# Patient Record
Sex: Female | Born: 1950 | Race: White | Hispanic: No | Marital: Married | Smoking: Never smoker
Health system: Southern US, Community
[De-identification: ages and names within clinical notes are randomized; demographics above are authoritative.]

## PROBLEM LIST (undated history)

## (undated) DIAGNOSIS — F419 Anxiety disorder, unspecified: Secondary | ICD-10-CM

## (undated) DIAGNOSIS — F32A Depression, unspecified: Secondary | ICD-10-CM

## (undated) DIAGNOSIS — IMO0001 Reserved for inherently not codable concepts without codable children: Secondary | ICD-10-CM

## (undated) DIAGNOSIS — M8580 Other specified disorders of bone density and structure, unspecified site: Secondary | ICD-10-CM

## (undated) DIAGNOSIS — F329 Major depressive disorder, single episode, unspecified: Secondary | ICD-10-CM

## (undated) DIAGNOSIS — E663 Overweight: Secondary | ICD-10-CM

## (undated) DIAGNOSIS — R059 Cough, unspecified: Secondary | ICD-10-CM

## (undated) DIAGNOSIS — R05 Cough: Secondary | ICD-10-CM

## (undated) DIAGNOSIS — M797 Fibromyalgia: Secondary | ICD-10-CM

## (undated) DIAGNOSIS — M722 Plantar fascial fibromatosis: Secondary | ICD-10-CM

## (undated) DIAGNOSIS — B009 Herpesviral infection, unspecified: Secondary | ICD-10-CM

## (undated) DIAGNOSIS — T4145XA Adverse effect of unspecified anesthetic, initial encounter: Secondary | ICD-10-CM

## (undated) DIAGNOSIS — M1990 Unspecified osteoarthritis, unspecified site: Secondary | ICD-10-CM

## (undated) DIAGNOSIS — E785 Hyperlipidemia, unspecified: Secondary | ICD-10-CM

## (undated) DIAGNOSIS — K219 Gastro-esophageal reflux disease without esophagitis: Secondary | ICD-10-CM

## (undated) DIAGNOSIS — R251 Tremor, unspecified: Secondary | ICD-10-CM

## (undated) DIAGNOSIS — T8859XA Other complications of anesthesia, initial encounter: Secondary | ICD-10-CM

## (undated) HISTORY — PX: LUMBAR FUSION: SHX111

## (undated) HISTORY — PX: WRIST SURGERY: SHX841

## (undated) HISTORY — DX: Overweight: E66.3

## (undated) HISTORY — PX: BACK SURGERY: SHX140

## (undated) HISTORY — PX: BUNIONECTOMY: SHX129

## (undated) HISTORY — DX: Hyperlipidemia, unspecified: E78.5

## (undated) HISTORY — DX: Anxiety disorder, unspecified: F41.9

## (undated) HISTORY — DX: Unspecified osteoarthritis, unspecified site: M19.90

## (undated) HISTORY — DX: Other specified disorders of bone density and structure, unspecified site: M85.80

## (undated) HISTORY — DX: Fibromyalgia: M79.7

## (undated) HISTORY — DX: Plantar fascial fibromatosis: M72.2

## (undated) HISTORY — PX: DILATION AND CURETTAGE OF UTERUS: SHX78

## (undated) HISTORY — PX: JOINT REPLACEMENT: SHX530

## (undated) HISTORY — DX: Gastro-esophageal reflux disease without esophagitis: K21.9

## (undated) HISTORY — DX: Depression, unspecified: F32.A

## (undated) HISTORY — DX: Major depressive disorder, single episode, unspecified: F32.9

---

## 2005-04-13 ENCOUNTER — Emergency Department: Payer: Self-pay | Admitting: Emergency Medicine

## 2005-06-12 ENCOUNTER — Ambulatory Visit: Payer: Self-pay | Admitting: General Practice

## 2005-11-20 HISTORY — PX: TOTAL KNEE ARTHROPLASTY: SHX125

## 2006-01-19 ENCOUNTER — Emergency Department: Payer: Self-pay | Admitting: Emergency Medicine

## 2006-07-25 ENCOUNTER — Inpatient Hospital Stay (HOSPITAL_COMMUNITY): Admission: RE | Admit: 2006-07-25 | Discharge: 2006-07-28 | Payer: Self-pay | Admitting: Orthopedic Surgery

## 2006-08-14 ENCOUNTER — Encounter: Payer: Self-pay | Admitting: Orthopedic Surgery

## 2006-08-20 ENCOUNTER — Encounter: Payer: Self-pay | Admitting: Orthopedic Surgery

## 2006-09-20 ENCOUNTER — Encounter: Payer: Self-pay | Admitting: Orthopedic Surgery

## 2009-03-15 ENCOUNTER — Ambulatory Visit: Payer: Self-pay

## 2011-03-09 ENCOUNTER — Ambulatory Visit: Payer: Self-pay | Admitting: Pain Medicine

## 2011-03-17 ENCOUNTER — Ambulatory Visit: Payer: Self-pay | Admitting: Pain Medicine

## 2011-03-22 ENCOUNTER — Ambulatory Visit: Payer: Self-pay | Admitting: Pain Medicine

## 2011-03-28 ENCOUNTER — Ambulatory Visit: Payer: Self-pay | Admitting: Pain Medicine

## 2011-04-10 ENCOUNTER — Ambulatory Visit: Payer: Self-pay | Admitting: Pain Medicine

## 2011-04-24 ENCOUNTER — Ambulatory Visit: Payer: Self-pay | Admitting: Nephrology

## 2011-04-28 ENCOUNTER — Ambulatory Visit: Payer: Self-pay | Admitting: Gastroenterology

## 2011-06-01 ENCOUNTER — Ambulatory Visit: Payer: Self-pay | Admitting: Pain Medicine

## 2011-06-21 ENCOUNTER — Ambulatory Visit: Payer: Self-pay | Admitting: Pain Medicine

## 2011-06-27 ENCOUNTER — Ambulatory Visit: Payer: Self-pay | Admitting: Pain Medicine

## 2011-07-17 ENCOUNTER — Ambulatory Visit: Payer: Self-pay | Admitting: Pain Medicine

## 2011-08-15 ENCOUNTER — Other Ambulatory Visit (HOSPITAL_COMMUNITY): Payer: Self-pay | Admitting: Neurosurgery

## 2011-08-15 ENCOUNTER — Ambulatory Visit (HOSPITAL_COMMUNITY)
Admission: RE | Admit: 2011-08-15 | Discharge: 2011-08-15 | Disposition: A | Discharge status: Home / Self Care | Payer: Medicare Other | Source: Ambulatory Visit | Attending: Neurosurgery | Admitting: Neurosurgery

## 2011-08-15 ENCOUNTER — Encounter (HOSPITAL_COMMUNITY)
Admission: RE | Admit: 2011-08-15 | Discharge: 2011-08-15 | Disposition: A | Discharge status: Home / Self Care | Payer: Medicare Other | Source: Ambulatory Visit | Attending: Neurosurgery | Admitting: Neurosurgery

## 2011-08-15 DIAGNOSIS — R059 Cough, unspecified: Secondary | ICD-10-CM | POA: Insufficient documentation

## 2011-08-15 DIAGNOSIS — Z01811 Encounter for preprocedural respiratory examination: Secondary | ICD-10-CM | POA: Insufficient documentation

## 2011-08-15 DIAGNOSIS — M5126 Other intervertebral disc displacement, lumbar region: Secondary | ICD-10-CM | POA: Insufficient documentation

## 2011-08-15 DIAGNOSIS — M4316 Spondylolisthesis, lumbar region: Secondary | ICD-10-CM

## 2011-08-15 DIAGNOSIS — M48061 Spinal stenosis, lumbar region without neurogenic claudication: Secondary | ICD-10-CM

## 2011-08-15 DIAGNOSIS — Z01812 Encounter for preprocedural laboratory examination: Secondary | ICD-10-CM | POA: Insufficient documentation

## 2011-08-15 DIAGNOSIS — Z01810 Encounter for preprocedural cardiovascular examination: Secondary | ICD-10-CM | POA: Insufficient documentation

## 2011-08-15 DIAGNOSIS — R05 Cough: Secondary | ICD-10-CM | POA: Insufficient documentation

## 2011-08-15 LAB — CBC
HCT: 42.3 % (ref 36.0–46.0)
MCH: 26.6 pg (ref 26.0–34.0)
MCV: 79.7 fL (ref 78.0–100.0)
RDW: 14.6 % (ref 11.5–15.5)
WBC: 7.2 10*3/uL (ref 4.0–10.5)

## 2011-08-15 LAB — BASIC METABOLIC PANEL
BUN: 13 mg/dL (ref 6–23)
CO2: 32 mEq/L (ref 19–32)
Chloride: 98 mEq/L (ref 96–112)
Creatinine, Ser: 0.84 mg/dL (ref 0.50–1.10)

## 2011-08-23 ENCOUNTER — Inpatient Hospital Stay (HOSPITAL_COMMUNITY): Payer: Medicare Other

## 2011-08-23 ENCOUNTER — Inpatient Hospital Stay (HOSPITAL_COMMUNITY)
Admission: RE | Admit: 2011-08-23 | Discharge: 2011-08-27 | DRG: 460 | Disposition: A | Discharge status: Home / Self Care | Payer: Medicare Other | Source: Ambulatory Visit | Attending: Neurosurgery | Admitting: Neurosurgery

## 2011-08-23 DIAGNOSIS — M5137 Other intervertebral disc degeneration, lumbosacral region: Secondary | ICD-10-CM | POA: Diagnosis present

## 2011-08-23 DIAGNOSIS — M4310 Spondylolisthesis, site unspecified: Principal | ICD-10-CM | POA: Diagnosis present

## 2011-08-23 DIAGNOSIS — IMO0001 Reserved for inherently not codable concepts without codable children: Secondary | ICD-10-CM | POA: Diagnosis present

## 2011-08-23 DIAGNOSIS — M51379 Other intervertebral disc degeneration, lumbosacral region without mention of lumbar back pain or lower extremity pain: Secondary | ICD-10-CM | POA: Diagnosis present

## 2011-08-23 LAB — TYPE AND SCREEN: Antibody Screen: NEGATIVE

## 2011-08-25 NOTE — Op Note (Signed)
  Angela Martinez, Angela Martinez                   ACCOUNT NO.:  [id number]  MEDICAL RECORD NO.:  [id number]  LOCATION:  3034                         FACILITY:  [facility]  PHYSICIAN:  Hilda Lias, M.D.   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  CLINICAL HISTORY:  The patient was in my office because of back pain, radiation to both legs.  X-rays showed degenerative disk disease at multiple levels, but she has unstable spondylolisthesis at the level L4- 5.  The patient was not any better.  She had failed all the conservative treatment.  Surgery was advised.  PROCEDURE IN DETAIL:  The patient was taken to the OR, after intubation, she was positioned in a prone manner.  The back was cleaned with DuraPrep.  Midline incision was made through the skin and subcutaneous tissue all the way to the muscle with retraction laterally.  X-rays showed one clip was there on L3 and the other was at the level of L4. From then on, we removed the spinous process of L4 at the lamina.  Also, the facet was removed with essentially a Gill procedure.  Once it was done, we had to drill laterally, more than normal what we do to be able to get into the disk space.  Incision was made in the lateral, on the right side and then on the left side, and total gross diskectomy was achieved.  The endplate was removed.  Then, 2 cages with autograft of Vitoss were inserted first on the right side and then on the left side. Good position of the cages were seen.  From then on, using the C-arm in AP view and then in lateral view, we probed the pedicle L4 and L5, and we were able to introduce  four screws of 5.5 x 45.  Prior to introducing the screws, we were able to fill the holes, yet to be sure that we were surrounded by bone.  The screws were connected with the rod and kept in place with caps.  The cross-link from left-to-right was done.  We went laterally, and we removed the periosteum of the lateral aspect of the facet at 4-5 as well as the transverse process of 4-5.  A mix of Vitoss and autograft was used for arthrodesis.  There was small area of arachnoid pouch and a piece of DuraGen was left on top.  Valsalva maneuver up to 50 was negative.  From then on, the area was irrigated and closed with Vicryl and Steri-Strips.          ______________________________ Hilda Lias, M.D.     EB/MEDQ  D:  08/23/2011  T:  08/24/2011  Job:  [id number]  Electronically Signed by Hilda Lias M.D. on 08/25/2011 06:26:36 PM

## 2011-09-12 NOTE — Discharge Summary (Signed)
  Angela Martinez, Angela Martinez                   ACCOUNT NO.:  [id number]  MEDICAL RECORD NO.:  [id number]  LOCATION:  3034                         FACILITY:  [facility]  PHYSICIAN:  Hilda Lias, M.D.   DATE OF BIRTH:  [birth year]  DATE OF ADMISSION:  08/23/2011 DATE OF DISCHARGE:  08/27/2011                              DISCHARGE SUMMARY   ADMISSION DIAGNOSIS:  Lumbar stenosis with L4-5 spondylolisthesis.  FINAL DIAGNOSIS:  Lumbar stenosis with L4-5 spondylolisthesis.  CLINICAL HISTORY:  [name] is a 60 year old female complaining of back pain with radiation to both legs.  Because of the finding, surgery was advised.  Laboratory normal.  COURSE IN THE HOSPITAL:  She was taken to surgery and decompressive laminectomy at L4-5 was done, followed by fusion using cages and pedicle screws.  The patient did well, was ambulating, and she was discharged by Dr. Colon Branch on October 7.  MEDICATIONS:  Percocet and diazepam.  DIET:  Regular.ACTIVITY:  Not to drive, not to do any heavy lifting.  FOLLOWUP:  She is to be seen by me in 3 weeks.          ______________________________ Hilda Lias, M.D.     EB/MEDQ  D:  09/04/2011  T:  09/05/2011  Job:  [id number]  Electronically Signed by Hilda Lias M.D. on 09/12/2011 11:30:48 AM

## 2015-05-26 ENCOUNTER — Telehealth: Payer: Self-pay | Admitting: Family Medicine

## 2015-05-26 MED ORDER — OMEPRAZOLE 20 MG PO CPDR: 20.0000 mg | DELAYED_RELEASE_CAPSULE | Freq: Every day | ORAL | Status: DC

## 2015-05-26 NOTE — Telephone Encounter (Signed)
Called to notify patient that she needed an appointment, she already has one scheduled for July 20,2016.

## 2015-05-26 NOTE — Telephone Encounter (Signed)
E-Fax came through for refill: Rx: Omeprazole 20mg  Copy of Rx in basket

## 2015-05-26 NOTE — Telephone Encounter (Signed)
Forward to Dr.Johnson. 

## 2015-06-08 DIAGNOSIS — I10 Essential (primary) hypertension: Secondary | ICD-10-CM | POA: Insufficient documentation

## 2015-06-08 DIAGNOSIS — E785 Hyperlipidemia, unspecified: Secondary | ICD-10-CM | POA: Insufficient documentation

## 2015-06-08 DIAGNOSIS — M8580 Other specified disorders of bone density and structure, unspecified site: Secondary | ICD-10-CM | POA: Insufficient documentation

## 2015-06-08 DIAGNOSIS — E669 Obesity, unspecified: Secondary | ICD-10-CM | POA: Insufficient documentation

## 2015-06-08 DIAGNOSIS — F329 Major depressive disorder, single episode, unspecified: Secondary | ICD-10-CM | POA: Insufficient documentation

## 2015-06-08 DIAGNOSIS — F419 Anxiety disorder, unspecified: Secondary | ICD-10-CM | POA: Insufficient documentation

## 2015-06-08 DIAGNOSIS — K219 Gastro-esophageal reflux disease without esophagitis: Secondary | ICD-10-CM | POA: Insufficient documentation

## 2015-06-08 DIAGNOSIS — M810 Age-related osteoporosis without current pathological fracture: Secondary | ICD-10-CM | POA: Insufficient documentation

## 2015-06-08 DIAGNOSIS — M1990 Unspecified osteoarthritis, unspecified site: Secondary | ICD-10-CM | POA: Insufficient documentation

## 2015-06-08 DIAGNOSIS — E663 Overweight: Secondary | ICD-10-CM | POA: Insufficient documentation

## 2015-06-08 DIAGNOSIS — M797 Fibromyalgia: Secondary | ICD-10-CM | POA: Insufficient documentation

## 2015-06-08 DIAGNOSIS — F320 Major depressive disorder, single episode, mild: Secondary | ICD-10-CM | POA: Insufficient documentation

## 2015-06-09 ENCOUNTER — Ambulatory Visit (INDEPENDENT_AMBULATORY_CARE_PROVIDER_SITE_OTHER): Payer: PPO | Admitting: Family Medicine

## 2015-06-09 ENCOUNTER — Encounter: Payer: Self-pay | Admitting: Family Medicine

## 2015-06-09 VITALS — BP 120/80 | HR 75 | Temp 97.9°F | Ht 63.8 in | Wt 171.0 lb

## 2015-06-09 DIAGNOSIS — F329 Major depressive disorder, single episode, unspecified: Secondary | ICD-10-CM

## 2015-06-09 DIAGNOSIS — Z0000 Encounter for general adult medical examination without abnormal findings: Secondary | ICD-10-CM

## 2015-06-09 DIAGNOSIS — I10 Essential (primary) hypertension: Secondary | ICD-10-CM | POA: Diagnosis not present

## 2015-06-09 DIAGNOSIS — K219 Gastro-esophageal reflux disease without esophagitis: Secondary | ICD-10-CM

## 2015-06-09 DIAGNOSIS — F32A Depression, unspecified: Secondary | ICD-10-CM

## 2015-06-09 LAB — MICROSCOPIC EXAMINATION

## 2015-06-09 LAB — URINALYSIS, ROUTINE W REFLEX MICROSCOPIC
Bilirubin, UA: NEGATIVE
Glucose, UA: NEGATIVE
KETONES UA: NEGATIVE
Nitrite, UA: NEGATIVE
PH UA: 6 (ref 5.0–7.5)
PROTEIN UA: NEGATIVE
RBC, UA: NEGATIVE
SPEC GRAV UA: 1.01 (ref 1.005–1.030)
UUROB: 0.2 mg/dL (ref 0.2–1.0)

## 2015-06-09 MED ORDER — GABAPENTIN 300 MG PO CAPS: 300.0000 mg | ORAL_CAPSULE | Freq: Two times a day (BID) | ORAL | Status: DC

## 2015-06-09 MED ORDER — VALACYCLOVIR HCL 500 MG PO TABS: 500.0000 mg | ORAL_TABLET | Freq: Two times a day (BID) | ORAL | Status: DC

## 2015-06-09 MED ORDER — OMEPRAZOLE 20 MG PO CPDR: 20.0000 mg | DELAYED_RELEASE_CAPSULE | Freq: Every day | ORAL | Status: DC

## 2015-06-09 MED ORDER — FLUOXETINE HCL 20 MG PO TABS: 20.0000 mg | ORAL_TABLET | Freq: Every day | ORAL | Status: DC

## 2015-06-09 NOTE — Assessment & Plan Note (Signed)
The current medical regimen is effective;  continue present plan and medications.  

## 2015-06-09 NOTE — Progress Notes (Signed)
BP 120/80 mmHg  Pulse 75  Temp(Src) 97.9 F (36.6 C)  Ht 5' 3.8" (1.621 m)  Wt 171 lb (77.565 kg)  BMI 29.52 kg/m2  SpO2 99%   Subjective:    Patient ID: Angela Martinez, female    DOB: 1951/04/06, 64 y.o.   MRN: [id number]  HPI: Angela Martinez is a 64 y.o. female  Chief Complaint  Patient presents with  . Annual Exam    concerns about lumps in her head especially behind her right ear also some of her bones hurt Like both elbows and shoulders. Hands. Seems to be worse when she gets up in the morning but often on all day. Takes arthritis strength Tylenol with some relief. Patient with some left facial tic symptoms like a lightning strike going into her left cheek area Dr. Tami Ribas at [facility]  gave her prednisone which she has not taken. Pain is about gone with just slight presence. Depression doing well with Prozac. Takes gabapentin from time to time wants another prescription Takes Prilosec for reflux and nausea which is well controlled.   Relevant past medical, surgical, family and social history reviewed and updated as indicated. Interim medical history since our last visit reviewed. Allergies and medications reviewed and updated.  Review of Systems  Constitutional: Negative.   HENT: Negative.   Eyes: Negative.   Respiratory: Negative.   Cardiovascular: Negative.   Gastrointestinal: Negative.   Endocrine: Negative.   Genitourinary: Negative.   Musculoskeletal: Negative.   Skin: Negative.   Allergic/Immunologic: Negative.   Neurological: Negative.   Hematological: Negative.   Psychiatric/Behavioral: Negative.     Per HPI unless specifically indicated above     Objective:    BP 120/80 mmHg  Pulse 75  Temp(Src) 97.9 F (36.6 C)  Ht 5' 3.8" (1.621 m)  Wt 171 lb (77.565 kg)  BMI 29.52 kg/m2  SpO2 99%  Wt Readings from Last 3 Encounters:  06/09/15 171 lb (77.565 kg)  05/29/14 171 lb (77.565 kg)    Physical Exam  Constitutional: She is oriented to person,  place, and time. She appears well-developed and well-nourished.  HENT:  Head: Normocephalic and atraumatic.  Right Ear: External ear normal.  Left Ear: External ear normal.  Nose: Nose normal.  Mouth/Throat: Oropharynx is clear and moist.  Eyes: Conjunctivae and EOM are normal. Pupils are equal, round, and reactive to light.  Neck: Normal range of motion. Neck supple. Carotid bruit is not present.  Cardiovascular: Normal rate, regular rhythm and normal heart sounds.   No murmur heard. Pulmonary/Chest: Effort normal and breath sounds normal. Right breast exhibits no mass and no tenderness. Left breast exhibits no mass and no tenderness. Breasts are symmetrical.  Abdominal: Soft. Bowel sounds are normal. There is no hepatosplenomegaly.  Musculoskeletal: Normal range of motion.  Neurological: She is alert and oriented to person, place, and time.  Skin: No rash noted.   Behind patient's ear is a small fleshy mole  Psychiatric: She has a normal mood and affect. Her behavior is normal. Judgment and thought content normal.        Assessment & Plan:   Problem List Items Addressed This Visit      Cardiovascular and Mediastinum   Hypertension - Primary    Diet controled        Digestive   GERD (gastroesophageal reflux disease)    The current medical regimen is effective;  continue present plan and medications.       Relevant Medications  docusate sodium (COLACE) 50 MG capsule   omeprazole (PRILOSEC) 20 MG capsule     Other   Depression    The current medical regimen is effective;  continue present plan and medications.       Relevant Medications   gabapentin (NEURONTIN) 300 MG capsule   FLUoxetine (PROZAC) 20 MG tablet    Other Visit Diagnoses    PE (physical exam), annual        Relevant Orders    Comprehensive metabolic panel    CBC with Differential/Platelet    Urinalysis, Routine w reflex microscopic (not at [facility])    TSH    Lipid panel        Follow up  plan: Return in about 6 months (around 12/10/2015), or if symptoms worsen or fail to improve, for med check.

## 2015-06-09 NOTE — Assessment & Plan Note (Signed)
Diet controled 

## 2015-06-10 LAB — COMPREHENSIVE METABOLIC PANEL
ALBUMIN: 4.3 g/dL (ref 3.6–4.8)
ALT: 13 IU/L (ref 0–32)
AST: 18 IU/L (ref 0–40)
Albumin/Globulin Ratio: 1.6 (ref 1.1–2.5)
Alkaline Phosphatase: 57 IU/L (ref 39–117)
BUN/Creatinine Ratio: 15 (ref 11–26)
BUN: 15 mg/dL (ref 8–27)
Bilirubin Total: 0.6 mg/dL (ref 0.0–1.2)
CALCIUM: 9.6 mg/dL (ref 8.7–10.3)
CHLORIDE: 101 mmol/L (ref 97–108)
CO2: 24 mmol/L (ref 18–29)
Creatinine, Ser: 0.97 mg/dL (ref 0.57–1.00)
GFR calc Af Amer: 70 mL/min/{1.73_m2} (ref >59)
GFR calc non Af Amer: 61 mL/min/{1.73_m2} (ref >59)
GLOBULIN, TOTAL: 2.7 g/dL (ref 1.5–4.5)
Glucose: 83 mg/dL (ref 65–99)
POTASSIUM: 4.5 mmol/L (ref 3.5–5.2)
SODIUM: 142 mmol/L (ref 134–144)
Total Protein: 7 g/dL (ref 6.0–8.5)

## 2015-06-10 LAB — CBC WITH DIFFERENTIAL/PLATELET
BASOS ABS: 0 10*3/uL (ref 0.0–0.2)
BASOS: 0 %
EOS (ABSOLUTE): 0.2 10*3/uL (ref 0.0–0.4)
Eos: 2 %
Hematocrit: 41.6 % (ref 34.0–46.6)
Hemoglobin: 13.6 g/dL (ref 11.1–15.9)
IMMATURE GRANS (ABS): 0 10*3/uL (ref 0.0–0.1)
Immature Granulocytes: 0 %
LYMPHS ABS: 2.4 10*3/uL (ref 0.7–3.1)
Lymphs: 30 %
MCH: 25.8 pg — AB (ref 26.6–33.0)
MCHC: 32.7 g/dL (ref 31.5–35.7)
MCV: 79 fL (ref 79–97)
MONOS ABS: 0.5 10*3/uL (ref 0.1–0.9)
Monocytes: 6 %
NEUTROS PCT: 62 %
Neutrophils Absolute: 4.9 10*3/uL (ref 1.4–7.0)
Platelets: 298 10*3/uL (ref 150–379)
RBC: 5.27 x10E6/uL (ref 3.77–5.28)
RDW: 16.4 % — AB (ref 12.3–15.4)
WBC: 8 10*3/uL (ref 3.4–10.8)

## 2015-06-10 LAB — LIPID PANEL
CHOLESTEROL TOTAL: 238 mg/dL — AB (ref 100–199)
Chol/HDL Ratio: 5.3 ratio units — ABNORMAL HIGH (ref 0.0–4.4)
HDL: 45 mg/dL (ref >39)
LDL Calculated: 152 mg/dL — ABNORMAL HIGH (ref 0–99)
Triglycerides: 205 mg/dL — ABNORMAL HIGH (ref 0–149)
VLDL Cholesterol Cal: 41 mg/dL — ABNORMAL HIGH (ref 5–40)

## 2015-06-10 LAB — TSH: TSH: 3.45 u[IU]/mL (ref 0.450–4.500)

## 2015-06-10 NOTE — Progress Notes (Signed)
Phone call Discussed with patient elevated lipids patient will do better with diet and exercise recheck lipid panel next office visit

## 2015-06-15 ENCOUNTER — Other Ambulatory Visit: Payer: Self-pay | Admitting: Unknown Physician Specialty

## 2015-06-15 DIAGNOSIS — M792 Neuralgia and neuritis, unspecified: Secondary | ICD-10-CM

## 2015-06-15 DIAGNOSIS — R51 Headache: Secondary | ICD-10-CM

## 2015-06-15 DIAGNOSIS — R519 Headache, unspecified: Secondary | ICD-10-CM

## 2015-06-18 ENCOUNTER — Ambulatory Visit: Payer: Self-pay

## 2015-06-18 ENCOUNTER — Ambulatory Visit
Admission: RE | Admit: 2015-06-18 | Discharge: 2015-06-18 | Disposition: A | Discharge status: Home / Self Care | Payer: PPO | Source: Ambulatory Visit | Attending: Unknown Physician Specialty | Admitting: Unknown Physician Specialty

## 2015-06-18 DIAGNOSIS — R519 Headache, unspecified: Secondary | ICD-10-CM

## 2015-06-18 DIAGNOSIS — R51 Headache: Secondary | ICD-10-CM | POA: Insufficient documentation

## 2015-06-18 DIAGNOSIS — M792 Neuralgia and neuritis, unspecified: Secondary | ICD-10-CM

## 2015-06-18 MED ORDER — GADOBENATE DIMEGLUMINE 529 MG/ML IV SOLN
20.0000 mL | Freq: Once | INTRAVENOUS | Status: AC | PRN
  Administered 2015-06-18: 16 mL via INTRAVENOUS

## 2015-12-16 DIAGNOSIS — H2513 Age-related nuclear cataract, bilateral: Secondary | ICD-10-CM | POA: Diagnosis not present

## 2015-12-27 ENCOUNTER — Ambulatory Visit (INDEPENDENT_AMBULATORY_CARE_PROVIDER_SITE_OTHER): Payer: PPO | Admitting: Family Medicine

## 2015-12-27 ENCOUNTER — Encounter: Payer: Self-pay | Admitting: Family Medicine

## 2015-12-27 VITALS — BP 139/77 | HR 77 | Temp 97.9°F | Ht 63.3 in | Wt 171.0 lb

## 2015-12-27 DIAGNOSIS — F329 Major depressive disorder, single episode, unspecified: Secondary | ICD-10-CM | POA: Diagnosis not present

## 2015-12-27 DIAGNOSIS — F419 Anxiety disorder, unspecified: Secondary | ICD-10-CM | POA: Diagnosis not present

## 2015-12-27 DIAGNOSIS — F32A Depression, unspecified: Secondary | ICD-10-CM

## 2015-12-27 DIAGNOSIS — I10 Essential (primary) hypertension: Secondary | ICD-10-CM | POA: Diagnosis not present

## 2015-12-27 DIAGNOSIS — G500 Trigeminal neuralgia: Secondary | ICD-10-CM

## 2015-12-27 NOTE — Assessment & Plan Note (Signed)
The current medical regimen is effective;  continue present plan and medications.  

## 2015-12-27 NOTE — Progress Notes (Signed)
BP 139/77 mmHg  Pulse 77  Temp(Src) 97.9 F (36.6 C)  Ht 5' 3.3" (1.608 m)  Wt 171 lb (77.565 kg)  BMI 30.00 kg/m2  SpO2 98%   Subjective:    Patient ID: Angela Martinez, female    DOB: [birth year], 65 y.o.   MRN: [id number]  HPI: Angela Martinez is a 65 y.o. female  Chief Complaint  Patient presents with  . Fibromyalgia  . Hypertension  . lesion on right upper arm   Patient all in all doing well follow-up medications taking gabapentin for what sounds like trigeminal neuralgia. Patient is well controlled on gabapentin tried stopping gabapentin and symptoms came right back. Takes gabapentin without side effects Takes fluoxetine for nerves anxiety which does good control especially according to her husband We will continue this medication\ Patient on her right arm small cyst which is been present for 15 years with no significant change slight redness over the area of cyst will observe for now Relevant past medical, surgical, family and social history reviewed and updated as indicated. Interim medical history since our last visit reviewed. Allergies and medications reviewed and updated.  Review of Systems  Constitutional: Negative.   Respiratory: Negative.   Cardiovascular: Negative.     Per HPI unless specifically indicated above     Objective:    BP 139/77 mmHg  Pulse 77  Temp(Src) 97.9 F (36.6 C)  Ht 5' 3.3" (1.608 m)  Wt 171 lb (77.565 kg)  BMI 30.00 kg/m2  SpO2 98%  Wt Readings from Last 3 Encounters:  12/27/15 171 lb (77.565 kg)  06/09/15 171 lb (77.565 kg)  05/29/14 171 lb (77.565 kg)    Physical Exam  Constitutional: She is oriented to person, place, and time. She appears well-developed and well-nourished. No distress.  HENT:  Head: Normocephalic and atraumatic.  Right Ear: Hearing normal.  Left Ear: Hearing normal.  Nose: Nose normal.  Eyes: Conjunctivae and lids are normal. Right eye exhibits no discharge. Left eye exhibits no discharge. No scleral  icterus.  Cardiovascular: Normal rate, regular rhythm and normal heart sounds.   Pulmonary/Chest: Effort normal and breath sounds normal. No respiratory distress.  Musculoskeletal: Normal range of motion.  Neurological: She is alert and oriented to person, place, and time.  Skin: Skin is intact. No rash noted.  Psychiatric: She has a normal mood and affect. Her speech is normal and behavior is normal. Judgment and thought content normal. Cognition and memory are normal.    Results for orders placed or performed in visit on 06/09/15  Microscopic Examination  Result Value Ref Range   WBC, UA 0-5 0 -  5 /hpf   RBC, UA 0-2 0 -  2 /hpf   Epithelial Cells (non renal) 0-10 0 - 10 /hpf   Bacteria, UA Few None seen/Few  Comprehensive metabolic panel  Result Value Ref Range   Glucose 83 65 - 99 mg/dL   BUN 15 8 - 27 mg/dL   Creatinine, Ser 0.97 0.57 - 1.00 mg/dL   GFR calc non Af Amer 61 >59 mL/min/1.73   GFR calc Af Amer 70 >59 mL/min/1.73   BUN/Creatinine Ratio 15 11 - 26   Sodium 142 134 - 144 mmol/L   Potassium 4.5 3.5 - 5.2 mmol/L   Chloride 101 97 - 108 mmol/L   CO2 24 18 - 29 mmol/L   Calcium 9.6 8.7 - 10.3 mg/dL   Total Protein 7.0 6.0 - 8.5 g/dL   Albumin 4.3 3.6 -  4.8 g/dL   Globulin, Total 2.7 1.5 - 4.5 g/dL   Albumin/Globulin Ratio 1.6 1.1 - 2.5   Bilirubin Total 0.6 0.0 - 1.2 mg/dL   Alkaline Phosphatase 57 39 - 117 IU/L   AST 18 0 - 40 IU/L   ALT 13 0 - 32 IU/L  CBC with Differential/Platelet  Result Value Ref Range   WBC 8.0 3.4 - 10.8 x10E3/uL   RBC 5.27 3.77 - 5.28 x10E6/uL   Hemoglobin 13.6 11.1 - 15.9 g/dL   Hematocrit 41.6 34.0 - 46.6 %   MCV 79 79 - 97 fL   MCH 25.8 (L) 26.6 - 33.0 pg   MCHC 32.7 31.5 - 35.7 g/dL   RDW 16.4 (H) 12.3 - 15.4 %   Platelets 298 150 - 379 x10E3/uL   Neutrophils 62 %   Lymphs 30 %   Monocytes 6 %   Eos 2 %   Basos 0 %   Neutrophils Absolute 4.9 1.4 - 7.0 x10E3/uL   Lymphocytes Absolute 2.4 0.7 - 3.1 x10E3/uL   Monocytes  Absolute 0.5 0.1 - 0.9 x10E3/uL   EOS (ABSOLUTE) 0.2 0.0 - 0.4 x10E3/uL   Basophils Absolute 0.0 0.0 - 0.2 x10E3/uL   Immature Granulocytes 0 %   Immature Grans (Abs) 0.0 0.0 - 0.1 x10E3/uL  Urinalysis, Routine w reflex microscopic (not at [facility])  Result Value Ref Range   Specific Gravity, UA 1.010 1.005 - 1.030   pH, UA 6.0 5.0 - 7.5   Color, UA Yellow Yellow   Appearance Ur Clear Clear   Leukocytes, UA Trace (A) Negative   Protein, UA Negative Negative/Trace   Glucose, UA Negative Negative   Ketones, UA Negative Negative   RBC, UA Negative Negative   Bilirubin, UA Negative Negative   Urobilinogen, Ur 0.2 0.2 - 1.0 mg/dL   Nitrite, UA Negative Negative   Microscopic Examination See below:   TSH  Result Value Ref Range   TSH 3.450 0.450 - 4.500 uIU/mL  Lipid panel  Result Value Ref Range   Cholesterol, Total 238 (H) 100 - 199 mg/dL   Triglycerides 205 (H) 0 - 149 mg/dL   HDL 45 >39 mg/dL   VLDL Cholesterol Cal 41 (H) 5 - 40 mg/dL   LDL Calculated 152 (H) 0 - 99 mg/dL   Chol/HDL Ratio 5.3 (H) 0.0 - 4.4 ratio units      Assessment & Plan:   Problem List Items Addressed This Visit      Cardiovascular and Mediastinum   Essential hypertension - Primary    The current medical regimen is effective;  continue present plan and medications.         Nervous and Auditory   Trigeminal neuralgia of left side of face    The current medical regimen is effective;  continue present plan and medications.         Other   Depression    The current medical regimen is effective;  continue present plan and medications.       Anxiety    The current medical regimen is effective;  continue present plan and medications.           Follow up plan: Return in about 6 months (around 06/25/2016) for Physical Exam.

## 2016-02-15 ENCOUNTER — Telehealth: Payer: Self-pay | Admitting: Family Medicine

## 2016-02-15 ENCOUNTER — Other Ambulatory Visit: Payer: Self-pay

## 2016-02-15 DIAGNOSIS — Z1239 Encounter for other screening for malignant neoplasm of breast: Secondary | ICD-10-CM

## 2016-02-15 NOTE — Telephone Encounter (Signed)
Pt would like a order for mammogram

## 2016-02-17 DIAGNOSIS — H2513 Age-related nuclear cataract, bilateral: Secondary | ICD-10-CM | POA: Diagnosis not present

## 2016-02-23 DIAGNOSIS — Z1231 Encounter for screening mammogram for malignant neoplasm of breast: Secondary | ICD-10-CM | POA: Diagnosis not present

## 2016-02-23 LAB — HM MAMMOGRAPHY

## 2016-03-09 DIAGNOSIS — H2513 Age-related nuclear cataract, bilateral: Secondary | ICD-10-CM | POA: Diagnosis not present

## 2016-03-13 ENCOUNTER — Encounter: Payer: Self-pay | Admitting: *Deleted

## 2016-03-16 ENCOUNTER — Ambulatory Visit
Admission: RE | Admit: 2016-03-16 | Discharge: 2016-03-16 | Disposition: A | Discharge status: Home / Self Care | Payer: PPO | Source: Ambulatory Visit | Attending: Ophthalmology | Admitting: Ophthalmology

## 2016-03-16 ENCOUNTER — Encounter: Payer: Self-pay | Admitting: *Deleted

## 2016-03-16 ENCOUNTER — Encounter
Admission: RE | Disposition: A | Discharge status: Home / Self Care | Payer: Self-pay | Source: Ambulatory Visit | Attending: Ophthalmology

## 2016-03-16 ENCOUNTER — Ambulatory Visit: Payer: PPO | Admitting: Anesthesiology

## 2016-03-16 DIAGNOSIS — R251 Tremor, unspecified: Secondary | ICD-10-CM | POA: Insufficient documentation

## 2016-03-16 DIAGNOSIS — G500 Trigeminal neuralgia: Secondary | ICD-10-CM | POA: Diagnosis not present

## 2016-03-16 DIAGNOSIS — R0602 Shortness of breath: Secondary | ICD-10-CM | POA: Diagnosis not present

## 2016-03-16 DIAGNOSIS — H2511 Age-related nuclear cataract, right eye: Secondary | ICD-10-CM | POA: Insufficient documentation

## 2016-03-16 DIAGNOSIS — M797 Fibromyalgia: Secondary | ICD-10-CM | POA: Diagnosis not present

## 2016-03-16 DIAGNOSIS — M810 Age-related osteoporosis without current pathological fracture: Secondary | ICD-10-CM | POA: Insufficient documentation

## 2016-03-16 DIAGNOSIS — K219 Gastro-esophageal reflux disease without esophagitis: Secondary | ICD-10-CM | POA: Insufficient documentation

## 2016-03-16 DIAGNOSIS — R05 Cough: Secondary | ICD-10-CM | POA: Diagnosis not present

## 2016-03-16 DIAGNOSIS — F329 Major depressive disorder, single episode, unspecified: Secondary | ICD-10-CM | POA: Insufficient documentation

## 2016-03-16 DIAGNOSIS — I10 Essential (primary) hypertension: Secondary | ICD-10-CM | POA: Insufficient documentation

## 2016-03-16 DIAGNOSIS — M8580 Other specified disorders of bone density and structure, unspecified site: Secondary | ICD-10-CM | POA: Insufficient documentation

## 2016-03-16 DIAGNOSIS — Z96659 Presence of unspecified artificial knee joint: Secondary | ICD-10-CM | POA: Diagnosis not present

## 2016-03-16 DIAGNOSIS — E669 Obesity, unspecified: Secondary | ICD-10-CM | POA: Insufficient documentation

## 2016-03-16 DIAGNOSIS — E7800 Pure hypercholesterolemia, unspecified: Secondary | ICD-10-CM | POA: Insufficient documentation

## 2016-03-16 DIAGNOSIS — H2513 Age-related nuclear cataract, bilateral: Secondary | ICD-10-CM | POA: Diagnosis not present

## 2016-03-16 HISTORY — PX: CATARACT EXTRACTION W/PHACO: SHX586

## 2016-03-16 HISTORY — DX: Tremor, unspecified: R25.1

## 2016-03-16 HISTORY — DX: Reserved for inherently not codable concepts without codable children: IMO0001

## 2016-03-16 HISTORY — DX: Herpesviral infection, unspecified: B00.9

## 2016-03-16 SURGERY — PHACOEMULSIFICATION, CATARACT, WITH IOL INSERTION
Anesthesia: Monitor Anesthesia Care | Site: Eye | Laterality: Right | Wound class: Clean

## 2016-03-16 MED ORDER — HYALURONIDASE HUMAN 150 UNIT/ML IJ SOLN
INTRAMUSCULAR | Status: AC
  Filled 2016-03-16: qty 1

## 2016-03-16 MED ORDER — SODIUM CHLORIDE 0.9 % IV SOLN
INTRAVENOUS | Status: DC
  Administered 2016-03-16: 09:00:00 via INTRAVENOUS

## 2016-03-16 MED ORDER — POVIDONE-IODINE 5 % OP SOLN
OPHTHALMIC | Status: AC
  Filled 2016-03-16: qty 30

## 2016-03-16 MED ORDER — CEFUROXIME OPHTHALMIC INJECTION 1 MG/0.1 ML
INJECTION | OPHTHALMIC | Status: DC | PRN
Start: 2016-03-16 — End: 2016-03-16
  Administered 2016-03-16: 0.1 mL via INTRACAMERAL

## 2016-03-16 MED ORDER — LIDOCAINE HCL (PF) 4 % IJ SOLN
INTRAMUSCULAR | Status: AC
Start: 2016-03-16 — End: 2016-03-16
  Filled 2016-03-16: qty 5

## 2016-03-16 MED ORDER — CEFUROXIME OPHTHALMIC INJECTION 1 MG/0.1 ML
INJECTION | OPHTHALMIC | Status: AC
  Filled 2016-03-16: qty 0.1

## 2016-03-16 MED ORDER — NA HYALUR & NA CHOND-NA HYALUR 0.55-0.5 ML IO KIT
PACK | INTRAOCULAR | Status: AC
  Filled 2016-03-16: qty 1.05

## 2016-03-16 MED ORDER — TETRACAINE HCL 0.5 % OP SOLN
1.0000 [drp] | Freq: Once | OPHTHALMIC | Status: AC
  Administered 2016-03-16: 1 [drp] via OPHTHALMIC

## 2016-03-16 MED ORDER — ARMC OPHTHALMIC DILATING GEL
1.0000 "application " | OPHTHALMIC | Status: DC | PRN
  Administered 2016-03-16: 1 via OPHTHALMIC

## 2016-03-16 MED ORDER — BUPIVACAINE HCL (PF) 0.75 % IJ SOLN
INTRAMUSCULAR | Status: AC
  Filled 2016-03-16: qty 10

## 2016-03-16 MED ORDER — EPINEPHRINE HCL 1 MG/ML IJ SOLN
INTRAMUSCULAR | Status: AC
  Filled 2016-03-16: qty 1

## 2016-03-16 MED ORDER — TETRACAINE HCL 0.5 % OP SOLN
OPHTHALMIC | Status: AC
  Filled 2016-03-16: qty 2

## 2016-03-16 MED ORDER — MIDAZOLAM HCL 2 MG/2ML IJ SOLN
INTRAMUSCULAR | Status: DC | PRN
  Administered 2016-03-16: 1 mg via INTRAVENOUS

## 2016-03-16 MED ORDER — MOXIFLOXACIN HCL 0.5 % OP SOLN
OPHTHALMIC | Status: AC
  Filled 2016-03-16: qty 3

## 2016-03-16 MED ORDER — CARBACHOL 0.01 % IO SOLN
INTRAOCULAR | Status: DC | PRN
  Administered 2016-03-16: 0.5 mL via INTRAOCULAR

## 2016-03-16 MED ORDER — EPINEPHRINE HCL 1 MG/ML IJ SOLN
INTRAMUSCULAR | Status: DC | PRN
  Administered 2016-03-16: 10:00:00 via OPHTHALMIC

## 2016-03-16 MED ORDER — NEOMYCIN-POLYMYXIN-DEXAMETH 0.1 % OP OINT
TOPICAL_OINTMENT | OPHTHALMIC | Status: DC | PRN
  Administered 2016-03-16: 1 via OPHTHALMIC

## 2016-03-16 MED ORDER — POVIDONE-IODINE 5 % OP SOLN
1.0000 "application " | Freq: Once | OPHTHALMIC | Status: AC
  Administered 2016-03-16: 1 via OPHTHALMIC

## 2016-03-16 MED ORDER — LIDOCAINE HCL (PF) 4 % IJ SOLN
INTRAMUSCULAR | Status: AC
  Filled 2016-03-16: qty 5

## 2016-03-16 MED ORDER — ARMC OPHTHALMIC DILATING GEL
OPHTHALMIC | Status: AC
  Filled 2016-03-16: qty 0.25

## 2016-03-16 MED ORDER — NA HYALUR & NA CHOND-NA HYALUR 0.4-0.35 ML IO KIT
PACK | INTRAOCULAR | Status: DC | PRN
  Administered 2016-03-16: .35 mL via INTRAOCULAR

## 2016-03-16 MED ORDER — MOXIFLOXACIN HCL 0.5 % OP SOLN: 1.0000 [drp] | OPHTHALMIC | Status: DC | PRN

## 2016-03-16 MED ORDER — LIDOCAINE HCL (PF) 4 % IJ SOLN
INTRAOCULAR | Status: DC | PRN
  Administered 2016-03-16: 10:00:00 via OPHTHALMIC

## 2016-03-16 SURGICAL SUPPLY — 23 items
CANNULA ANT/CHMB 27G (MISCELLANEOUS) ×1 IMPLANT
CANNULA ANT/CHMB 27GA (MISCELLANEOUS) ×2 IMPLANT
CUP MEDICINE 2OZ PLAST GRAD ST (MISCELLANEOUS) ×2 IMPLANT
GLOVE BIO SURGEON STRL SZ8 (GLOVE) ×2 IMPLANT
GLOVE BIOGEL M 6.5 STRL (GLOVE) ×2 IMPLANT
GLOVE SURG LX 7.5 STRW (GLOVE) ×1
GLOVE SURG LX STRL 7.5 STRW (GLOVE) ×1 IMPLANT
GOWN STRL REUS W/ TWL LRG LVL3 (GOWN DISPOSABLE) ×2 IMPLANT
GOWN STRL REUS W/TWL LRG LVL3 (GOWN DISPOSABLE) ×4
LENS IOL TECNIS 20.0 (Intraocular Lens) ×2 IMPLANT
LENS IOL TECNIS MONO 1P 20.0 (Intraocular Lens) IMPLANT
PACK CATARACT (MISCELLANEOUS) ×2 IMPLANT
PACK CATARACT BRASINGTON LX (MISCELLANEOUS) ×2 IMPLANT
PACK EYE AFTER SURG (MISCELLANEOUS) ×2 IMPLANT
SOL BSS BAG (MISCELLANEOUS) ×2
SOL PREP PVP 2OZ (MISCELLANEOUS) ×2
SOLUTION BSS BAG (MISCELLANEOUS) ×1 IMPLANT
SOLUTION PREP PVP 2OZ (MISCELLANEOUS) ×1 IMPLANT
SYR 3ML LL SCALE MARK (SYRINGE) ×2 IMPLANT
SYR 5ML LL (SYRINGE) ×2 IMPLANT
SYR TB 1ML 27GX1/2 LL (SYRINGE) ×2 IMPLANT
WATER STERILE IRR 1000ML POUR (IV SOLUTION) ×2 IMPLANT
WIPE NON LINTING 3.25X3.25 (MISCELLANEOUS) ×2 IMPLANT

## 2016-03-16 NOTE — Transfer of Care (Signed)
Immediate Anesthesia Transfer of Care Note  Patient: Angela Martinez  Procedure(s) Performed: Procedure(s) with comments: CATARACT EXTRACTION PHACO AND INTRAOCULAR LENS PLACEMENT (IOC) (Right) - [place] 52.4 AP% 8.8 CDE 4.63 Fluid Pack Lot # [id number]  Patient Location: Short Stay  Anesthesia Type:MAC  Level of Consciousness: awake, alert  and oriented  Airway & Oxygen Therapy: Patient Spontanous Breathing and Patient connected to nasal cannula oxygen  Post-op Assessment: Report given to RN and Post -op Vital signs reviewed and stable  Post vital signs: Reviewed and stable  Last Vitals: 1012 - 97.8 temp 66 hr 100% sat 153/51 18 resp  Filed Vitals:   03/16/16 0826 03/16/16 1011  BP: 151/82 153/51  Pulse: 72 70  Temp: 36.7 C 36.6 C  Resp: 16 16    Last Pain: There were no vitals filed for this visit.       Complications: No apparent anesthesia complications

## 2016-03-16 NOTE — Discharge Instructions (Signed)
AMBULATORY SURGERY  DISCHARGE INSTRUCTIONS   1) The drugs that you were given will stay in your system until tomorrow so for the next 24 hours you should not:  A) Drive an automobile B) Make any legal decisions C) Drink any alcoholic beverage   2) You may resume regular meals tomorrow.  Today it is better to start with liquids and gradually work up to solid foods.  You may eat anything you prefer, but it is better to start with liquids, then soup and crackers, and gradually work up to solid foods.   3) Please notify your doctor immediately if you have any unusual bleeding, trouble breathing, redness and pain at the surgery site, drainage, fever, or pain not relieved by medication.    4) Additional Instructions:        Please contact your physician with any problems or Same Day Surgery at [phone], Monday through Friday 6 am to 4 pm, or [place] [facility] number at [phone].    Eye Surgery Discharge Instructions  Expect mild scratchy sensation or mild soreness. DO NOT RUB YOUR EYE!  The day of surgery:  Minimal physical activity, but bed rest is not required  No reading, computer work, or close hand work  No bending, lifting, or straining.  May watch TV  For 24 hours:  No driving, legal decisions, or alcoholic beverages  Safety precautions  Eat anything you prefer: It is better to start with liquids, then soup then solid foods.  _____ Eye patch should be worn until postoperative exam tomorrow.  ____ Solar shield eyeglasses should be worn for comfort in the sunlight/patch while sleeping  Resume all regular medications including aspirin or Coumadin if these were discontinued prior to surgery. You may shower, bathe, shave, or wash your hair. Tylenol may be taken for mild discomfort.  Call your doctor if you experience significant pain, nausea, or vomiting, fever > 101 or other signs of infection. [phone] or [phone] Specific  instructions:  Follow-up Information    Follow up with Leandrew Koyanagi, MD In 1 day.   Specialty:  Ophthalmology   Why:  Nicholaus Corolla at 10:45am   Contact information:   [address]   [place] [address] [phone]

## 2016-03-16 NOTE — Anesthesia Postprocedure Evaluation (Signed)
Anesthesia Post Note  Patient: Eilish J Quam  Procedure(s) Performed: Procedure(s) (LRB): CATARACT EXTRACTION PHACO AND INTRAOCULAR LENS PLACEMENT (IOC) (Right)  Patient location during evaluation: Short Stay Anesthesia Type: MAC Level of consciousness: awake and alert and oriented Pain management: pain level controlled Vital Signs Assessment: post-procedure vital signs reviewed and stable Respiratory status: spontaneous breathing Cardiovascular status: stable Postop Assessment: no signs of nausea or vomiting Anesthetic complications: no    Last Vitals:  Filed Vitals:   03/16/16 0826 03/16/16 1011  BP: 151/82 153/51  Pulse: 72 70  Temp: 36.7 C 36.6 C  Resp: 16 16    Last Pain: There were no vitals filed for this visit.               Delaney Meigs

## 2016-03-16 NOTE — Anesthesia Preprocedure Evaluation (Signed)
Anesthesia Evaluation  Patient identified by MRN, date of birth, ID band Patient awake    Reviewed: Allergy & Precautions, H&P , NPO status , Patient's Chart, lab work & pertinent test results, reviewed documented beta blocker date and time   History of Anesthesia Complications Negative for: history of anesthetic complications  Airway Mallampati: III  TM Distance: >3 FB Neck ROM: full    Dental no notable dental hx. (+) Caps, Missing, Teeth Intact   Pulmonary neg pulmonary ROS,    Pulmonary exam normal breath sounds clear to auscultation       Cardiovascular Exercise Tolerance: Good hypertension, (-) angina(-) CAD, (-) Past MI, (-) Cardiac Stents and (-) CABG Normal cardiovascular exam(-) dysrhythmias (-) Valvular Problems/Murmurs Rhythm:regular Rate:Normal     Neuro/Psych PSYCHIATRIC DISORDERS (Depression)  Neuromuscular disease (fibromyalgia and trigeminal neuralgia)    GI/Hepatic Neg liver ROS, GERD  ,  Endo/Other  negative endocrine ROS  Renal/GU negative Renal ROS  negative genitourinary   Musculoskeletal   Abdominal   Peds  Hematology negative hematology ROS (+)   Anesthesia Other Findings Past Medical History:   Fibromyalgia                                                 OA (osteoarthritis)                                          Osteopenia                                                   Hyperlipidemia                                               Overweight                                                   GERD (gastroesophageal reflux disease)                       Anxiety                                                      Depression                                                   Plantar fasciitis  Shortness of breath dyspnea                                    Comment:DOE   Hypertension                                                 Tremors of nervous  system                                    Herpes simplex                                                 Comment:1 AND 2   Reproductive/Obstetrics negative OB ROS                             Anesthesia Physical Anesthesia Plan  ASA: II  Anesthesia Plan: MAC   Post-op Pain Management:    Induction:   Airway Management Planned:   Additional Equipment:   Intra-op Plan:   Post-operative Plan:   Informed Consent: I have reviewed the patients History and Physical, chart, labs and discussed the procedure including the risks, benefits and alternatives for the proposed anesthesia with the patient or authorized representative who has indicated his/her understanding and acceptance.   Dental Advisory Given  Plan Discussed with: Anesthesiologist, CRNA and Surgeon  Anesthesia Plan Comments:         Anesthesia Quick Evaluation

## 2016-03-16 NOTE — H&P (Signed)
  The History and Physical notes are on paper, have been signed, and are to be scanned. The patient remains stable and unchanged from the H&P.   Previous H&P reviewed, patient examined, and there are no changes.  Angela Martinez 03/16/2016 8:42 AM

## 2016-03-16 NOTE — Op Note (Signed)
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 Item Serial No. Manufacturer Lot No. LRB No. Used  LENS IOL TECNIS 20.0 - [id number] Intraocular Lens LENS IOL TECNIS 20.0 [id number]   Right 1       ULTRASOUND TIME: 9 %  of 0 minutes 52 seconds, CDE 4.6  SURGEON:  Wyonia Hough, MD   ANESTHESIA:  Topical with tetracaine drops and 2% Xylocaine jelly, augmented with 1% preservative-free intracameral lidocaine.    COMPLICATIONS:  None.   DESCRIPTION OF PROCEDURE:  The patient was identified in the holding room and transported to the operating room and placed in the supine position under the operating microscope. Theright eye was identified as the operative eye and it was prepped and draped in the usual sterile ophthalmic fashion.   A 1 millimeter clear-corneal paracentesis was made at the 12:00 position.  0.5 ml of preservative-free 1% lidocaine was injected into the anterior chamber. The anterior chamber was filled with Viscoat viscoelastic.  A 2.4 millimeter keratome was used to make a near-clear corneal incision at the 9:00 position. A curvilinear capsulorrhexis was made with a cystotome and capsulorrhexis forceps.  Balanced salt solution was used to hydrodissect and hydrodelineate the nucleus.   Phacoemulsification was then used in stop and chop fashion to remove the lens nucleus and epinucleus.  The remaining cortex was then removed using the irrigation and aspiration handpiece. Provisc was then placed into the capsular bag to distend it for lens placement.  A lens was then injected into the capsular bag.  The remaining viscoelastic was aspirated.  Wounds were hydrated with balanced salt  solution.  The anterior chamber was inflated to a physiologic pressure with balanced salt solution. Cefuroxime 0.1 ml of a 10mg /ml solution was injected into the anterior chamber for a dose of 1 mg of intracameral antibiotic at the completion of the case. Miostat was placed into the anterior chamber to constrict the pupil.  No wound leaks were noted.  Topical Vigamox drops and Maxitrol ointment were applied to the eye.  The patient was taken to the recovery room in stable condition without complications of anesthesia or surgery.  Kensli Bowley 03/16/2016, 10:07 AM

## 2016-04-12 ENCOUNTER — Encounter: Payer: Self-pay | Admitting: Ophthalmology

## 2016-05-10 DIAGNOSIS — H2512 Age-related nuclear cataract, left eye: Secondary | ICD-10-CM | POA: Diagnosis not present

## 2016-05-15 ENCOUNTER — Encounter: Payer: Self-pay | Admitting: *Deleted

## 2016-05-18 ENCOUNTER — Encounter: Payer: Self-pay | Admitting: *Deleted

## 2016-05-18 ENCOUNTER — Ambulatory Visit: Payer: PPO | Admitting: Anesthesiology

## 2016-05-18 ENCOUNTER — Encounter
Admission: RE | Disposition: A | Discharge status: Home / Self Care | Payer: Self-pay | Source: Ambulatory Visit | Attending: Ophthalmology

## 2016-05-18 ENCOUNTER — Ambulatory Visit
Admission: RE | Admit: 2016-05-18 | Discharge: 2016-05-18 | Disposition: A | Discharge status: Home / Self Care | Payer: PPO | Source: Ambulatory Visit | Attending: Ophthalmology | Admitting: Ophthalmology

## 2016-05-18 DIAGNOSIS — K219 Gastro-esophageal reflux disease without esophagitis: Secondary | ICD-10-CM | POA: Diagnosis not present

## 2016-05-18 DIAGNOSIS — G500 Trigeminal neuralgia: Secondary | ICD-10-CM | POA: Diagnosis not present

## 2016-05-18 DIAGNOSIS — F329 Major depressive disorder, single episode, unspecified: Secondary | ICD-10-CM | POA: Diagnosis not present

## 2016-05-18 DIAGNOSIS — E785 Hyperlipidemia, unspecified: Secondary | ICD-10-CM | POA: Diagnosis not present

## 2016-05-18 DIAGNOSIS — H2512 Age-related nuclear cataract, left eye: Secondary | ICD-10-CM | POA: Diagnosis not present

## 2016-05-18 DIAGNOSIS — G709 Myoneural disorder, unspecified: Secondary | ICD-10-CM | POA: Diagnosis not present

## 2016-05-18 DIAGNOSIS — F418 Other specified anxiety disorders: Secondary | ICD-10-CM | POA: Diagnosis not present

## 2016-05-18 DIAGNOSIS — E663 Overweight: Secondary | ICD-10-CM | POA: Insufficient documentation

## 2016-05-18 DIAGNOSIS — M797 Fibromyalgia: Secondary | ICD-10-CM | POA: Insufficient documentation

## 2016-05-18 DIAGNOSIS — I10 Essential (primary) hypertension: Secondary | ICD-10-CM | POA: Insufficient documentation

## 2016-05-18 DIAGNOSIS — M1990 Unspecified osteoarthritis, unspecified site: Secondary | ICD-10-CM | POA: Insufficient documentation

## 2016-05-18 DIAGNOSIS — F419 Anxiety disorder, unspecified: Secondary | ICD-10-CM | POA: Insufficient documentation

## 2016-05-18 HISTORY — DX: Cough, unspecified: R05.9

## 2016-05-18 HISTORY — DX: Other complications of anesthesia, initial encounter: T88.59XA

## 2016-05-18 HISTORY — PX: CATARACT EXTRACTION W/PHACO: SHX586

## 2016-05-18 HISTORY — DX: Adverse effect of unspecified anesthetic, initial encounter: T41.45XA

## 2016-05-18 HISTORY — DX: Cough: R05

## 2016-05-18 SURGERY — PHACOEMULSIFICATION, CATARACT, WITH IOL INSERTION
Anesthesia: Monitor Anesthesia Care | Site: Eye | Laterality: Left | Wound class: Clean

## 2016-05-18 MED ORDER — POVIDONE-IODINE 5 % OP SOLN
1.0000 "application " | Freq: Once | OPHTHALMIC | Status: AC
  Administered 2016-05-18: 1 via OPHTHALMIC

## 2016-05-18 MED ORDER — EPINEPHRINE HCL 1 MG/ML IJ SOLN
INTRAMUSCULAR | Status: AC
  Filled 2016-05-18: qty 1

## 2016-05-18 MED ORDER — MOXIFLOXACIN HCL 0.5 % OP SOLN: 1.0000 [drp] | OPHTHALMIC | Status: DC | PRN

## 2016-05-18 MED ORDER — CARBACHOL 0.01 % IO SOLN
INTRAOCULAR | Status: DC | PRN
  Administered 2016-05-18: 0.5 mL via INTRAOCULAR

## 2016-05-18 MED ORDER — POVIDONE-IODINE 5 % OP SOLN
OPHTHALMIC | Status: AC
  Administered 2016-05-18: 1 via OPHTHALMIC
  Filled 2016-05-18: qty 30

## 2016-05-18 MED ORDER — EPINEPHRINE HCL 1 MG/ML IJ SOLN
INTRAMUSCULAR | Status: DC | PRN
  Administered 2016-05-18: 08:00:00 via OPHTHALMIC

## 2016-05-18 MED ORDER — TETRACAINE HCL 0.5 % OP SOLN
OPHTHALMIC | Status: AC
  Administered 2016-05-18: 1 [drp] via OPHTHALMIC
  Filled 2016-05-18: qty 2

## 2016-05-18 MED ORDER — NEOMYCIN-POLYMYXIN-DEXAMETH 3.5-10000-0.1 OP OINT
TOPICAL_OINTMENT | OPHTHALMIC | Status: AC
  Filled 2016-05-18: qty 3.5

## 2016-05-18 MED ORDER — SODIUM CHLORIDE 0.9 % IV SOLN
INTRAVENOUS | Status: DC
  Administered 2016-05-18: 07:00:00 via INTRAVENOUS

## 2016-05-18 MED ORDER — CEFUROXIME OPHTHALMIC INJECTION 1 MG/0.1 ML
INJECTION | OPHTHALMIC | Status: DC | PRN
Start: 2016-05-18 — End: 2016-05-18
  Administered 2016-05-18: 0.1 mL via INTRACAMERAL

## 2016-05-18 MED ORDER — TETRACAINE HCL 0.5 % OP SOLN
1.0000 [drp] | OPHTHALMIC | Status: AC | PRN
  Administered 2016-05-18: 1 [drp] via OPHTHALMIC

## 2016-05-18 MED ORDER — CEFUROXIME OPHTHALMIC INJECTION 1 MG/0.1 ML
INJECTION | OPHTHALMIC | Status: AC
  Filled 2016-05-18: qty 0.1

## 2016-05-18 MED ORDER — MOXIFLOXACIN HCL 0.5 % OP SOLN
OPHTHALMIC | Status: AC
  Filled 2016-05-18: qty 3

## 2016-05-18 MED ORDER — MIDAZOLAM HCL 2 MG/2ML IJ SOLN
INTRAMUSCULAR | Status: DC | PRN
  Administered 2016-05-18: 1.5 mg via INTRAVENOUS

## 2016-05-18 MED ORDER — NA HYALUR & NA CHOND-NA HYALUR 0.55-0.5 ML IO KIT
PACK | INTRAOCULAR | Status: AC
  Filled 2016-05-18: qty 1.05

## 2016-05-18 MED ORDER — NA HYALUR & NA CHOND-NA HYALUR 0.4-0.35 ML IO KIT
PACK | INTRAOCULAR | Status: DC | PRN
Start: 2016-05-18 — End: 2016-05-18
  Administered 2016-05-18: .35 mL via INTRAOCULAR

## 2016-05-18 MED ORDER — BSS IO SOLN
INTRAOCULAR | Status: DC | PRN
  Administered 2016-05-18: 08:00:00 via OPHTHALMIC

## 2016-05-18 MED ORDER — LIDOCAINE HCL (PF) 4 % IJ SOLN
INTRAMUSCULAR | Status: AC
  Filled 2016-05-18: qty 5

## 2016-05-18 MED ORDER — BUPIVACAINE HCL (PF) 0.75 % IJ SOLN
INTRAMUSCULAR | Status: AC
  Filled 2016-05-18: qty 10

## 2016-05-18 MED ORDER — ARMC OPHTHALMIC DILATING GEL
OPHTHALMIC | Status: AC
  Administered 2016-05-18: 1 via OPHTHALMIC
  Filled 2016-05-18: qty 0.25

## 2016-05-18 MED ORDER — HYALURONIDASE HUMAN 150 UNIT/ML IJ SOLN
INTRAMUSCULAR | Status: AC
  Filled 2016-05-18: qty 1

## 2016-05-18 MED ORDER — ARMC OPHTHALMIC DILATING GEL
1.0000 "application " | OPHTHALMIC | Status: AC | PRN
  Administered 2016-05-18 (×2): 1 via OPHTHALMIC

## 2016-05-18 MED ORDER — NEOMYCIN-POLYMYXIN-DEXAMETH 0.1 % OP OINT
TOPICAL_OINTMENT | OPHTHALMIC | Status: DC | PRN
  Administered 2016-05-18: 1 via OPHTHALMIC

## 2016-05-18 SURGICAL SUPPLY — 22 items
CANNULA ANT/CHMB 27G (MISCELLANEOUS) ×1 IMPLANT
CANNULA ANT/CHMB 27GA (MISCELLANEOUS) ×2 IMPLANT
CUP MEDICINE 2OZ PLAST GRAD ST (MISCELLANEOUS) ×2 IMPLANT
GLOVE BIO SURGEON STRL SZ8 (GLOVE) ×2 IMPLANT
GLOVE BIOGEL M 6.5 STRL (GLOVE) ×2 IMPLANT
GLOVE SURG LX 7.5 STRW (GLOVE) ×1
GLOVE SURG LX STRL 7.5 STRW (GLOVE) ×1 IMPLANT
GOWN STRL REUS W/ TWL LRG LVL3 (GOWN DISPOSABLE) ×2 IMPLANT
GOWN STRL REUS W/TWL LRG LVL3 (GOWN DISPOSABLE) ×4
LENS IOL TECNIS ITEC 22.5 (Intraocular Lens) ×1 IMPLANT
PACK CATARACT (MISCELLANEOUS) ×2 IMPLANT
PACK CATARACT BRASINGTON LX (MISCELLANEOUS) ×2 IMPLANT
PACK EYE AFTER SURG (MISCELLANEOUS) ×2 IMPLANT
SOL BSS BAG (MISCELLANEOUS) ×2
SOL PREP PVP 2OZ (MISCELLANEOUS) ×2
SOLUTION BSS BAG (MISCELLANEOUS) ×1 IMPLANT
SOLUTION PREP PVP 2OZ (MISCELLANEOUS) ×1 IMPLANT
SYR 3ML LL SCALE MARK (SYRINGE) ×2 IMPLANT
SYR 5ML LL (SYRINGE) ×2 IMPLANT
SYR TB 1ML 27GX1/2 LL (SYRINGE) ×2 IMPLANT
WATER STERILE IRR 1000ML POUR (IV SOLUTION) ×2 IMPLANT
WIPE NON LINTING 3.25X3.25 (MISCELLANEOUS) ×2 IMPLANT

## 2016-05-18 NOTE — Discharge Instructions (Signed)
AMBULATORY SURGERY  DISCHARGE INSTRUCTIONS   1) The drugs that you were given will stay in your system until tomorrow so for the next 24 hours you should not:  A) Drive an automobile B) Make any legal decisions C) Drink any alcoholic beverage   2) You may resume regular meals tomorrow.  Today it is better to start with liquids and gradually work up to solid foods.  You may eat anything you prefer, but it is better to start with liquids, then soup and crackers, and gradually work up to solid foods.   3) Please notify your doctor immediately if you have any unusual bleeding, trouble breathing, redness and pain at the surgery site, drainage, fever, or pain not relieved by medication.    4) Additional Instructions:    Eye Surgery Discharge Instructions  Expect mild scratchy sensation or mild soreness. DO NOT RUB YOUR EYE!  The day of surgery:  Minimal physical activity, but bed rest is not required  No reading, computer work, or close hand work  No bending, lifting, or straining.  May watch TV  For 24 hours:  No driving, legal decisions, or alcoholic beverages  Safety precautions  Eat anything you prefer: It is better to start with liquids, then soup then solid foods.  _____ Eye patch should be worn until postoperative exam tomorrow.  ____ Solar shield eyeglasses should be worn for comfort in the sunlight/patch while sleeping  Resume all regular medications including aspirin or Coumadin if these were discontinued prior to surgery. You may shower, bathe, shave, or wash your hair. Tylenol may be taken for mild discomfort.  Call your doctor if you experience significant pain, nausea, or vomiting, fever > 101 or other signs of infection. [phone] or [phone] Specific instructions:  Follow-up Information    Follow up with Leandrew Koyanagi, MD.   Specialty:  Ophthalmology   Why:  June 29 at 3:40pm   Contact information:   [address]   [place] [address] [phone]         Please contact your physician with any problems or Same Day Surgery at [phone], Monday through Friday 6 am to 4 pm, or [name] [facility] number at [phone].

## 2016-05-18 NOTE — Transfer of Care (Signed)
Immediate Anesthesia Transfer of Care Note  Patient: Angela Martinez  Procedure(s) Performed: Procedure(s) with comments: CATARACT EXTRACTION PHACO AND INTRAOCULAR LENS PLACEMENT (IOC) (Left) - [place] 47.3 AP% 10.4 CDE 4.90 Fluid pack lot # [id number]  Patient Location: PACU  Anesthesia Type:MAC  Level of Consciousness: awake  Airway & Oxygen Therapy: Patient Spontanous Breathing  Post-op Assessment: Report given to RN and Post -op Vital signs reviewed and stable  Post vital signs: Reviewed and stable  Last Vitals:  Filed Vitals:   05/18/16 0702  BP: 146/85  Pulse: 70  Temp: 35.6 C  Resp: 18    Last Pain: There were no vitals filed for this visit.       Complications: No apparent anesthesia complications

## 2016-05-18 NOTE — Anesthesia Postprocedure Evaluation (Signed)
Anesthesia Post Note  Patient: Angela Martinez  Procedure(s) Performed: Procedure(s) (LRB): CATARACT EXTRACTION PHACO AND INTRAOCULAR LENS PLACEMENT (IOC) (Left)  Patient location during evaluation: PACU Anesthesia Type: MAC Level of consciousness: awake Pain management: pain level controlled Vital Signs Assessment: post-procedure vital signs reviewed and stable Respiratory status: spontaneous breathing Cardiovascular status: blood pressure returned to baseline Postop Assessment: no headache Anesthetic complications: no    Last Vitals:  Filed Vitals:   05/18/16 0702  BP: 146/85  Pulse: 70  Temp: 35.6 C  Resp: 18    Last Pain: There were no vitals filed for this visit.               Buckner Malta

## 2016-05-18 NOTE — Op Note (Signed)
OPERATIVE NOTE  Angela Martinez [id number] 05/18/2016   PREOPERATIVE DIAGNOSIS:  Nuclear sclerotic cataract left eye. H25.12   POSTOPERATIVE DIAGNOSIS:    Nuclear sclerotic cataract left eye.     PROCEDURE:  Phacoemusification with posterior chamber intraocular lens placement of the left eye   LENS:   Implant Name Type Inv. Item Serial No. Manufacturer Lot No. LRB No. Used  LENS IOL DIOP 22.5 - [id number] Intraocular Lens LENS IOL DIOP 22.5 [id number]   Left 1        ULTRASOUND TIME: 10 % of 0 minutes, 47 seconds.  CDE 4.9   SURGEON:  Wyonia Hough, MD   ANESTHESIA:  Topical with tetracaine drops and 2% Xylocaine jelly, augmented with 1% preservative-free intracameral lidocaine.    COMPLICATIONS:  None.   DESCRIPTION OF PROCEDURE:  The patient was identified in the holding room and transported to the operating room and placed in the supine position under the operating microscope.  The left eye was identified as the operative eye and it was prepped and draped in the usual sterile ophthalmic fashion.   A 1 millimeter clear-corneal paracentesis was made at the 1:30 position. 0.5 ml of preservative-free 1% lidocaine was injected into the anterior chamber.  The anterior chamber was filled with Viscoat viscoelastic.  A 2.4 millimeter keratome was used to make a near-clear corneal incision at the 10:30 position.  .  A curvilinear capsulorrhexis was made with a cystotome and capsulorrhexis forceps.  Balanced salt solution was used to hydrodissect and hydrodelineate the nucleus.   Phacoemulsification was then used in stop and chop fashion to remove the lens nucleus and epinucleus.  The remaining cortex was then removed using the irrigation and aspiration handpiece. Provisc was then placed into the capsular bag to distend it for lens placement.  A lens was then injected into the capsular bag.  The remaining viscoelastic was aspirated.   Wounds were hydrated with balanced salt  solution.  The anterior chamber was inflated to a physiologic pressure with balanced salt solution. Cefuroxime 0.1 ml of a 10mg /ml solution was injected into the anterior chamber for a dose of 1 mg of intracameral antibiotic at the completion of the case.  Miostat was placed into the anterior chamber to constrict the pupil.  No wound leaks were noted.  Topical Vigamox drops and Maxitrol ointment were applied to the eye.  The patient was taken to the recovery room in stable condition without complications of anesthesia or surgery  [name] 05/18/2016, 8:28 AM

## 2016-05-18 NOTE — Anesthesia Preprocedure Evaluation (Signed)
Anesthesia Evaluation  Patient identified by MRN, date of birth, ID band Patient awake    Reviewed: Allergy & Precautions, H&P , NPO status , Patient's Chart, lab work & pertinent test results, reviewed documented beta blocker date and time   History of Anesthesia Complications Negative for: history of anesthetic complications  Airway Mallampati: III  TM Distance: >3 FB Neck ROM: limited    Dental no notable dental hx. (+) Caps, Missing, Teeth Intact   Pulmonary neg pulmonary ROS,    Pulmonary exam normal breath sounds clear to auscultation       Cardiovascular Exercise Tolerance: Good hypertension, (-) angina(-) CAD, (-) Past MI, (-) Cardiac Stents and (-) CABG Normal cardiovascular exam(-) dysrhythmias (-) Valvular Problems/Murmurs Rhythm:regular Rate:Normal     Neuro/Psych PSYCHIATRIC DISORDERS (Depression) Anxiety Depression  Neuromuscular disease (fibromyalgia and trigeminal neuralgia)    GI/Hepatic Neg liver ROS, GERD  Controlled,  Endo/Other  negative endocrine ROS  Renal/GU negative Renal ROS  negative genitourinary   Musculoskeletal  (+) Arthritis , Fibromyalgia -  Abdominal   Peds  Hematology negative hematology ROS (+)   Anesthesia Other Findings Past Medical History:   Fibromyalgia                                                 OA (osteoarthritis)                                          Osteopenia                                                   Hyperlipidemia                                               Overweight                                                   GERD (gastroesophageal reflux disease)                       Anxiety                                                      Depression                                                   Plantar fasciitis  Shortness of breath dyspnea                                    Comment:DOE   Hypertension                                                  Tremors of nervous system                                    Herpes simplex                                                 Comment:1 AND 2   Reproductive/Obstetrics negative OB ROS                             Anesthesia Physical  Anesthesia Plan  ASA: III  Anesthesia Plan: MAC   Post-op Pain Management:    Induction:   Airway Management Planned:   Additional Equipment:   Intra-op Plan:   Post-operative Plan:   Informed Consent: I have reviewed the patients History and Physical, chart, labs and discussed the procedure including the risks, benefits and alternatives for the proposed anesthesia with the patient or authorized representative who has indicated his/her understanding and acceptance.   Dental Advisory Given  Plan Discussed with: Anesthesiologist, CRNA and Surgeon  Anesthesia Plan Comments:         Anesthesia Quick Evaluation

## 2016-05-18 NOTE — H&P (Signed)
  The History and Physical notes are on paper, have been signed, and are to be scanned. The patient remains stable and unchanged from the H&P.   Previous H&P reviewed, patient examined, and there are no changes.  Angela Martinez 05/18/2016 7:59 AM

## 2016-05-18 NOTE — Anesthesia Procedure Notes (Signed)
Procedure Name: MAC Date/Time: 05/18/2016 7:46 AM Performed by: Allean Found Pre-anesthesia Checklist: Patient identified, Emergency Drugs available, Suction available, Patient being monitored and Timeout performed Patient Re-evaluated:Patient Re-evaluated prior to inductionOxygen Delivery Method: Nasal cannula Intubation Type: IV induction

## 2016-05-24 MED FILL — Moxifloxacin HCl Ophth Soln 0.5% (Base Equiv): OPHTHALMIC | Qty: 3 | Status: AC

## 2016-05-30 IMAGING — MR MR HEAD WO/W CM
13 series · 44 of 48 positions shown · IV contrast (multihance)
Comparison: None.

CLINICAL DATA: Headache and left-sided sinus and facial pain which
began after dental cleaning 1 month ago. Pain radiates to the left
cheek, nose and thigh.

EXAM:
MRI HEAD WITHOUT AND WITH CONTRAST
TECHNIQUE: Multiplanar, multiecho pulse sequences of the brain and surrounding
structures were obtained without and with intravenous contrast.
CONTRAST:  16mL MULTIHANCE GADOBENATE DIMEGLUMINE 529 MG/ML IV SOLN

[Series 2: T1 · sagittal · 5.0mm · 0.45mm/px · 4 of 29 slices shown (1 of 2)]
[im 1/29]
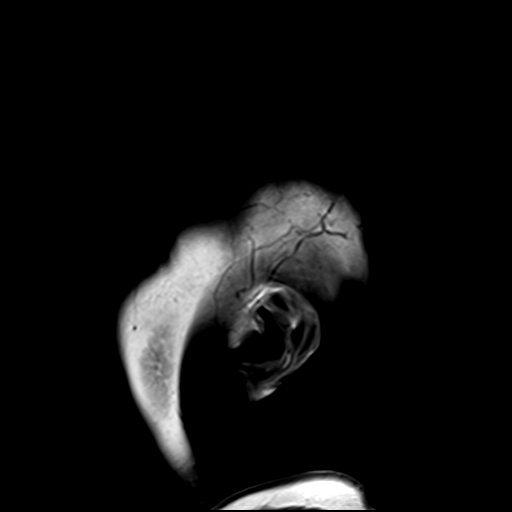
[im 10/29]
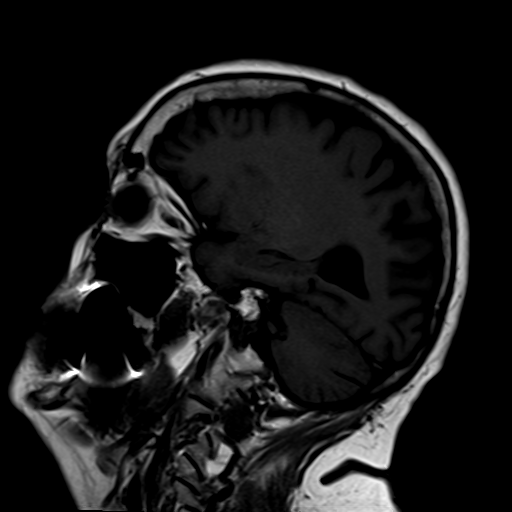
[im 19/29]
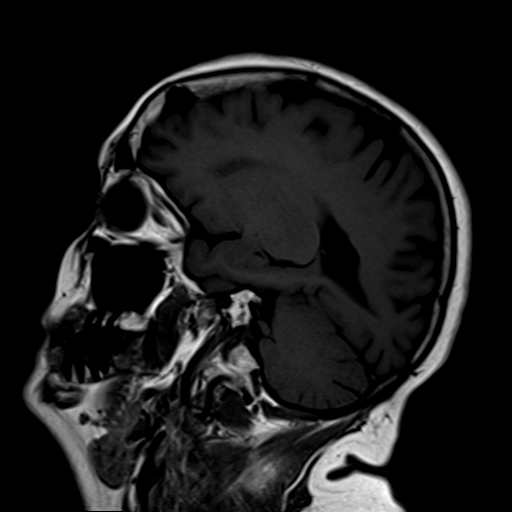
[im 29/29]
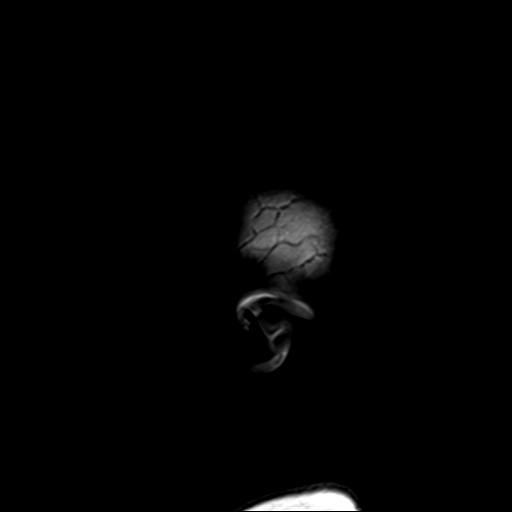

[Series 4: DWI · axial · 3.0mm · 1.80mm/px · z∈[-43,+103]mm · 6 of 56 slices shown (1 of 4)]
[im 1/56]
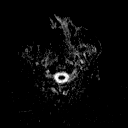
[im 12/56]
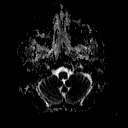
[im 23/56]
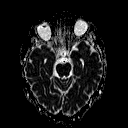
[im 34/56]
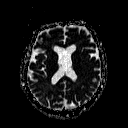
[im 45/56]
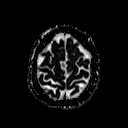
[im 56/56]
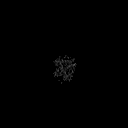

[Series 6: DWI · coronal · 3.0mm · 1.80mm/px · 4 of 47 slices shown (2 of 4)]
[im 1/47]
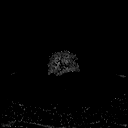
[im 16/47]
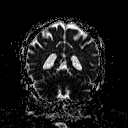
[im 31/47]
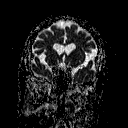
[im 47/47]
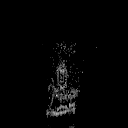

[Series 7: T2 · axial · 5.0mm · 0.60mm/px · z∈[-50,+106]mm · 3 of 28 slices shown (1 of 2)]
[im 1/28]
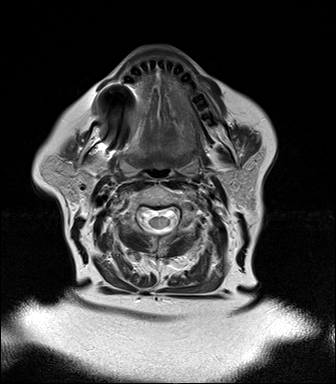
[im 14/28]
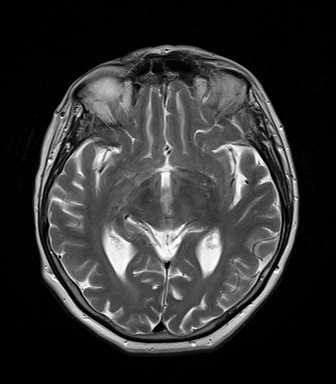
[im 28/28]
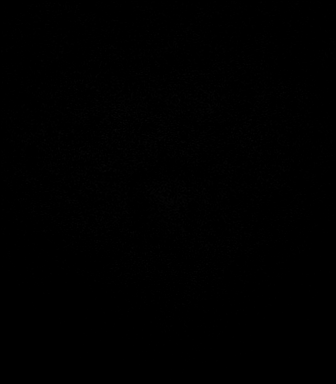

[Series 8: FLAIR · axial · 5.0mm · 0.45mm/px · z∈[-29,+107]mm · 2 of 26 slices shown (1 of 2)]
[im 1/26]
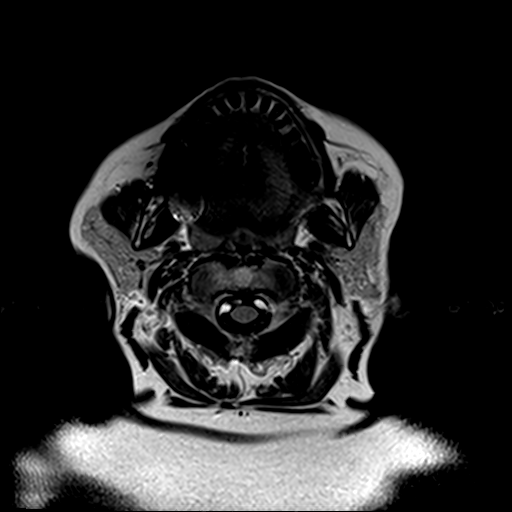
[im 26/26]
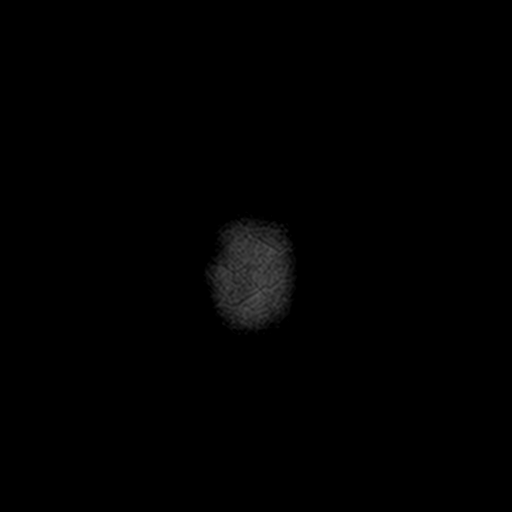

[Series 9: T2 · axial · 5.0mm · 0.45mm/px · z∈[-29,+107]mm · 2 of 26 slices shown (2 of 2)]
[im 1/26]
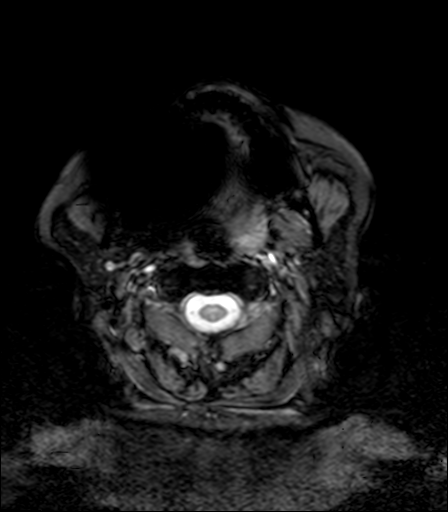
[im 26/26]
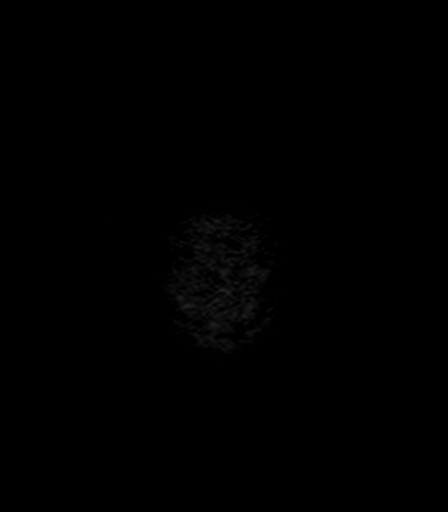

[Series 10: FLAIR · sagittal · 5.0mm · 0.43mm/px · 2 of 26 slices shown (2 of 2)]
[im 1/26]
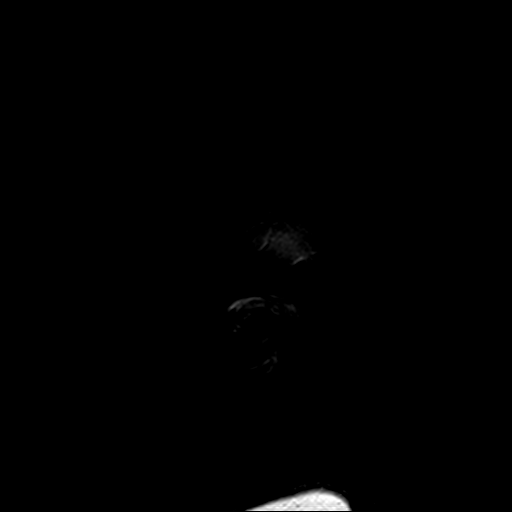
[im 26/26]
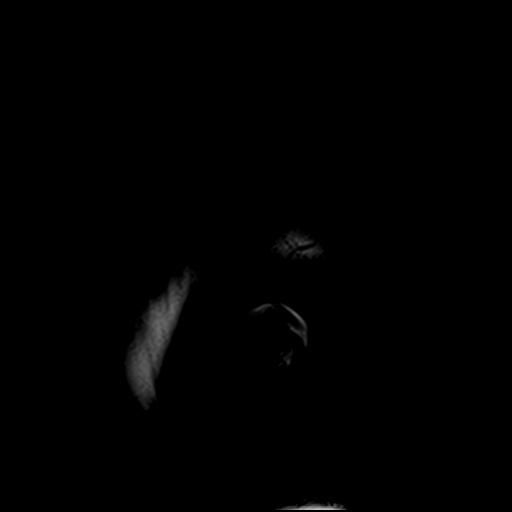

[Series 11: T1 · axial · 3.0mm · 1.00mm/px · 1 of 52 slices shown (2 of 2)]
[im 1/52]
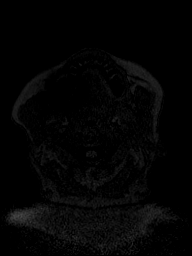

[Series 12: T2 post-contrast · coronal · 5.0mm · 0.49mm/px · 3 of 29 slices shown]
[im 1/29]
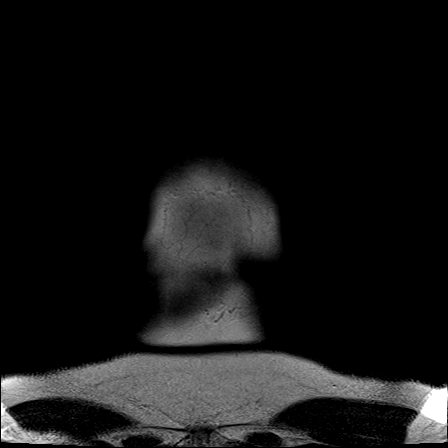
[im 15/29]
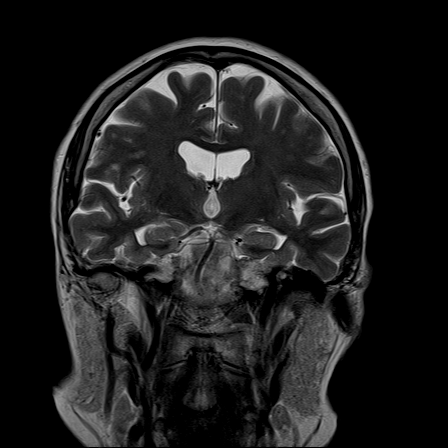
[im 29/29]
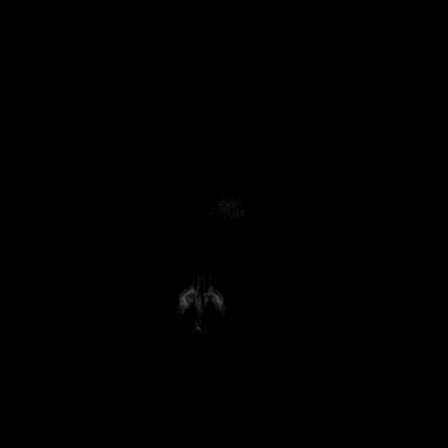

[Series 13: T1 post-contrast · axial · 3.0mm · 1.00mm/px · z∈[-16,+111]mm · 5 of 52 slices shown (1 of 2)]
[im 1/52]
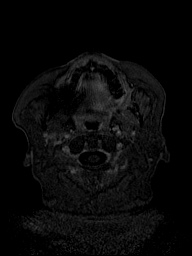
[im 13/52]
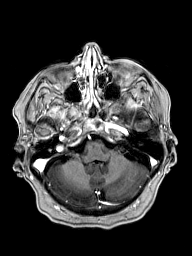
[im 26/52]
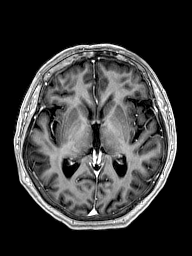
[im 39/52]
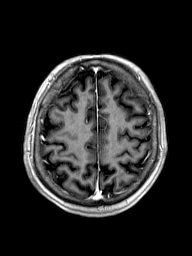
[im 52/52]
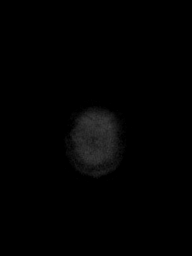

[Series 14: T1 post-contrast · coronal · 5.0mm · 0.43mm/px · 3 of 29 slices shown (2 of 2)]
[im 1/29]
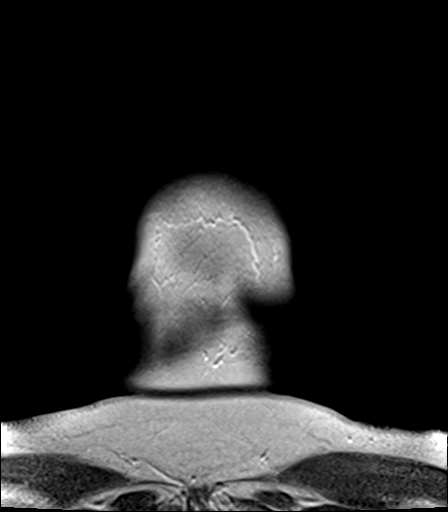
[im 15/29]
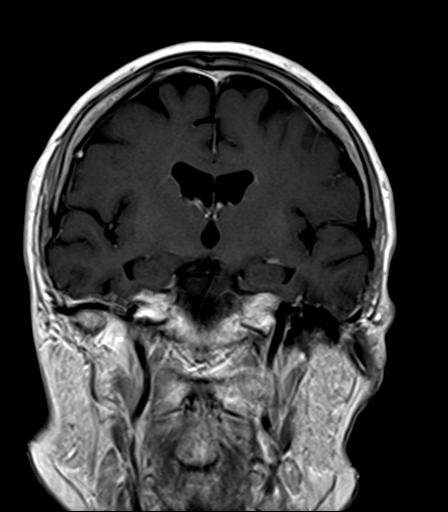
[im 29/29]
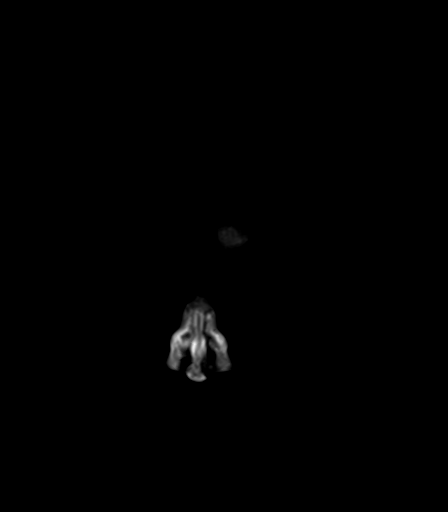

[Series 100: DWI · axial · 3.0mm · 1.80mm/px · z∈[-43,+103]mm · 5 of 56 slices shown (3 of 4)]
[im 1/56]
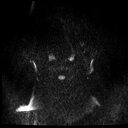
[im 14/56]
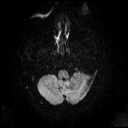
[im 28/56]
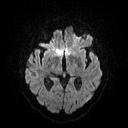
[im 42/56]
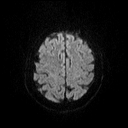
[im 56/56]
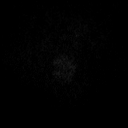

[Series 101: DWI · coronal · 3.0mm · 1.80mm/px · 4 of 46 slices shown (4 of 4)]
[im 1/46]
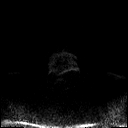
[im 16/46]
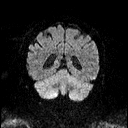
[im 31/46]
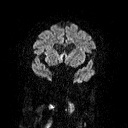
[im 46/46]
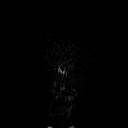

[44 of 48 positions shown; findings below may reference images not displayed]

FINDINGS: Diffusion imaging does not show any acute or subacute infarction.
There is no abnormality of the brainstem or cerebellum. Within the
cerebral hemispheres, there are a few scattered punctate foci of T2
and FLAIR signal in the white matter consistent with minimal small
vessel change, usually subclinical at this level. No cortical or
large vessel territory infarction. No mass lesion, hemorrhage,
hydrocephalus or extra-axial collection. No skull or skullbase
lesion. No fluid in the sinuses, middle ears or mastoids. Major
vessels at the base of the brain show flow. After contrast
administration, no abnormal enhancement occurs.
IMPRESSION: No acute or reversible finding. Mild scattered foci of T2 signal
within the hemispheric white matter, probably subclinical and most
likely to represent an early manifestation of small vessel change.
No evidence of sinus disease or skullbase lesion to explain the
left-sided pain.

## 2016-07-04 ENCOUNTER — Encounter: Payer: Self-pay | Admitting: Family Medicine

## 2016-07-04 ENCOUNTER — Ambulatory Visit (INDEPENDENT_AMBULATORY_CARE_PROVIDER_SITE_OTHER): Payer: PPO | Admitting: Family Medicine

## 2016-07-04 VITALS — BP 109/76 | HR 78 | Temp 97.7°F | Ht 64.0 in | Wt 172.0 lb

## 2016-07-04 DIAGNOSIS — F329 Major depressive disorder, single episode, unspecified: Secondary | ICD-10-CM | POA: Diagnosis not present

## 2016-07-04 DIAGNOSIS — M797 Fibromyalgia: Secondary | ICD-10-CM

## 2016-07-04 DIAGNOSIS — I10 Essential (primary) hypertension: Secondary | ICD-10-CM | POA: Diagnosis not present

## 2016-07-04 DIAGNOSIS — Z0000 Encounter for general adult medical examination without abnormal findings: Secondary | ICD-10-CM | POA: Diagnosis not present

## 2016-07-04 DIAGNOSIS — K219 Gastro-esophageal reflux disease without esophagitis: Secondary | ICD-10-CM

## 2016-07-04 DIAGNOSIS — Z13820 Encounter for screening for osteoporosis: Secondary | ICD-10-CM

## 2016-07-04 DIAGNOSIS — F32A Depression, unspecified: Secondary | ICD-10-CM

## 2016-07-04 LAB — URINALYSIS, ROUTINE W REFLEX MICROSCOPIC
BILIRUBIN UA: NEGATIVE
Glucose, UA: NEGATIVE
Ketones, UA: NEGATIVE
Leukocytes, UA: NEGATIVE
Nitrite, UA: NEGATIVE
PH UA: 7 (ref 5.0–7.5)
PROTEIN UA: NEGATIVE
RBC, UA: NEGATIVE
Specific Gravity, UA: 1.01 (ref 1.005–1.030)
Urobilinogen, Ur: 0.2 mg/dL (ref 0.2–1.0)

## 2016-07-04 MED ORDER — FLUOXETINE HCL 20 MG PO TABS: 20.0000 mg | ORAL_TABLET | Freq: Every day | ORAL | 12 refills | Status: DC

## 2016-07-04 MED ORDER — OMEPRAZOLE 20 MG PO CPDR: 20.0000 mg | DELAYED_RELEASE_CAPSULE | Freq: Every day | ORAL | 12 refills | Status: DC

## 2016-07-04 MED ORDER — GABAPENTIN 300 MG PO CAPS: 300.0000 mg | ORAL_CAPSULE | Freq: Two times a day (BID) | ORAL | 12 refills | Status: DC

## 2016-07-04 NOTE — Assessment & Plan Note (Signed)
The current medical regimen is effective;  continue present plan and medications.  

## 2016-07-04 NOTE — Addendum Note (Signed)
Addended by: Wynn Maudlin on: 07/04/2016 08:57 AM   Modules accepted: Orders

## 2016-07-04 NOTE — Progress Notes (Signed)
BP 109/76 (BP Location: Left Arm, Patient Position: Sitting, Cuff Size: Normal)   Pulse 78   Temp 97.7 F (36.5 C)   Ht '5\' 4"'  (1.626 m)   Wt 172 lb (78 kg)   SpO2 97%   BMI 29.52 kg/m    Subjective:    Patient ID: Angela Martinez, female    DOB: 06-Jan-1951, 65 y.o.   MRN: [id number]  HPI: Angela Martinez is a 65 y.o. female  Chief Complaint  Patient presents with  . Annual Exam  Patient follow-up for physical all in all doing well. AWV metrics met Depression anxiety nerves stable with no side effects or issues with fluoxetine Gabapentin taking 1 or 2 a day helps control some of her neuropathy symptoms and helps with pain. Prilosec helps control reflux symptoms Otherwise does well with over-the-counter medications and supplements   Relevant past medical, surgical, family and social history reviewed and updated as indicated. Interim medical history since our last visit reviewed. Allergies and medications reviewed and updated.  Review of Systems  Constitutional: Negative.   HENT: Negative.   Eyes: Negative.   Respiratory: Negative.   Cardiovascular: Negative.   Gastrointestinal: Negative.   Endocrine: Negative.   Genitourinary: Negative.   Musculoskeletal: Negative.   Skin: Negative.   Allergic/Immunologic: Negative.   Neurological: Negative.   Hematological: Negative.   Psychiatric/Behavioral: Negative.     Per HPI unless specifically indicated above     Objective:    BP 109/76 (BP Location: Left Arm, Patient Position: Sitting, Cuff Size: Normal)   Pulse 78   Temp 97.7 F (36.5 C)   Ht '5\' 4"'  (1.626 m)   Wt 172 lb (78 kg)   SpO2 97%   BMI 29.52 kg/m   Wt Readings from Last 3 Encounters:  07/04/16 172 lb (78 kg)  05/18/16 168 lb (76.2 kg)  03/16/16 171 lb (77.6 kg)    Physical Exam  Constitutional: She is oriented to person, place, and time. She appears well-developed and well-nourished.  HENT:  Head: Normocephalic and atraumatic.  Right Ear: External ear  normal.  Left Ear: External ear normal.  Nose: Nose normal.  Mouth/Throat: Oropharynx is clear and moist.  Eyes: Conjunctivae and EOM are normal. Pupils are equal, round, and reactive to light.  Neck: Normal range of motion. Neck supple. Carotid bruit is not present.  Cardiovascular: Normal rate, regular rhythm and normal heart sounds.   No murmur heard. Pulmonary/Chest: Effort normal and breath sounds normal. She exhibits no mass. Right breast exhibits no mass, no skin change and no tenderness. Left breast exhibits no mass, no skin change and no tenderness. Breasts are symmetrical.  Abdominal: Soft. Bowel sounds are normal. There is no hepatosplenomegaly.  Musculoskeletal: Normal range of motion.  Neurological: She is alert and oriented to person, place, and time.  Skin: No rash noted.  Psychiatric: She has a normal mood and affect. Her behavior is normal. Judgment and thought content normal.    Results for orders placed or performed in visit on 07/03/16  HM MAMMOGRAPHY  Result Value Ref Range   HM Mammogram 0-4 Bi-Rad 0-4 Bi-Rad, Self Reported Normal      Assessment & Plan:   Problem List Items Addressed This Visit      Cardiovascular and Mediastinum   Essential hypertension    The current medical regimen is effective;  continue present plan and medications.         Digestive   GERD (gastroesophageal reflux disease)  The current medical regimen is effective;  continue present plan and medications.       Relevant Medications   omeprazole (PRILOSEC) 20 MG capsule     Musculoskeletal and Integument   Fibromyalgia    The current medical regimen is effective;  continue present plan and medications.         Other   Depression    The current medical regimen is effective;  continue present plan and medications.       Relevant Medications   gabapentin (NEURONTIN) 300 MG capsule   FLUoxetine (PROZAC) 20 MG tablet    Other Visit Diagnoses    Annual physical exam     -  Primary   Relevant Orders   Hepatitis C Antibody   Urinalysis, Routine w reflex microscopic (not at [facility]   CBC with Differential/Platelet   Comprehensive metabolic panel   Lipid Panel w/o Chol/HDL Ratio   PSA   TSH   Encounter for screening for osteoporosis       Relevant Orders   DG Bone Density       Follow up plan: Return in about 6 months (around 01/04/2017), or if symptoms worsen or fail to improve, for med check.

## 2016-07-05 ENCOUNTER — Encounter: Payer: Self-pay | Admitting: Family Medicine

## 2016-07-05 ENCOUNTER — Telehealth: Payer: Self-pay | Admitting: Family Medicine

## 2016-07-05 DIAGNOSIS — E7800 Pure hypercholesterolemia, unspecified: Secondary | ICD-10-CM

## 2016-07-05 DIAGNOSIS — R7989 Other specified abnormal findings of blood chemistry: Secondary | ICD-10-CM

## 2016-07-05 LAB — CBC WITH DIFFERENTIAL/PLATELET
BASOS: 1 %
Basophils Absolute: 0 10*3/uL (ref 0.0–0.2)
EOS (ABSOLUTE): 0.1 10*3/uL (ref 0.0–0.4)
EOS: 3 %
HEMATOCRIT: 42.1 % (ref 34.0–46.6)
HEMOGLOBIN: 13.5 g/dL (ref 11.1–15.9)
IMMATURE GRANULOCYTES: 0 %
Immature Grans (Abs): 0 10*3/uL (ref 0.0–0.1)
Lymphocytes Absolute: 1.9 10*3/uL (ref 0.7–3.1)
Lymphs: 36 %
MCH: 26 pg — ABNORMAL LOW (ref 26.6–33.0)
MCHC: 32.1 g/dL (ref 31.5–35.7)
MCV: 81 fL (ref 79–97)
MONOCYTES: 7 %
Monocytes Absolute: 0.4 10*3/uL (ref 0.1–0.9)
NEUTROS PCT: 53 %
Neutrophils Absolute: 2.8 10*3/uL (ref 1.4–7.0)
Platelets: 278 10*3/uL (ref 150–379)
RBC: 5.19 x10E6/uL (ref 3.77–5.28)
RDW: 16.3 % — ABNORMAL HIGH (ref 12.3–15.4)
WBC: 5.2 10*3/uL (ref 3.4–10.8)

## 2016-07-05 LAB — COMPREHENSIVE METABOLIC PANEL
ALBUMIN: 4.1 g/dL (ref 3.6–4.8)
ALT: 15 IU/L (ref 0–32)
AST: 19 IU/L (ref 0–40)
Albumin/Globulin Ratio: 1.5 (ref 1.2–2.2)
Alkaline Phosphatase: 64 IU/L (ref 39–117)
BUN / CREAT RATIO: 17 (ref 12–28)
BUN: 17 mg/dL (ref 8–27)
Bilirubin Total: 0.3 mg/dL (ref 0.0–1.2)
CALCIUM: 9.6 mg/dL (ref 8.7–10.3)
CO2: 23 mmol/L (ref 18–29)
CREATININE: 0.98 mg/dL (ref 0.57–1.00)
Chloride: 100 mmol/L (ref 96–106)
GFR, EST AFRICAN AMERICAN: 69 mL/min/{1.73_m2} (ref >59)
GFR, EST NON AFRICAN AMERICAN: 60 mL/min/{1.73_m2} (ref >59)
GLOBULIN, TOTAL: 2.8 g/dL (ref 1.5–4.5)
Glucose: 92 mg/dL (ref 65–99)
Potassium: 4.5 mmol/L (ref 3.5–5.2)
SODIUM: 139 mmol/L (ref 134–144)
TOTAL PROTEIN: 6.9 g/dL (ref 6.0–8.5)

## 2016-07-05 LAB — LIPID PANEL W/O CHOL/HDL RATIO
Cholesterol, Total: 230 mg/dL — ABNORMAL HIGH (ref 100–199)
HDL: 44 mg/dL (ref >39)
LDL CALC: 140 mg/dL — AB (ref 0–99)
TRIGLYCERIDES: 231 mg/dL — AB (ref 0–149)
VLDL Cholesterol Cal: 46 mg/dL — ABNORMAL HIGH (ref 5–40)

## 2016-07-05 LAB — PSA: Prostate Specific Ag, Serum: <0.1 ng/mL

## 2016-07-05 LAB — TSH: TSH: 6.43 u[IU]/mL — AB (ref 0.450–4.500)

## 2016-07-05 LAB — HEPATITIS C ANTIBODY: Hep C Virus Ab: <0.1 s/co ratio (ref 0.0–0.9)

## 2016-07-05 NOTE — Telephone Encounter (Signed)
Phone call Discussed with patient elevated TSH and cholesterol patient will do better with diet exercise nutrition recheck TSH lipid panel 2 months or so.

## 2016-08-21 ENCOUNTER — Other Ambulatory Visit: Payer: PPO

## 2016-08-21 DIAGNOSIS — R946 Abnormal results of thyroid function studies: Secondary | ICD-10-CM | POA: Diagnosis not present

## 2016-08-21 DIAGNOSIS — E7800 Pure hypercholesterolemia, unspecified: Secondary | ICD-10-CM

## 2016-08-21 DIAGNOSIS — R7989 Other specified abnormal findings of blood chemistry: Secondary | ICD-10-CM

## 2016-08-22 LAB — LIPID PANEL
Chol/HDL Ratio: 5.6 ratio units — ABNORMAL HIGH (ref 0.0–4.4)
Cholesterol, Total: 265 mg/dL — ABNORMAL HIGH (ref 100–199)
HDL: 47 mg/dL (ref >39)
LDL CALC: 190 mg/dL — AB (ref 0–99)
Triglycerides: 140 mg/dL (ref 0–149)
VLDL Cholesterol Cal: 28 mg/dL (ref 5–40)

## 2016-08-22 LAB — TSH: TSH: 3.65 u[IU]/mL (ref 0.450–4.500)

## 2016-08-23 ENCOUNTER — Telehealth: Payer: Self-pay | Admitting: Family Medicine

## 2016-08-23 ENCOUNTER — Encounter: Payer: Self-pay | Admitting: Family Medicine

## 2016-08-23 NOTE — Telephone Encounter (Signed)
Patient states that she is calling to obtain lab results.  Patient requests that someone call her between 12-1pm today, if possible at [phone].

## 2016-08-23 NOTE — Progress Notes (Signed)
Phone call Discussed with patient thyroid back to normal but cholesterol markedly elevated patient's been eating a very poor diet discussed better diet exercise nutrition

## 2016-09-11 ENCOUNTER — Ambulatory Visit: Payer: PPO

## 2016-09-13 ENCOUNTER — Ambulatory Visit
Admission: RE | Admit: 2016-09-13 | Discharge: 2016-09-13 | Disposition: A | Discharge status: Home / Self Care | Payer: PPO | Source: Ambulatory Visit | Attending: Family Medicine | Admitting: Family Medicine

## 2016-09-13 DIAGNOSIS — Z13820 Encounter for screening for osteoporosis: Secondary | ICD-10-CM | POA: Insufficient documentation

## 2016-09-13 DIAGNOSIS — M810 Age-related osteoporosis without current pathological fracture: Secondary | ICD-10-CM | POA: Insufficient documentation

## 2016-09-25 ENCOUNTER — Telehealth: Payer: Self-pay | Admitting: Family Medicine

## 2016-09-25 NOTE — Telephone Encounter (Signed)
Phone call Discussed with patient osteoporosis patient's had treatment previously which is been completed patient now taking calcium and vitamins continue.

## 2016-12-29 DIAGNOSIS — H04123 Dry eye syndrome of bilateral lacrimal glands: Secondary | ICD-10-CM | POA: Diagnosis not present

## 2017-01-04 ENCOUNTER — Ambulatory Visit (INDEPENDENT_AMBULATORY_CARE_PROVIDER_SITE_OTHER): Payer: PPO | Admitting: Family Medicine

## 2017-01-04 ENCOUNTER — Encounter: Payer: Self-pay | Admitting: Family Medicine

## 2017-01-04 DIAGNOSIS — I10 Essential (primary) hypertension: Secondary | ICD-10-CM

## 2017-01-04 DIAGNOSIS — M797 Fibromyalgia: Secondary | ICD-10-CM

## 2017-01-04 DIAGNOSIS — E7800 Pure hypercholesterolemia, unspecified: Secondary | ICD-10-CM | POA: Diagnosis not present

## 2017-01-04 DIAGNOSIS — F3342 Major depressive disorder, recurrent, in full remission: Secondary | ICD-10-CM

## 2017-01-04 NOTE — Assessment & Plan Note (Signed)
The current medical regimen is effective;  continue present plan and medications.  

## 2017-01-04 NOTE — Progress Notes (Signed)
BP 119/80   Pulse 73   Temp 97.6 F (36.4 C) (Oral)   Ht 5\' 4"  (1.626 m)   Wt 180 lb (81.6 kg)   SpO2 98%   BMI 30.90 kg/m    Subjective:    Patient ID: Angela Martinez, female    DOB: June 01, 1951, 66 y.o.   MRN: [id number]  HPI: Angela Martinez is a 66 y.o. female  Chief Complaint  Patient presents with  . Medication check  . Obesity  Patient follow-up doing well pain is controlled with gabapentin without problems. Generally takes one a day sometimes 2 and as many as 3 and a day with no side effects and good effect for pain control. Reflux stable with Prilosec. Depression stable with fluoxetine.  Relevant past medical, surgical, family and social history reviewed and updated as indicated. Interim medical history since our last visit reviewed. Allergies and medications reviewed and updated.  Review of Systems  Constitutional: Negative.   Respiratory: Negative.   Cardiovascular: Negative.     Per HPI unless specifically indicated above     Objective:    BP 119/80   Pulse 73   Temp 97.6 F (36.4 C) (Oral)   Ht 5\' 4"  (1.626 m)   Wt 180 lb (81.6 kg)   SpO2 98%   BMI 30.90 kg/m   Wt Readings from Last 3 Encounters:  01/04/17 180 lb (81.6 kg)  07/04/16 172 lb (78 kg)  05/18/16 168 lb (76.2 kg)    Physical Exam  Constitutional: She is oriented to person, place, and time. She appears well-developed and well-nourished. No distress.  HENT:  Head: Normocephalic and atraumatic.  Right Ear: Hearing normal.  Left Ear: Hearing normal.  Nose: Nose normal.  Eyes: Conjunctivae and lids are normal. Right eye exhibits no discharge. Left eye exhibits no discharge. No scleral icterus.  Cardiovascular: Normal rate, regular rhythm and normal heart sounds.   Pulmonary/Chest: Effort normal and breath sounds normal. No respiratory distress.  Musculoskeletal: Normal range of motion.  Neurological: She is alert and oriented to person, place, and time.  Skin: Skin is intact. No rash noted.   Psychiatric: She has a normal mood and affect. Her speech is normal and behavior is normal. Judgment and thought content normal. Cognition and memory are normal.    Results for orders placed or performed in visit on 08/21/16  TSH  Result Value Ref Range   TSH 3.650 0.450 - 4.500 uIU/mL  Lipid panel  Result Value Ref Range   Cholesterol, Total 265 (H) 100 - 199 mg/dL   Triglycerides 140 0 - 149 mg/dL   HDL 47 >39 mg/dL   VLDL Cholesterol Cal 28 5 - 40 mg/dL   LDL Calculated 190 (H) 0 - 99 mg/dL   Comment: Comment    Chol/HDL Ratio 5.6 (H) 0.0 - 4.4 ratio units      Assessment & Plan:   Problem List Items Addressed This Visit      Cardiovascular and Mediastinum   Essential hypertension    The current medical regimen is effective;  continue present plan and medications.       Relevant Orders   Basic metabolic panel     Other   Fibromyalgia    The current medical regimen is effective;  continue present plan and medications.       Relevant Orders   Basic metabolic panel   Hyperlipidemia    The current medical regimen is effective;  continue present plan and medications.  Relevant Orders   ALT   AST   Lipid panel   Depression    The current medical regimen is effective;  continue present plan and medications.           Follow up plan: Return in about 6 months (around 07/04/2017) for Physical Exam.

## 2017-01-05 LAB — LIPID PANEL
CHOL/HDL RATIO: 5.1 ratio — AB (ref 0.0–4.4)
Cholesterol, Total: 238 mg/dL — ABNORMAL HIGH (ref 100–199)
HDL: 47 mg/dL (ref >39)
LDL CALC: 155 mg/dL — AB (ref 0–99)
TRIGLYCERIDES: 181 mg/dL — AB (ref 0–149)
VLDL Cholesterol Cal: 36 mg/dL (ref 5–40)

## 2017-01-05 LAB — BASIC METABOLIC PANEL
BUN/Creatinine Ratio: 16 (ref 12–28)
BUN: 17 mg/dL (ref 8–27)
CALCIUM: 9.5 mg/dL (ref 8.7–10.3)
CHLORIDE: 101 mmol/L (ref 96–106)
CO2: 23 mmol/L (ref 18–29)
Creatinine, Ser: 1.06 mg/dL — ABNORMAL HIGH (ref 0.57–1.00)
GFR calc Af Amer: 62 mL/min/{1.73_m2} (ref >59)
GFR calc non Af Amer: 54 mL/min/{1.73_m2} — ABNORMAL LOW (ref >59)
GLUCOSE: 84 mg/dL (ref 65–99)
Potassium: 4.4 mmol/L (ref 3.5–5.2)
Sodium: 141 mmol/L (ref 134–144)

## 2017-01-05 LAB — ALT: ALT: 10 IU/L (ref 0–32)

## 2017-01-05 LAB — AST: AST: 14 IU/L (ref 0–40)

## 2017-01-09 ENCOUNTER — Telehealth: Payer: Self-pay | Admitting: Family Medicine

## 2017-01-09 NOTE — Telephone Encounter (Signed)
Phone call Discussed with patient slight decline in renal function patient not taking any nephrotoxic agents will observe recheck BMP at next regular visit.

## 2017-01-12 ENCOUNTER — Ambulatory Visit (INDEPENDENT_AMBULATORY_CARE_PROVIDER_SITE_OTHER): Payer: PPO | Admitting: Family Medicine

## 2017-01-12 ENCOUNTER — Encounter: Payer: Self-pay | Admitting: Family Medicine

## 2017-01-12 VITALS — BP 130/85 | HR 63 | Temp 97.8°F | Wt 180.0 lb

## 2017-01-12 DIAGNOSIS — A09 Infectious gastroenteritis and colitis, unspecified: Secondary | ICD-10-CM

## 2017-01-12 DIAGNOSIS — R197 Diarrhea, unspecified: Secondary | ICD-10-CM

## 2017-01-12 MED ORDER — CIPROFLOXACIN HCL 500 MG PO TABS: 500.0000 mg | ORAL_TABLET | Freq: Two times a day (BID) | ORAL | 0 refills | Status: DC

## 2017-01-12 NOTE — Progress Notes (Signed)
   BP 130/85   Pulse 63   Temp 97.8 F (36.6 C)   Wt 180 lb (81.6 kg)   SpO2 96%   BMI 30.90 kg/m    Subjective:    Patient ID: Angela Martinez, female    DOB: [birth year], 66 y.o.   MRN: [id number]  HPI: Angela Martinez is a 66 y.o. female  Chief Complaint  Patient presents with  . Nausea    Having alot of nausea and gas, and diarrhea. Had it in the past and was diagnosed with E coli. Feels the same was as she was then.   Nausea, diarrhea with urgency about 3 times daily, bloating, and gas x 1 week. Similar episode about a year ago and was told it was E. Coli. Did eat a salad from [facility] recently which seems to bother her stomach some. Also notes a bitter aftertaste in her mouth. Denies fever, chills, aches, sick contacts, blood or mucus in stools, heartburn.   Relevant past medical, surgical, family and social history reviewed and updated as indicated. Interim medical history since our last visit reviewed. Allergies and medications reviewed and updated.  Review of Systems  Constitutional: Negative.   HENT: Negative.   Eyes: Negative.   Respiratory: Negative.   Gastrointestinal: Positive for abdominal pain and diarrhea.  Genitourinary: Negative.   Musculoskeletal: Negative.   Neurological: Negative.   Psychiatric/Behavioral: Negative.     Per HPI unless specifically indicated above     Objective:    BP 130/85   Pulse 63   Temp 97.8 F (36.6 C)   Wt 180 lb (81.6 kg)   SpO2 96%   BMI 30.90 kg/m   Wt Readings from Last 3 Encounters:  01/12/17 180 lb (81.6 kg)  01/04/17 180 lb (81.6 kg)  07/04/16 172 lb (78 kg)    Physical Exam  Constitutional: She is oriented to person, place, and time. She appears well-developed and well-nourished. No distress.  HENT:  Head: Atraumatic.  Eyes: Conjunctivae are normal. Pupils are equal, round, and reactive to light.  Neck: Normal range of motion. Neck supple.  Cardiovascular: Normal rate and normal heart sounds.   Pulmonary/Chest:  Effort normal and breath sounds normal. No respiratory distress.  Abdominal: There is tenderness (mild ttp diffusely).  Musculoskeletal: Normal range of motion.  Neurological: She is alert and oriented to person, place, and time.  Skin: Skin is warm and dry.  Psychiatric: She has a normal mood and affect. Her behavior is normal.  Nursing note and vitals reviewed.     Assessment & Plan:   Problem List Items Addressed This Visit    None    Visit Diagnoses    Diarrhea of presumed infectious origin    -  Primary   Will treat empirically with cipro. Push fluids, start probiotics, BRAT diet. Return precautions given       Follow up plan: Return for as scheduled.

## 2017-01-12 NOTE — Patient Instructions (Signed)
Follow up as needed

## 2017-03-26 ENCOUNTER — Ambulatory Visit (INDEPENDENT_AMBULATORY_CARE_PROVIDER_SITE_OTHER): Payer: PPO | Admitting: Family Medicine

## 2017-03-26 ENCOUNTER — Encounter: Payer: Self-pay | Admitting: Family Medicine

## 2017-03-26 VITALS — BP 115/76 | HR 84 | Temp 98.2°F | Wt 178.0 lb

## 2017-03-26 DIAGNOSIS — N390 Urinary tract infection, site not specified: Secondary | ICD-10-CM | POA: Diagnosis not present

## 2017-03-26 DIAGNOSIS — M25512 Pain in left shoulder: Secondary | ICD-10-CM

## 2017-03-26 DIAGNOSIS — R399 Unspecified symptoms and signs involving the genitourinary system: Secondary | ICD-10-CM | POA: Diagnosis not present

## 2017-03-26 MED ORDER — CYCLOBENZAPRINE HCL 10 MG PO TABS: 10.0000 mg | ORAL_TABLET | Freq: Three times a day (TID) | ORAL | 0 refills | Status: DC | PRN

## 2017-03-26 MED ORDER — CIPROFLOXACIN HCL 250 MG PO TABS: 250.0000 mg | ORAL_TABLET | Freq: Two times a day (BID) | ORAL | 0 refills | Status: DC

## 2017-03-26 NOTE — Progress Notes (Signed)
BP 115/76   Pulse 84   Temp 98.2 F (36.8 C)   Wt 178 lb (80.7 kg)   SpO2 97%   BMI 30.55 kg/m    Subjective:    Patient ID: Angela Martinez, female    DOB: May 30, 1951, 66 y.o.   MRN: [id number]  HPI: Angela Martinez is a 66 y.o. female  Chief Complaint  Patient presents with  . Urinary Tract Infection    x 4 days, burning, tingling, frequency, small amounts. been taking AZO.    Patient presents with 4 day history of dysuria, frequency, and inability to fully void. Took 2 days of AZO with mild relief. Denies fever, chills, N/V/D, hematuria. Has had infrequent UTIs in the past.   Left shoulder pain x several weeks off and on. Typically comes on when extending her arm behind her or doing a physical task such as push mowing. No known injury. Hx of fibromyalgia. Takes gabapentin daily, usually just one but now has been taking two daily. Heating pad and massage seems to help a good bit.   Past Medical History:  Diagnosis Date  . Anxiety   . Complication of anesthesia    WITH WRIST SURGERY HARD TO GET RELAXED. OK WITH KNEE / BACK  . Cough   . Depression   . Fibromyalgia   . GERD (gastroesophageal reflux disease)   . Herpes simplex    1 AND 2  . Hyperlipidemia   . OA (osteoarthritis)   . Osteopenia   . Overweight   . Plantar fasciitis   . Shortness of breath dyspnea    DOE  . Tremors of nervous system    Social History   Social History  . Marital status: Married    Spouse name: N/A  . Number of children: N/A  . Years of education: N/A   Occupational History  . Not on file.   Social History Main Topics  . Smoking status: Never Smoker  . Smokeless tobacco: Never Used  . Alcohol use No  . Drug use: No  . Sexual activity: Not on file   Other Topics Concern  . Not on file   Social History Narrative  . No narrative on file    Relevant past medical, surgical, family and social history reviewed and updated as indicated. Interim medical history since our last visit  reviewed. Allergies and medications reviewed and updated.  Review of Systems  Constitutional: Negative.   HENT: Negative.   Eyes: Negative.   Respiratory: Negative.   Cardiovascular: Negative.   Genitourinary: Positive for dysuria and frequency.  Musculoskeletal: Positive for arthralgias and myalgias.  Neurological: Negative.   Psychiatric/Behavioral: Negative.     Per HPI unless specifically indicated above     Objective:    BP 115/76   Pulse 84   Temp 98.2 F (36.8 C)   Wt 178 lb (80.7 kg)   SpO2 97%   BMI 30.55 kg/m   Wt Readings from Last 3 Encounters:  03/26/17 178 lb (80.7 kg)  01/12/17 180 lb (81.6 kg)  01/04/17 180 lb (81.6 kg)    Physical Exam  Constitutional: She is oriented to person, place, and time. She appears well-developed and well-nourished. No distress.  HENT:  Head: Atraumatic.  Eyes: Conjunctivae are normal. Pupils are equal, round, and reactive to light.  Neck: Normal range of motion. Neck supple.  Pulmonary/Chest: Effort normal and breath sounds normal. No respiratory distress.  Abdominal: Soft. Bowel sounds are normal.  Musculoskeletal:  Slightly  reduced ROM of left shoulder d/t pain. Mild crepitus with passive ROM. Cannot extend overhead. Strength equal and intact b/l UEs  TTP into left deltoid  Neurological: She is alert and oriented to person, place, and time.  Skin: Skin is warm and dry.  Psychiatric: She has a normal mood and affect. Her behavior is normal.  Nursing note and vitals reviewed.     Assessment & Plan:   Problem List Items Addressed This Visit    None    Visit Diagnoses    Acute lower UTI    -  Primary   Will treat with cipro, push fluids, recommended probiotic. Await cx. F/u if worsening or no improvement   Relevant Orders   UA/M w/rflx Culture, Routine (STAT) (Completed)   Acute pain of left shoulder       Likely arthritic. Discussed flexeril, increasing gabapentin to 3-4 tabs daily, gentle stretches, heating pad.  F/u if no improvement over the next few weeks.        Follow up plan: Return for as scheduled.

## 2017-03-26 NOTE — Patient Instructions (Signed)
Can increase gabapentin to 3 or 4 daily

## 2017-03-28 LAB — MICROSCOPIC EXAMINATION: Bacteria, UA: NONE SEEN

## 2017-03-28 LAB — URINE CULTURE, REFLEX

## 2017-03-28 LAB — UA/M W/RFLX CULTURE, ROUTINE
BILIRUBIN UA: NEGATIVE
KETONES UA: NEGATIVE
NITRITE UA: POSITIVE — AB
SPEC GRAV UA: 1.015 (ref 1.005–1.030)
UUROB: 0.2 mg/dL (ref 0.2–1.0)
pH, UA: 5 (ref 5.0–7.5)

## 2017-07-05 ENCOUNTER — Ambulatory Visit (INDEPENDENT_AMBULATORY_CARE_PROVIDER_SITE_OTHER): Payer: PPO

## 2017-07-05 VITALS — BP 116/78 | HR 75 | Temp 97.6°F | Resp 16 | Ht 63.0 in | Wt 174.8 lb

## 2017-07-05 DIAGNOSIS — Z0000 Encounter for general adult medical examination without abnormal findings: Secondary | ICD-10-CM | POA: Diagnosis not present

## 2017-07-05 NOTE — Patient Instructions (Addendum)
Angela Martinez , Thank you for taking time to come for your Medicare Wellness Visit. I appreciate your ongoing commitment to your health goals. Please review the following plan we discussed and let me know if I can assist you in the future.   Screening recommendations/referrals: Colonoscopy: Completed 05/03/2011 Mammogram: Completed 4/7/201i7 Bone Density: Completed 09/13/2016 Recommended yearly ophthalmology/optometry visit for glaucoma screening and checkup Recommended yearly dental visit for hygiene and checkup  Vaccinations: Influenza vaccine: up to date Pneumococcal vaccine: due, look for immunization records Tdap vaccine: up to date Shingles vaccine: due, check with your insurance company for coverage  Advanced directives: Advance directive discussed with you today. I have provided a copy for you to complete at home and have notarized. Once this is complete please bring a copy in to our office so we can scan it into your chart.  Conditions/risks identified: none   Next appointment: Follow up on 07/12/2017 at 8:00am with Dr.Crissman. Follow up in on year for your annual wellness exam   Preventive Care 68 Years and Older, Female Preventive care refers to lifestyle choices and visits with your health care provider that can promote health and wellness. What does preventive care include?  A yearly physical exam. This is also called an annual well check.  Dental exams once or twice a year.  Routine eye exams. Ask your health care provider how often you should have your eyes checked.  Personal lifestyle choices, including:  Daily care of your teeth and gums.  Regular physical activity.  Eating a healthy diet.  Avoiding tobacco and drug use.  Limiting alcohol use.  Practicing safe sex.  Taking low-dose aspirin every day.  Taking vitamin and mineral supplements as recommended by your health care provider. What happens during an annual well check? The services and screenings  done by your health care provider during your annual well check will depend on your age, overall health, lifestyle risk factors, and family history of disease. Counseling  Your health care provider may ask you questions about your:  Alcohol use.  Tobacco use.  Drug use.  Emotional well-being.  Home and relationship well-being.  Sexual activity.  Eating habits.  History of falls.  Memory and ability to understand (cognition).  Work and work Statistician.  Reproductive health. Screening  You may have the following tests or measurements:  Height, weight, and BMI.  Blood pressure.  Lipid and cholesterol levels. These may be checked every 5 years, or more frequently if you are over 3 years old.  Skin check.  Lung cancer screening. You may have this screening every year starting at age 27 if you have a 30-pack-year history of smoking and currently smoke or have quit within the past 15 years.  Fecal occult blood test (FOBT) of the stool. You may have this test every year starting at age 57.  Flexible sigmoidoscopy or colonoscopy. You may have a sigmoidoscopy every 5 years or a colonoscopy every 10 years starting at age 67.  Hepatitis C blood test.  Hepatitis B blood test.  Sexually transmitted disease (STD) testing.  Diabetes screening. This is done by checking your blood sugar (glucose) after you have not eaten for a while (fasting). You may have this done every 1-3 years.  Bone density scan. This is done to screen for osteoporosis. You may have this done starting at age 91.  Mammogram. This may be done every 1-2 years. Talk to your health care provider about how often you should have regular mammograms. Talk with  your health care provider about your test results, treatment options, and if necessary, the need for more tests. Vaccines  Your health care provider may recommend certain vaccines, such as:  Influenza vaccine. This is recommended every year.  Tetanus,  diphtheria, and acellular pertussis (Tdap, Td) vaccine. You may need a Td booster every 10 years.  Zoster vaccine. You may need this after age 53.  Pneumococcal 13-valent conjugate (PCV13) vaccine. One dose is recommended after age 67.  Pneumococcal polysaccharide (PPSV23) vaccine. One dose is recommended after age 32. Talk to your health care provider about which screenings and vaccines you need and how often you need them. This information is not intended to replace advice given to you by your health care provider. Make sure you discuss any questions you have with your health care provider. Document Released: 12/03/2015 Document Revised: 07/26/2016 Document Reviewed: 09/07/2015 Elsevier Interactive Patient Education  2017 [place] Prevention in the Home Falls can cause injuries. They can happen to people of all ages. There are many things you can do to make your home safe and to help prevent falls. What can I do on the outside of my home?  Regularly fix the edges of walkways and driveways and fix any cracks.  Remove anything that might make you trip as you walk through a door, such as a raised step or threshold.  Trim any bushes or trees on the path to your home.  Use bright outdoor lighting.  Clear any walking paths of anything that might make someone trip, such as rocks or tools.  Regularly check to see if handrails are loose or broken. Make sure that both sides of any steps have handrails.  Any raised decks and porches should have guardrails on the edges.  Have any leaves, snow, or ice cleared regularly.  Use sand or salt on walking paths during winter.  Clean up any spills in your garage right away. This includes oil or grease spills. What can I do in the bathroom?  Use night lights.  Install grab bars by the toilet and in the tub and shower. Do not use towel bars as grab bars.  Use non-skid mats or decals in the tub or shower.  If you need to sit down in  the shower, use a plastic, non-slip stool.  Keep the floor dry. Clean up any water that spills on the floor as soon as it happens.  Remove soap buildup in the tub or shower regularly.  Attach bath mats securely with double-sided non-slip rug tape.  Do not have throw rugs and oth due, check with your insurance company for coverage  Advanced directives: Advance directive discussed with you today. I have provided a copy for you to complete at home and have notarized. Once this is complete please bring a copy in to our office so we can scan it into your chart.  Conditions/risks identified: none   Next appointment: Follow up on 07/12/2017 at 8:00am with Dr.Crissman. Follow up in on year for your annual wellness exam   Preventive Care 68 Years and Older, Female Preventive care refers to lifestyle choices and visits with your health care provider that can promote health and wellness. What does preventive care include?  A yearly physical exam. This is also called an annual well check.  Dental exams once or twice a year.  Routine eye exams. Ask your health care provider how often you should have your eyes checked.  Personal lifestyle choices, including:  Daily care of your teeth and gums.  Regular physical activity.  Eating a healthy diet.  Avoiding tobacco and drug use.  Limiting alcohol use.  Practicing safe sex.  Taking low-dose aspirin every day.  Taking vitamin and mineral supplements as recommended by your health care provider. What happens during an annual well check? The services and screenings  done by your health care provider during your annual well check will depend on your age, overall health, lifestyle risk factors, and family history of disease. Counseling  Your health care provider may ask you questions about your:  Alcohol use.  Tobacco use.  Drug use.  Emotional well-being.  Home and relationship well-being.  Sexual activity.  Eating habits.  History of falls.  Memory and ability to understand (cognition).  Work and work Statistician.  Reproductive health. Screening  You may have the following tests or measurements:  Height, weight, and BMI.  Blood pressure.  Lipid and cholesterol levels. These may be checked every 5 years, or more frequently if you are over 3 years old.  Skin check.  Lung cancer screening. You may have this screening every year starting at age 27 if you have a 30-pack-year history of smoking and currently smoke or have quit within the past 15 years.  Fecal occult blood test (FOBT) of the stool. You may have this test every year starting at age 57.  Flexible sigmoidoscopy or colonoscopy. You may have a sigmoidoscopy every 5 years or a colonoscopy every 10 years starting at age 67.  Hepatitis C blood test.  Hepatitis B blood test.  Sexually transmitted disease (STD) testing.  Diabetes screening. This is done by checking your blood sugar (glucose) after you have not eaten for a while (fasting). You may have this done every 1-3 years.  Bone density scan. This is done to screen for osteoporosis. You may have this done starting at age 91.  Mammogram. This may be done every 1-2 years. Talk to your health care provider about how often you should have regular mammograms. Talk with  your health care provider about your test results, treatment options, and if necessary, the need for more tests. Vaccines  Your health care provider may recommend certain vaccines, such as:  Influenza vaccine. This is recommended every year.  Tetanus,  diphtheria, and acellular pertussis (Tdap, Td) vaccine. You may need a Td booster every 10 years.  Zoster vaccine. You may need this after age 53.  Pneumococcal 13-valent conjugate (PCV13) vaccine. One dose is recommended after age 67.  Pneumococcal polysaccharide (PPSV23) vaccine. One dose is recommended after age 32. Talk to your health care provider about which screenings and vaccines you need and how often you need them. This information is not intended to replace advice given to you by your health care provider. Make sure you discuss any questions you have with your health care provider. Document Released: 12/03/2015 Document Revised: 07/26/2016 Document Reviewed: 09/07/2015 Elsevier Interactive Patient Education  2017 [place] Prevention in the Home Falls can cause injuries. They can happen to people of all ages. There are many things you can do to make your home safe and to help prevent falls. What can I do on the outside of my home?  Regularly fix the edges of walkways and driveways and fix any cracks.  Remove anything that might make you trip as you walk through a door, such as a raised step or threshold.  Trim any bushes or trees on the path to your home.  Use bright outdoor lighting.  Clear any walking paths of anything that might make someone trip, such as rocks or tools.  Regularly check to see if handrails are loose or broken. Make sure that both sides of any steps have handrails.  Any raised decks and porches should have guardrails on the edges.  Have any leaves, snow, or ice cleared regularly.  Use sand or salt on walking paths during winter.  Clean up any spills in your garage right away. This includes oil or grease spills. What can I do in the bathroom?  Use night lights.  Install grab bars by the toilet and in the tub and shower. Do not use towel bars as grab bars.  Use non-skid mats or decals in the tub or shower.  If you need to sit down in  the shower, use a plastic, non-slip stool.  Keep the floor dry. Clean up any water that spills on the floor as soon as it happens.  Remove soap buildup in the tub or shower regularly.  Attach bath mats securely with double-sided non-slip rug tape.  Do not have throw rugs and other things on the floor that can make you trip. What can I do in the bedroom?  Use night lights.  Make sure that you have a light by your bed that is easy to reach.  Do not use any sheets or blankets that are too big for your bed. They should not hang down onto the floor.  Have a firm chair that has side arms. You can use this for support while you get dressed.  Do not have throw rugs and other things on the floor that can make you trip. What can I do in the kitchen?  Clean up any spills right away.  Avoid walking on wet floors.  Keep items that you use a lot in easy-to-reach places.  If you need to reach something above you, use a strong step stool that has a grab bar.  Keep electrical cords out of the way.  Do not  use floor polish or wax that makes floors slippery. If you must use wax, use non-skid floor wax.  Do not have throw rugs and other things on the floor that can make you trip. What can I do with my stairs?  Do not leave any items on the stairs.  Make sure that there are handrails on both sides of the stairs and use them. Fix handrails that are broken or loose. Make sure that handrails are as long as the stairways.  Check any carpeting to make sure that it is firmly attached to the stairs. Fix any carpet that is loose or worn.  Avoid having throw rugs at the top or bottom of the stairs. If you do have throw rugs, attach them to the floor with carpet tape.  Make sure that you have a light switch at the top of the stairs and the bottom of the stairs. If you do not have them, ask someone to add them for you. What else can I do to help prevent falls?  Wear shoes that:  Do not have high  heels.  Have rubber bottoms.  Are comfortable and fit you well.  Are closed at the toe. Do not wear sandals.  If you use a stepladder:  Make sure that it is fully opened. Do not climb a closed stepladder.  Make sure that both sides of the stepladder are locked into place.  Ask someone to hold it for you, if possible.  Clearly mark and make sure that you can see:  Any grab bars or handrails.  First and last steps.  Where the edge of each step is.  Use tools that help you move around (mobility aids) if they are needed. These include:  Canes.  Walkers.  Scooters.  Crutches.  Turn on the lights when you go into a dark area. Replace any light bulbs as soon as they burn out.  Set up your furniture so you have a clear path. Avoid moving your furniture around.  If any of your floors are uneven, fix them.  If there are any pets around you, be aware of where they are.  Review your medicines with your doctor. Some medicines can make you feel dizzy. This can increase your chance of falling. Ask your doctor what other things that you can do to help prevent falls. This information is not intended to replace advice given to you by your health care provider. Make sure you discuss any questions you have with your health care provider. Document Released: 09/02/2009 Document Revised: 04/13/2016 Document Reviewed: 12/11/2014 Elsevier Interactive Patient Education  2017 [employer].

## 2017-07-05 NOTE — Progress Notes (Signed)
Subjective:   Angela Martinez is a 66 y.o. female who presents for Medicare Annual (Subsequent) preventive examination.  Review of Systems:  Cardiac Risk Factors include: advanced age (>47men, >81 women);hypertension;dyslipidemia;obesity (BMI >30kg/m2)     Objective:     Vitals: BP 116/78 (BP Location: Left Arm, Patient Position: Sitting)   Pulse 75   Temp 97.6 F (36.4 C)   Resp 16   Ht 5\' 3"  (1.6 m)   Wt 174 lb 12.8 oz (79.3 kg)   BMI 30.96 kg/m   Body mass index is 30.96 kg/m.   Tobacco History  Smoking Status  . Never Smoker  Smokeless Tobacco  . Never Used     Counseling given: Not Answered   Past Medical History:  Diagnosis Date  . Anxiety   . Complication of anesthesia    WITH WRIST SURGERY HARD TO GET RELAXED. OK WITH KNEE / BACK  . Cough   . Depression   . Fibromyalgia   . GERD (gastroesophageal reflux disease)   . Herpes simplex    1 AND 2  . Hyperlipidemia   . OA (osteoarthritis)   . Osteopenia   . Overweight   . Plantar fasciitis   . Shortness of breath dyspnea    DOE  . Tremors of nervous system    Past Surgical History:  Procedure Laterality Date  . BACK SURGERY    . BUNIONECTOMY    . CATARACT EXTRACTION W/PHACO Right 03/16/2016   Procedure: CATARACT EXTRACTION PHACO AND INTRAOCULAR LENS PLACEMENT (I[facility]);  Surgeon: Leandrew Koyanagi, MD;  Location: [facility] ORS;  Service: Ophthalmology;  Laterality: Right;  [place] 52.4 AP% 8.8 CDE 4.63 Fluid Pack Lot # [id number]  . CATARACT EXTRACTION W/PHACO Left 05/18/2016   Procedure: CATARACT EXTRACTION PHACO AND INTRAOCULAR LENS PLACEMENT ([place]);  Surgeon: Leandrew Koyanagi, MD;  Location: [facility] ORS;  Service: Ophthalmology;  Laterality: Left;  [place] 47.3 AP% 10.4 CDE 4.90 Fluid pack lot # [id number]  . DILATION AND CURETTAGE OF UTERUS     x 3  . JOINT REPLACEMENT    . LUMBAR FUSION     L4-L5 with cage  . TOTAL KNEE ARTHROPLASTY Left 2007  . WRIST SURGERY     Family History  Problem Relation Age of Onset    . Alcohol abuse Father   . Alcohol abuse Sister   . Arthritis Sister        5 sisters, some with RA  . Seizures Sister   . Cancer Sister        breast  . Diabetes Brother    History  Sexual Activity  . Sexual activity: Not on file    Outpatient Encounter Prescriptions as of 07/05/2017  Medication Sig  . BIOTIN PO Take by mouth daily.  . cyclobenzaprine (FLEXERIL) 10 MG tablet Take 1 tablet (10 mg total) by mouth 3 (three) times daily as needed for muscle spasms.  Marland Kitchen docusate sodium (COLACE) 50 MG capsule Take 50 mg by mouth daily.   Marland Kitchen FLUoxetine (PROZAC) 20 MG tablet Take 1 tablet (20 mg total) by mouth daily.  Marland Kitchen gabapentin (NEURONTIN) 300 MG capsule Take 1 capsule (300 mg total) by mouth 2 (two) times daily.  Marland Kitchen loratadine (CLARITIN) 10 MG tablet Take 10 mg by mouth daily.  Marland Kitchen omeprazole (PRILOSEC) 20 MG capsule Take 1 capsule (20 mg total) by mouth daily. Please call and make an appointment ASAP  . Vitamin D, Cholecalciferol, 400 UNITS TABS Take 1,000 Units by mouth.   . valACYclovir (  VALTREX) 500 MG tablet Take 500 mg by mouth as needed.  . [DISCONTINUED] ciprofloxacin (CIPRO) 250 MG tablet Take 1 tablet (250 mg total) by mouth 2 (two) times daily.   No facility-administered encounter medications on file as of 07/05/2017.     Activities of Daily Living In your present state of health, do you have any difficulty performing the following activities: 07/05/2017  Hearing? N  Vision? N  Difficulty concentrating or making decisions? N  Walking or climbing stairs? N  Dressing or bathing? N  Doing errands, shopping? N  Preparing Food and eating ? N  Using the Toilet? N  In the past six months, have you accidently leaked urine? N  Do you have problems with loss of bowel control? N  Managing your Medications? N  Managing your Finances? N  Housekeeping or managing your Housekeeping? N  Some recent data might be hidden    Patient Care Team: Guadalupe Maple, MD as PCP - General  (Family Medicine) Beverly Gust, MD (Unknown Physician Specialty)    Assessment:     Exercise Activities and Dietary recommendations Current Exercise Habits: The patient does not participate in regular exercise at present, Exercise limited by: orthopedic condition(s)  Goals    None     Fall Risk Fall Risk  07/05/2017 01/04/2017 07/04/2016 06/09/2015  Falls in the past year? No No No No   Depression Screen PHQ 2/9 Scores 07/05/2017 07/04/2016 06/09/2015  PHQ - 2 Score 0 2 1     Cognitive Function     6CIT Screen 07/05/2017  What Year? 0 points  What month? 0 points  What time? 0 points  Count back from 20 0 points  Months in reverse 0 points  Repeat phrase 2 points  Total Score 2    Immunization History  Administered Date(s) Administered  . Influenza-Unspecified 08/20/2014, 09/08/2015, 09/21/2016  . Tdap 07/16/2012   Screening Tests Health Maintenance  Topic Date Due  . INFLUENZA VACCINE  06/20/2017  . PNA vac Low Risk Adult (1 of 2 - PCV13) 08/20/2017 (Originally 12/31/2013)  . MAMMOGRAM  02/22/2018  . COLONOSCOPY  04/27/2021  . TETANUS/TDAP  07/16/2022  . DEXA SCAN  Completed  . Hepatitis C Screening  Completed      Plan:     I have personally reviewed and addressed the Medicare Annual Wellness questionnaire and have noted the following in the patient's chart:  A. Medical and social history B. Use of alcohol, tobacco or illicit drugs  C. Current medications and supplements D. Functional ability and status E.  Nutritional status F.  Physical activity G. Advance directives H. List of other physicians I.  Hospitalizations, surgeries, and ER visits in previous 12 months J.  [place] such as hearing and vision if needed, cognitive and depression L. Referrals and appointments  In addition, I have reviewed and discussed with patient certain preventive protocols, quality metrics, and best practice recommendations. A written personalized care plan for  preventive services as well as general preventive health recommendations were provided to patient.   Signed,  Tyler Aas, LPN Nurse Health Advisor   MD Recommendations: Patient states she "thinks she has worms, she has had them in the past about 2 years ago."

## 2017-07-12 ENCOUNTER — Encounter: Payer: PPO | Admitting: Family Medicine

## 2017-08-02 ENCOUNTER — Ambulatory Visit (INDEPENDENT_AMBULATORY_CARE_PROVIDER_SITE_OTHER): Payer: PPO | Admitting: Family Medicine

## 2017-08-02 ENCOUNTER — Encounter: Payer: Self-pay | Admitting: Family Medicine

## 2017-08-02 VITALS — BP 139/81 | HR 67 | Ht 64.0 in | Wt 177.0 lb

## 2017-08-02 DIAGNOSIS — I10 Essential (primary) hypertension: Secondary | ICD-10-CM

## 2017-08-02 DIAGNOSIS — Z7189 Other specified counseling: Secondary | ICD-10-CM | POA: Diagnosis not present

## 2017-08-02 DIAGNOSIS — Z1231 Encounter for screening mammogram for malignant neoplasm of breast: Secondary | ICD-10-CM | POA: Diagnosis not present

## 2017-08-02 DIAGNOSIS — B351 Tinea unguium: Secondary | ICD-10-CM | POA: Insufficient documentation

## 2017-08-02 DIAGNOSIS — Z1329 Encounter for screening for other suspected endocrine disorder: Secondary | ICD-10-CM | POA: Diagnosis not present

## 2017-08-02 DIAGNOSIS — Z0000 Encounter for general adult medical examination without abnormal findings: Secondary | ICD-10-CM | POA: Diagnosis not present

## 2017-08-02 DIAGNOSIS — E7800 Pure hypercholesterolemia, unspecified: Secondary | ICD-10-CM | POA: Diagnosis not present

## 2017-08-02 DIAGNOSIS — Z1239 Encounter for other screening for malignant neoplasm of breast: Secondary | ICD-10-CM

## 2017-08-02 DIAGNOSIS — K219 Gastro-esophageal reflux disease without esophagitis: Secondary | ICD-10-CM

## 2017-08-02 DIAGNOSIS — F32A Depression, unspecified: Secondary | ICD-10-CM

## 2017-08-02 DIAGNOSIS — M797 Fibromyalgia: Secondary | ICD-10-CM | POA: Diagnosis not present

## 2017-08-02 DIAGNOSIS — F329 Major depressive disorder, single episode, unspecified: Secondary | ICD-10-CM | POA: Diagnosis not present

## 2017-08-02 DIAGNOSIS — Z131 Encounter for screening for diabetes mellitus: Secondary | ICD-10-CM

## 2017-08-02 LAB — MICROSCOPIC EXAMINATION: BACTERIA UA: NONE SEEN

## 2017-08-02 LAB — URINALYSIS, ROUTINE W REFLEX MICROSCOPIC
Bilirubin, UA: NEGATIVE
GLUCOSE, UA: NEGATIVE
Ketones, UA: NEGATIVE
Leukocytes, UA: NEGATIVE
Nitrite, UA: NEGATIVE
PROTEIN UA: NEGATIVE
RBC, UA: NEGATIVE
SPEC GRAV UA: 1.015 (ref 1.005–1.030)
UUROB: 0.2 mg/dL (ref 0.2–1.0)
pH, UA: 6 (ref 5.0–7.5)

## 2017-08-02 MED ORDER — FLUOXETINE HCL 20 MG PO TABS: 20.0000 mg | ORAL_TABLET | Freq: Every day | ORAL | 4 refills | Status: DC

## 2017-08-02 MED ORDER — TERBINAFINE HCL 250 MG PO TABS: 250.0000 mg | ORAL_TABLET | Freq: Every day | ORAL | 2 refills | Status: DC

## 2017-08-02 MED ORDER — GABAPENTIN 300 MG PO CAPS: 300.0000 mg | ORAL_CAPSULE | Freq: Two times a day (BID) | ORAL | 4 refills | Status: DC

## 2017-08-02 MED ORDER — OMEPRAZOLE 20 MG PO CPDR: 20.0000 mg | DELAYED_RELEASE_CAPSULE | Freq: Every day | ORAL | 4 refills | Status: DC

## 2017-08-02 NOTE — Assessment & Plan Note (Signed)
Discussed care and treatment of toenails patient elects to go ahead and have treatment will start Lamisil after blood work comes back and shows liver function is normal.

## 2017-08-02 NOTE — Progress Notes (Signed)
BP 139/81   Pulse 67   Ht 5\' 4"  (1.626 m)   Wt 177 lb (80.3 kg)   SpO2 97%   BMI 30.38 kg/m    Subjective:    Patient ID: Angela Martinez, female    DOB: January 01, 1951, 66 y.o.   MRN: [id number]  HPI: CHE BELOW is a 66 y.o. female  Chief Complaint  Patient presents with  . Annual Exam  . Nail Problem    Toe nail and fingernail issues  . Recurrent Skin Infections    Possible on face.    Patient concerned with ganglion cyst developed on left distal second toe. No known trauma or irritation doesn't bother her. Patient also concerned about developing fungal infections of second toenails on both feet him somewhat bothersome requesting medication because of their worrisome and starting to split. Patient also taking fluoxetine without problems and good control of nerves Husband also concurs Patient using gabapentin for fibromyalgia doing okay occasionally taking more than one a day. Reflux doing okay sometimes taking Prilosec twice a day.  Relevant past medical, surgical, family and social history reviewed and updated as indicated. Interim medical history since our last visit reviewed. Allergies and medications reviewed and updated.  Review of Systems  Constitutional: Negative.   HENT: Negative.   Eyes: Negative.   Respiratory: Negative.   Cardiovascular: Negative.   Gastrointestinal: Negative.   Endocrine: Negative.   Genitourinary: Negative.   Musculoskeletal: Negative.   Skin: Negative.   Allergic/Immunologic: Negative.   Neurological: Negative.   Hematological: Negative.   Psychiatric/Behavioral: Negative.     Per HPI unless specifically indicated above     Objective:    BP 139/81   Pulse 67   Ht 5\' 4"  (1.626 m)   Wt 177 lb (80.3 kg)   SpO2 97%   BMI 30.38 kg/m   Wt Readings from Last 3 Encounters:  08/02/17 177 lb (80.3 kg)  07/05/17 174 lb 12.8 oz (79.3 kg)  03/26/17 178 lb (80.7 kg)    Physical Exam  Constitutional: She is oriented to person, place,  and time. She appears well-developed and well-nourished.  HENT:  Head: Normocephalic and atraumatic.  Right Ear: External ear normal.  Left Ear: External ear normal.  Nose: Nose normal.  Mouth/Throat: Oropharynx is clear and moist.  Eyes: Pupils are equal, round, and reactive to light. Conjunctivae and EOM are normal.  Neck: Normal range of motion. Neck supple. Carotid bruit is not present.  Cardiovascular: Normal rate, regular rhythm and normal heart sounds.   No murmur heard. Pulmonary/Chest: Effort normal and breath sounds normal. She exhibits no mass. Right breast exhibits no mass, no skin change and no tenderness. Left breast exhibits no mass, no skin change and no tenderness. Breasts are symmetrical.  Abdominal: Soft. Bowel sounds are normal. There is no hepatosplenomegaly.  Musculoskeletal: Normal range of motion.  Neurological: She is alert and oriented to person, place, and time.  Skin: No rash noted.  Changes of onychomycosis in toenails  Psychiatric: She has a normal mood and affect. Her behavior is normal. Judgment and thought content normal.    Results for orders placed or performed in visit on 03/26/17  Microscopic Examination  Result Value Ref Range   WBC, UA >30 (A) 0 - 5 /hpf   RBC, UA 3-5 (A) 0 - 2 /hpf   Epithelial Cells (non renal) 0-10 0 - 10 /hpf   Bacteria, UA None seen None seen/Few  UA/M w/rflx Culture, Routine (STAT)  Result Value Ref Range   Specific Gravity, UA 1.015 1.005 - 1.030   pH, UA 5.0 5.0 - 7.5   Color, UA Orange Yellow   Appearance Ur Cloudy (A) Clear   Leukocytes, UA 2+ (A) Negative   Protein, UA 2+ (A) Negative/Trace   Glucose, UA Trace (A) Negative   Ketones, UA Negative Negative   RBC, UA 2+ (A) Negative   Bilirubin, UA Negative Negative   Urobilinogen, Ur 0.2 0.2 - 1.0 mg/dL   Nitrite, UA Positive (A) Negative   Microscopic Examination See below:    Urinalysis Reflex Comment   Urine Culture, Routine  Result Value Ref Range    Urine Culture, Routine Final report    Organism ID, Bacteria Comment       Assessment & Plan:   Problem List Items Addressed This Visit      Cardiovascular and Mediastinum   Essential hypertension - Primary    No medication and doing well.      Relevant Orders   CBC with Differential/Platelet     Digestive   GERD (gastroesophageal reflux disease)    The current medical regimen is effective;  continue present plan and medications.       Relevant Medications   omeprazole (PRILOSEC) 20 MG capsule     Musculoskeletal and Integument   Onychomycosis    Discussed care and treatment of toenails patient elects to go ahead and have treatment will start Lamisil after blood work comes back and shows liver function is normal.      Relevant Medications   terbinafine (LAMISIL) 250 MG tablet     Other   Fibromyalgia    The current medical regimen is effective;  continue present plan and medications.       Hyperlipidemia   Relevant Orders   Lipid panel   Depression    The current medical regimen is effective;  continue present plan and medications.       Relevant Medications   FLUoxetine (PROZAC) 20 MG tablet   gabapentin (NEURONTIN) 300 MG capsule   Advanced care planning/counseling discussion    A voluntary discussion about advance care planning including the explanation and discussion of advance directives was extensively discussed  with the patient.  Explanation about the health care proxy and Living will was reviewed and packet with forms with explanation of how to fill them out was given.          Other Visit Diagnoses    Screening for diabetes mellitus (DM)       Relevant Orders   Comprehensive metabolic panel   Urinalysis, Routine w reflex microscopic   Thyroid disorder screen       Relevant Orders   TSH   Annual physical exam       Breast cancer screening       Relevant Orders   MM DIGITAL SCREENING BILATERAL       Follow up plan: Return in about 6  months (around 01/30/2018).

## 2017-08-02 NOTE — Assessment & Plan Note (Signed)
The current medical regimen is effective;  continue present plan and medications.  

## 2017-08-02 NOTE — Assessment & Plan Note (Signed)
A voluntary discussion about advance care planning including the explanation and discussion of advance directives was extensively discussed  with the patient.  Explanation about the health care proxy and Living will was reviewed and packet with forms with explanation of how to fill them out was given.    

## 2017-08-02 NOTE — Assessment & Plan Note (Signed)
No medication and doing well.

## 2017-08-03 LAB — CBC WITH DIFFERENTIAL/PLATELET
BASOS ABS: 0 10*3/uL (ref 0.0–0.2)
Basos: 1 %
EOS (ABSOLUTE): 0.2 10*3/uL (ref 0.0–0.4)
Eos: 4 %
HEMATOCRIT: 39.1 % (ref 34.0–46.6)
HEMOGLOBIN: 12.9 g/dL (ref 11.1–15.9)
IMMATURE GRANULOCYTES: 0 %
Immature Grans (Abs): 0 10*3/uL (ref 0.0–0.1)
LYMPHS ABS: 1.9 10*3/uL (ref 0.7–3.1)
Lymphs: 35 %
MCH: 25.2 pg — ABNORMAL LOW (ref 26.6–33.0)
MCHC: 33 g/dL (ref 31.5–35.7)
MCV: 76 fL — AB (ref 79–97)
MONOCYTES: 8 %
Monocytes Absolute: 0.5 10*3/uL (ref 0.1–0.9)
NEUTROS PCT: 52 %
Neutrophils Absolute: 2.9 10*3/uL (ref 1.4–7.0)
PLATELETS: 321 10*3/uL (ref 150–379)
RBC: 5.12 x10E6/uL (ref 3.77–5.28)
RDW: 17 % — ABNORMAL HIGH (ref 12.3–15.4)
WBC: 5.5 10*3/uL (ref 3.4–10.8)

## 2017-08-03 LAB — TSH: TSH: 3.96 u[IU]/mL (ref 0.450–4.500)

## 2017-08-03 LAB — COMPREHENSIVE METABOLIC PANEL
ALBUMIN: 4.1 g/dL (ref 3.6–4.8)
ALT: 12 IU/L (ref 0–32)
AST: 18 IU/L (ref 0–40)
Albumin/Globulin Ratio: 1.6 (ref 1.2–2.2)
Alkaline Phosphatase: 70 IU/L (ref 39–117)
BUN / CREAT RATIO: 16 (ref 12–28)
BUN: 16 mg/dL (ref 8–27)
Bilirubin Total: 0.3 mg/dL (ref 0.0–1.2)
CALCIUM: 9.8 mg/dL (ref 8.7–10.3)
CO2: 23 mmol/L (ref 20–29)
Chloride: 108 mmol/L — ABNORMAL HIGH (ref 96–106)
Creatinine, Ser: 1.03 mg/dL — ABNORMAL HIGH (ref 0.57–1.00)
GFR, EST AFRICAN AMERICAN: 65 mL/min/{1.73_m2} (ref >59)
GFR, EST NON AFRICAN AMERICAN: 56 mL/min/{1.73_m2} — AB (ref >59)
GLOBULIN, TOTAL: 2.5 g/dL (ref 1.5–4.5)
Glucose: 91 mg/dL (ref 65–99)
Potassium: 5 mmol/L (ref 3.5–5.2)
Sodium: 144 mmol/L (ref 134–144)
TOTAL PROTEIN: 6.6 g/dL (ref 6.0–8.5)

## 2017-08-03 LAB — LIPID PANEL
CHOL/HDL RATIO: 4.8 ratio — AB (ref 0.0–4.4)
Cholesterol, Total: 233 mg/dL — ABNORMAL HIGH (ref 100–199)
HDL: 49 mg/dL (ref >39)
LDL Calculated: 161 mg/dL — ABNORMAL HIGH (ref 0–99)
Triglycerides: 116 mg/dL (ref 0–149)
VLDL Cholesterol Cal: 23 mg/dL (ref 5–40)

## 2017-10-15 ENCOUNTER — Ambulatory Visit
Admission: RE | Admit: 2017-10-15 | Discharge: 2017-10-15 | Disposition: A | Discharge status: Home / Self Care | Payer: PPO | Source: Ambulatory Visit | Attending: Family Medicine | Admitting: Family Medicine

## 2017-10-15 DIAGNOSIS — Z1231 Encounter for screening mammogram for malignant neoplasm of breast: Secondary | ICD-10-CM | POA: Insufficient documentation

## 2017-10-15 DIAGNOSIS — Z1239 Encounter for other screening for malignant neoplasm of breast: Secondary | ICD-10-CM

## 2017-10-16 ENCOUNTER — Other Ambulatory Visit: Payer: Self-pay | Admitting: *Deleted

## 2017-10-16 ENCOUNTER — Inpatient Hospital Stay
Admission: RE | Admit: 2017-10-16 | Discharge: 2017-10-16 | Disposition: A | Discharge status: Home / Self Care | Payer: Self-pay | Source: Ambulatory Visit | Attending: *Deleted | Admitting: *Deleted

## 2017-10-16 DIAGNOSIS — Z9289 Personal history of other medical treatment: Secondary | ICD-10-CM

## 2018-02-07 ENCOUNTER — Encounter: Payer: Self-pay | Admitting: Family Medicine

## 2018-02-07 ENCOUNTER — Ambulatory Visit (INDEPENDENT_AMBULATORY_CARE_PROVIDER_SITE_OTHER): Payer: PPO | Admitting: Family Medicine

## 2018-02-07 VITALS — BP 125/78 | HR 65 | Wt 173.8 lb

## 2018-02-07 DIAGNOSIS — K529 Noninfective gastroenteritis and colitis, unspecified: Secondary | ICD-10-CM | POA: Diagnosis not present

## 2018-02-07 MED ORDER — FLUTICASONE PROPIONATE 50 MCG/ACT NA SUSP: 2.0000 | Freq: Every day | NASAL | 6 refills | Status: DC

## 2018-02-07 NOTE — Progress Notes (Signed)
BP 125/78   Pulse 65   Wt 173 lb 12.8 oz (78.8 kg)   SpO2 99%   BMI 29.83 kg/m    Subjective:    Patient ID: Angela Martinez, female    DOB: [birth year], 67 y.o.   MRN: [id number]  HPI: Angela Martinez is a 67 y.o. female  Chief Complaint  Patient presents with  . Follow-up  . Emesis    w/ diahrrea x 1 week.   Patient accompanied by her husband who assists with history patient had 1 week of nausea vomiting diarrhea now getting better using some over-the-counter medication including Imodium.  This seems to be helping Has had no blood in her stool or urine or vomitus.  Has run some low-grade fever and systemic symptoms of generalized aching and felt feverish.  Has pretty much slowed down her nausea vomiting though still has problems with eating and has to eat small amounts.  Relevant past medical, surgical, family and social history reviewed and updated as indicated. Interim medical history since our last visit reviewed. Allergies and medications reviewed and updated.  Review of Systems  Constitutional: Negative.   Respiratory: Negative.   Cardiovascular: Negative.     Per HPI unless specifically indicated above     Objective:    BP 125/78   Pulse 65   Wt 173 lb 12.8 oz (78.8 kg)   SpO2 99%   BMI 29.83 kg/m   Wt Readings from Last 3 Encounters:  02/07/18 173 lb 12.8 oz (78.8 kg)  08/02/17 177 lb (80.3 kg)  07/05/17 174 lb 12.8 oz (79.3 kg)    Physical Exam  Constitutional: She is oriented to person, place, and time. She appears well-developed and well-nourished.  HENT:  Head: Normocephalic and atraumatic.  Eyes: Conjunctivae and EOM are normal.  Neck: Normal range of motion.  Cardiovascular: Normal rate, regular rhythm and normal heart sounds.  Pulmonary/Chest: Effort normal and breath sounds normal.  Musculoskeletal: Normal range of motion.  Neurological: She is alert and oriented to person, place, and time.  Skin: No erythema.  Psychiatric: She has a normal mood and  affect. Her behavior is normal. Judgment and thought content normal.    Results for orders placed or performed in visit on 08/02/17  Microscopic Examination  Result Value Ref Range   WBC, UA 0-5 0 - 5 /hpf   RBC, UA 0-2 0 - 2 /hpf   Epithelial Cells (non renal) 0-10 0 - 10 /hpf   Bacteria, UA None seen None seen/Few  CBC with Differential/Platelet  Result Value Ref Range   WBC 5.5 3.4 - 10.8 x10E3/uL   RBC 5.12 3.77 - 5.28 x10E6/uL   Hemoglobin 12.9 11.1 - 15.9 g/dL   Hematocrit 39.1 34.0 - 46.6 %   MCV 76 (L) 79 - 97 fL   MCH 25.2 (L) 26.6 - 33.0 pg   MCHC 33.0 31.5 - 35.7 g/dL   RDW 17.0 (H) 12.3 - 15.4 %   Platelets 321 150 - 379 x10E3/uL   Neutrophils 52 Not Estab. %   Lymphs 35 Not Estab. %   Monocytes 8 Not Estab. %   Eos 4 Not Estab. %   Basos 1 Not Estab. %   Neutrophils Absolute 2.9 1.4 - 7.0 x10E3/uL   Lymphocytes Absolute 1.9 0.7 - 3.1 x10E3/uL   Monocytes Absolute 0.5 0.1 - 0.9 x10E3/uL   EOS (ABSOLUTE) 0.2 0.0 - 0.4 x10E3/uL   Basophils Absolute 0.0 0.0 - 0.2 x10E3/uL   Immature  Granulocytes 0 Not Estab. %   Immature Grans (Abs) 0.0 0.0 - 0.1 x10E3/uL  Comprehensive metabolic panel  Result Value Ref Range   Glucose 91 65 - 99 mg/dL   BUN 16 8 - 27 mg/dL   Creatinine, Ser 1.03 (H) 0.57 - 1.00 mg/dL   GFR calc non Af Amer 56 (L) >59 mL/min/1.73   GFR calc Af Amer 65 >59 mL/min/1.73   BUN/Creatinine Ratio 16 12 - 28   Sodium 144 134 - 144 mmol/L   Potassium 5.0 3.5 - 5.2 mmol/L   Chloride 108 (H) 96 - 106 mmol/L   CO2 23 20 - 29 mmol/L   Calcium 9.8 8.7 - 10.3 mg/dL   Total Protein 6.6 6.0 - 8.5 g/dL   Albumin 4.1 3.6 - 4.8 g/dL   Globulin, Total 2.5 1.5 - 4.5 g/dL   Albumin/Globulin Ratio 1.6 1.2 - 2.2   Bilirubin Total 0.3 0.0 - 1.2 mg/dL   Alkaline Phosphatase 70 39 - 117 IU/L   AST 18 0 - 40 IU/L   ALT 12 0 - 32 IU/L  Lipid panel  Result Value Ref Range   Cholesterol, Total 233 (H) 100 - 199 mg/dL   Triglycerides 116 0 - 149 mg/dL   HDL 49 >39  mg/dL   VLDL Cholesterol Cal 23 5 - 40 mg/dL   LDL Calculated 161 (H) 0 - 99 mg/dL   Chol/HDL Ratio 4.8 (H) 0.0 - 4.4 ratio  TSH  Result Value Ref Range   TSH 3.960 0.450 - 4.500 uIU/mL  Urinalysis, Routine w reflex microscopic  Result Value Ref Range   Specific Gravity, UA 1.015 1.005 - 1.030   pH, UA 6.0 5.0 - 7.5   Color, UA Yellow Yellow   Appearance Ur Clear Clear   Leukocytes, UA Negative Negative   Protein, UA Negative Negative/Trace   Glucose, UA Negative Negative   Ketones, UA Negative Negative   RBC, UA Negative Negative   Bilirubin, UA Negative Negative   Urobilinogen, Ur 0.2 0.2 - 1.0 mg/dL   Nitrite, UA Negative Negative   Microscopic Examination See below:       Assessment & Plan:   Problem List Items Addressed This Visit    None    Visit Diagnoses    Gastroenteritis    -  Primary    Discussed gastroenteritis n.p.o. slow refeeding.  Discussed avoidance milk milk products.  Discussed use of over-the-counter medications including Imodium.  Discussed signs and symptoms of worrisome including blood in stool or vomitus.  Follow up plan: Return for As scheduled.

## 2018-07-10 ENCOUNTER — Ambulatory Visit: Payer: PPO

## 2018-07-26 ENCOUNTER — Other Ambulatory Visit: Payer: Self-pay | Admitting: Family Medicine

## 2018-07-26 ENCOUNTER — Ambulatory Visit (INDEPENDENT_AMBULATORY_CARE_PROVIDER_SITE_OTHER): Payer: PPO

## 2018-07-26 VITALS — BP 116/66 | HR 70 | Temp 97.7°F | Resp 16 | Ht 63.0 in | Wt 168.4 lb

## 2018-07-26 DIAGNOSIS — Z0000 Encounter for general adult medical examination without abnormal findings: Secondary | ICD-10-CM

## 2018-07-26 DIAGNOSIS — Z1231 Encounter for screening mammogram for malignant neoplasm of breast: Secondary | ICD-10-CM | POA: Diagnosis not present

## 2018-07-26 DIAGNOSIS — B351 Tinea unguium: Secondary | ICD-10-CM

## 2018-07-26 NOTE — Patient Instructions (Addendum)
Angela Martinez , Thank you for taking time to come for your Medicare Wellness Visit. I appreciate your ongoing commitment to your health goals. Please review the following plan we discussed and let me know if I can assist you in the future.   Screening recommendations/referrals: Colonoscopy: completed 04/28/2011 Mammogram: completed 10/15/2017 Bone Density: completed 09/13/2016 Recommended yearly ophthalmology/optometry visit for glaucoma screening and checkup Recommended yearly dental visit for hygiene and checkup  Vaccinations: Influenza vaccine: due now - declined today Pneumococcal vaccine: due now - declined today Tdap vaccine: up to date Shingles vaccine: not eligible   Advanced directives: Advance directive discussed with you today. I have provided a copy for you to complete at home and have notarized. Once this is complete please bring a copy in to our office so we can scan it into your chart.  Conditions/risks identified: Recommend continue drinking at least 6-8 glasses of water a day  Next appointment: Follow up in one year for your annual wellness exam.    Preventive Care 65 Years and Older, Female Preventive care refers to lifestyle choices and visits with your health care provider that can promote health and wellness. What does preventive care include?  A yearly physical exam. This is also called an annual well check.  Dental exams once or twice a year.  Routine eye exams. Ask your health care provider how often you should have your eyes checked.  Personal lifestyle choices, including:  Daily care of your teeth and gums.  Regular physical activity.  Eating a healthy diet.  Avoiding tobacco and drug use.  Limiting alcohol use.  Practicing safe sex.  Taking low-dose aspirin every day.  Taking vitamin and mineral supplements as recommended by your health care provider. What happens during an annual well check? The services and screenings done by your health care  provider during your annual well check will depend on your age, overall health, lifestyle risk factors, and family history of disease. Counseling  Your health care provider may ask you questions about your:  Alcohol use.  Tobacco use.  Drug use.  Emotional well-being.  Home and relationship well-being.  Sexual activity.  Eating habits.  History of falls.  Memory and ability to understand (cognition).  Work and work Statistician.  Reproductive health. Screening  You may have the following tests or measurements:  Height, weight, and BMI.  Blood pressure.  Lipid and cholesterol levels. These may be checked every 5 years, or more frequently if you are over 31 years old.  Skin check.  Lung cancer screening. You may have this screening every year starting at age 62 if you have a 30-pack-year history of smoking and currently smoke or have quit within the past 15 years.  Fecal occult blood test (FOBT) of the stool. You may have this test every year starting at age 28.  Flexible sigmoidoscopy or colonoscopy. You may have a sigmoidoscopy every 5 years or a colonoscopy every 10 years starting at age 6.  Hepatitis C blood test.  Hepatitis B blood test.  Sexually transmitted disease (STD) testing.  Diabetes screening. This is done by checking your blood sugar (glucose) after you have not eaten for a while (fasting). You may have this done every 1-3 years.  Bone density scan. This is done to screen for osteoporosis. You may have this done starting at age 61.  Mammogram. This may be done every 1-2 years. Talk to your health care provider about how often you should have regular mammograms. Talk with your  health care provider about your test results, treatment options, and if necessary, the need for more tests. Vaccines  Your health care provider may recommend certain vaccines, such as:  Influenza vaccine. This is recommended every year.  Tetanus, diphtheria, and acellular  pertussis (Tdap, Td) vaccine. You may need a Td booster every 10 years.  Zoster vaccine. You may need this after age 103.  Pneumococcal 13-valent conjugate (PCV13) vaccine. One dose is recommended after age 51.  Pneumococcal polysaccharide (PPSV23) vaccine. One dose is recommended after age 34. Talk to your health care provider about which screenings and vaccines you need and how often you need them. This information is not intended to replace advice given to you by your health care provider. Make sure you discuss any questions you have with your health care provider. Document Released: 12/03/2015 Document Revised: 07/26/2016 Document Reviewed: 09/07/2015 Elsevier Interactive Patient Education  2017 [place] Prevention in the Home Falls can cause injuries. They can happen to people of all ages. There are many things you can do to make your home safe and to help prevent falls. What can I do on the outside of my home?  Regularly fix the edges of walkways and driveways and fix any cracks.  Remove anything that might make you trip as you walk through a door, such as a raised step or threshold.  Trim any bushes or trees on the path to your home.  Use bright outdoor lighting.  Clear any walking paths of anything that might make someone trip, such as rocks or tools.  Regularly check to see if handrails are loose or broken. Make sure that both sides of any steps have handrails.  Any raised decks and porches should have guardrails on the edges.  Have any leaves, snow, or ice cleared regularly.  Use sand or salt on walking paths during winter.  Clean up any spills in your garage right away. This includes oil or grease spills. What can I do in the bathroom?  Use night lights.  Install grab bars by the toilet and in the tub and shower. Do not use towel bars as grab bars.  Use non-skid mats or decals in the tub or shower.  If you need to sit down in the shower, use a plastic,  non-slip stool.  Keep the floor dry. Clean up any water that spills on the floor as soon as it happens.  Remove soap buildup in the tub or shower regularly.  Attach bath mats securely with double-sided non-slip rug tape.  Do not have throw rugs and other things on the floor that can make you trip. What can I do in the bedroom?  Use night lights.  Make sure that you have a light by your bed that is easy to reach.  Do not use any sheets or blankets that are too big for your bed. They should not hang down onto the floor.  Have a firm chair that has side arms. You can use this for support while you get dressed.  Do not have throw rugs and other things on the floor that can make you trip. What can I do in the kitchen?  Clean up any spills right away.  Avoid walking on wet floors.  Keep items that you use a lot in easy-to-reach places.  If you need to reach something above you, use a strong step stool that has a grab bar.  Keep electrical cords out of the way.  Do not use  floor polish or wax that makes floors slippery. If you must use wax, use non-skid floor wax.  Do not have throw rugs and other things on the floor that can make you trip. What can I do with my stairs?  Do not leave any items on the stairs.  Make sure that there are handrails on both sides of the stairs and use them. Fix handrails that are broken or loose. Make sure that handrails are as long as the stairways.  Check any carpeting to make sure that it is firmly attached to the stairs. Fix any carpet that is loose or worn.  Avoid having throw rugs at the top or bottom of the stairs. If you do have throw rugs, attach them to the floor with carpet tape.  Make sure that you have a light switch at the top of the stairs and the bottom of the stairs. If you do not have them, ask someone to add them for you. What else can I do to help prevent falls?  Wear shoes that:  Do not have high heels.  Have rubber  bottoms.  Are comfortable and fit you well.  Are closed at the toe. Do not wear sandals.  If you use a stepladder:  Make sure that it is fully opened. Do not climb a closed stepladder.  Make sure that both sides of the stepladder are locked into place.  Ask someone to hold it for you, if possible.  Clearly mark and make sure that you can see:  Any grab bars or handrails.  First and last steps.  Where the edge of each step is.  Use tools that help you move around (mobility aids) if they are needed. These include:  Canes.  Walkers.  Scooters.  Crutches.  Turn on the lights when you go into a dark area. Replace any light bulbs as soon as they burn out.  Set up your furniture so you have a clear path. Avoid moving your furniture around.  If any of your floors are uneven, fix them.  If there are any pets around you, be aware of where they are.  Review your medicines with your doctor. Some medicines can make you feel dizzy. This can increase your chance of falling. Ask your doctor what other things that you can do to help prevent falls. This information is not intended to replace advice given to you by your health care provider. Make sure you discuss any questions you have with your health care provider. Document Released: 09/02/2009 Document Revised: 04/13/2016 Document Reviewed: 12/11/2014 Elsevier Interactive Patient Education  2017 [employer].

## 2018-07-26 NOTE — Progress Notes (Signed)
Subjective:   Angela Martinez is a 67 y.o. female who presents for Medicare Annual (Subsequent) preventive examination.  Review of Systems:   Cardiac Risk Factors include: hypertension;advanced age (>10men, >67 women);dyslipidemia     Objective:     Vitals: BP 116/66 (BP Location: Left Arm, Patient Position: Sitting)   Pulse 70   Temp 97.7 F (36.5 C) (Temporal)   Resp 16   Ht 5\' 3"  (1.6 m)   Wt 168 lb 6.4 oz (76.4 kg)   BMI 29.83 kg/m   Body mass index is 29.83 kg/m.  Advanced Directives 07/26/2018 07/05/2017 05/18/2016 03/16/2016  Does Patient Have a Medical Advance Directive? No No No No  Would patient like information on creating a medical advance directive? Yes (MAU/Ambulatory/Procedural Areas - Information given) Yes (MAU/Ambulatory/Procedural Areas - Information given) No - patient declined information Yes - Educational materials given    Tobacco Social History   Tobacco Use  Smoking Status Never Smoker  Smokeless Tobacco Never Used     Counseling given: Not Answered   Clinical Intake:  Pre-visit preparation completed: Yes  Pain : No/denies pain     Nutritional Status: BMI 25 -29 Overweight Nutritional Risks: None Diabetes: No  How often do you need to have someone help you when you read instructions, pamphlets, or other written materials from your doctor or pharmacy?: 1 - Never What is the last grade level you completed in school?: 12th grade  Interpreter Needed?: No  Information entered by :: Angela Dols,LPN   Past Medical History:  Diagnosis Date  . Anxiety   . Complication of anesthesia    WITH WRIST SURGERY HARD TO GET RELAXED. OK WITH KNEE / BACK  . Cough   . Depression   . Fibromyalgia   . GERD (gastroesophageal reflux disease)   . Herpes simplex    1 AND 2  . Hyperlipidemia   . OA (osteoarthritis)   . Osteopenia   . Overweight   . Plantar fasciitis   . Shortness of breath dyspnea    DOE  . Tremors of nervous system    Past  Surgical History:  Procedure Laterality Date  . BACK SURGERY    . BUNIONECTOMY    . CATARACT EXTRACTION W/PHACO Right 03/16/2016   Procedure: CATARACT EXTRACTION PHACO AND INTRAOCULAR LENS PLACEMENT (IOC);  Surgeon: Leandrew Koyanagi, MD;  Location: [facility] ORS;  Service: Ophthalmology;  Laterality: Right;  [place] 52.4 AP% 8.8 CDE 4.63 Fluid Pack Lot # [id number]  . CATARACT EXTRACTION W/PHACO Left 05/18/2016   Procedure: CATARACT EXTRACTION PHACO AND INTRAOCULAR LENS PLACEMENT ([place]);  Surgeon: Leandrew Koyanagi, MD;  Location: [facility] ORS;  Service: Ophthalmology;  Laterality: Left;  [place] 47.3 AP% 10.4 CDE 4.90 Fluid pack lot # [id number]  . DILATION AND CURETTAGE OF UTERUS     x 3  . JOINT REPLACEMENT    . LUMBAR FUSION     L4-L5 with cage  . TOTAL KNEE ARTHROPLASTY Left 2007  . WRIST SURGERY     Family History  Problem Relation Age of Onset  . Alcohol abuse Father   . Alcohol abuse Sister   . Arthritis Sister        5 sisters, some with RA  . Seizures Sister   . Cancer Sister        breast  . Diabetes Brother   . Breast cancer Neg Hx    Social History   Socioeconomic History  . Marital status: Married    Spouse name: Not on  file  . Number of children: Not on file  . Years of education: Not on file  . Highest education level: High school graduate  Occupational History  . Not on file  Social Needs  . Financial resource strain: Not hard at all  . Food insecurity:    Worry: Never true    Inability: Never true  . Transportation needs:    Medical: No    Non-medical: No  Tobacco Use  . Smoking status: Never Smoker  . Smokeless tobacco: Never Used  Substance and Sexual Activity  . Alcohol use: No  . Drug use: No  . Sexual activity: Not on file  Lifestyle  . Physical activity:    Days per week: 0 days    Minutes per session: 0 min  . Stress: Not at all  Relationships  . Social connections:    Talks on phone: Not on file    Gets together: Not on file    Attends  religious service: More than 4 times per year    Active member of club or organization: Not on file    Attends meetings of clubs or organizations: Not on file    Relationship status: Married  Other Topics Concern  . Not on file  Social History Narrative  . Not on file    Outpatient Encounter Medications as of 07/26/2018  Medication Sig  . BIOTIN PO Take by mouth daily.  Marland Kitchen docusate sodium (COLACE) 50 MG capsule Take 50 mg by mouth daily.   Marland Kitchen FLUoxetine (PROZAC) 20 MG tablet Take 1 tablet (20 mg total) by mouth daily.  . fluticasone (FLONASE) 50 MCG/ACT nasal spray Place 2 sprays into both nostrils daily.  Marland Kitchen gabapentin (NEURONTIN) 300 MG capsule Take 1 capsule (300 mg total) by mouth 2 (two) times daily.  Marland Kitchen omeprazole (PRILOSEC) 20 MG capsule Take 1 capsule (20 mg total) by mouth daily. Please call and make an appointment ASAP  . Vitamin D, Cholecalciferol, 400 UNITS TABS Take 1,000 Units by mouth.   . loratadine (CLARITIN) 10 MG tablet Take 10 mg by mouth daily.  Marland Kitchen terbinafine (LAMISIL) 250 MG tablet Take 1 tablet (250 mg total) by mouth daily. (Patient not taking: Reported on 07/26/2018)  . valACYclovir (VALTREX) 500 MG tablet Take 500 mg by mouth as needed.   No facility-administered encounter medications on file as of 07/26/2018.     Activities of Daily Living In your present state of health, do you have any difficulty performing the following activities: 07/26/2018  Hearing? N  Vision? N  Difficulty concentrating or making decisions? Y  Walking or climbing stairs? N  Dressing or bathing? N  Doing errands, shopping? N  Preparing Food and eating ? N  Using the Toilet? N  In the past six months, have you accidently leaked urine? Y  Comment wears protection as needed  Do you have problems with loss of bowel control? N  Managing your Medications? N  Managing your Finances? N  Housekeeping or managing your Housekeeping? N  Some recent data might be hidden    Patient Care  Team: Guadalupe Maple, MD as PCP - General (Family Medicine) Beverly Gust, MD (Unknown Physician Specialty)    Assessment:   This is a routine wellness examination for [place].  Exercise Activities and Dietary recommendations Current Exercise Habits: The patient does not participate in regular exercise at present, Exercise limited by: None identified  Goals    . DIET - INCREASE WATER INTAKE  Recommend continue drinking at least 6-8 glasses of water a day       Fall Risk Fall Risk  07/26/2018 02/07/2018 07/05/2017 01/04/2017 07/04/2016  Falls in the past year? No No No No No   Is the patient's home free of loose throw rugs in walkways, pet beds, electrical cords, etc?   yes      Grab bars in the bathroom? yes      Handrails on the stairs?   no stairs       Adequate lighting?   yes  Timed Get Up and Go performed: Completed in 8 seconds with no use of assistive devices, steady gait. No intervention needed at this time.   Depression Screen PHQ 2/9 Scores 07/26/2018 07/05/2017 07/04/2016 06/09/2015  PHQ - 2 Score 0 0 2 1     Cognitive Function     6CIT Screen 07/26/2018 07/05/2017  What Year? 0 points 0 points  What month? 0 points 0 points  What time? 0 points 0 points  Count back from 20 0 points 0 points  Months in reverse 0 points 0 points  Repeat phrase 2 points 2 points  Total Score 2 2    Immunization History  Administered Date(s) Administered  . Influenza,inj,Quad PF,6+ Mos 08/27/2017  . Influenza-Unspecified 08/20/2014, 09/08/2015, 09/21/2016  . Tdap 07/16/2012    Qualifies for Shingles Vaccine? No   Screening Tests Health Maintenance  Topic Date Due  . PNA vac Low Risk Adult (1 of 2 - PCV13) 12/31/2013  . INFLUENZA VACCINE  06/20/2018  . MAMMOGRAM  10/16/2019  . COLONOSCOPY  04/27/2021  . TETANUS/TDAP  07/16/2022  . DEXA SCAN  Completed  . Hepatitis C Screening  Completed    Declined pneumo vaccines today Declined flu vaccine today   Cancer  Screenings: Lung: Low Dose CT Chest recommended if Age 76-80 years, 30 pack-year currently smoking OR have quit w/in 15years. Patient does not qualify. Breast:  Up to date on Mammogram? Yes  10/15/2017 Up to date of Bone Density/Dexa? Yes 09/13/2016 Colorectal: completed 04/28/2011  Additional Screenings:  Hepatitis C Screening:  Completed 07/04/2016     Plan:    I have personally reviewed and addressed the Medicare Annual Wellness questionnaire and have noted the following in the patient's chart:  A. Medical and social history B. Use of alcohol, tobacco or illicit drugs  C. Current medications and supplements D. Functional ability and status E.  Nutritional status F.  Physical activity G. Advance directives H. List of other physicians I.  Hospitalizations, surgeries, and ER visits in previous 12 months J.  [place] such as hearing and vision if needed, cognitive and depression L. Referrals and appointments   In addition, I have reviewed and discussed with patient certain preventive protocols, quality metrics, and best practice recommendations. A written personalized care plan for preventive services as well as general preventive health recommendations were provided to patient.   Signed,  Tyler Aas, LPN Nurse Health Advisor   Nurse Notes: Patient states she is having some vaginal pressure, she is concerned with ovarian cancer as she has had a lot of friends that have passed recently with ovarian cancer. She denies having any right or left sided pelvic discomfort and no other symptoms to report at this time.

## 2018-08-02 ENCOUNTER — Ambulatory Visit (INDEPENDENT_AMBULATORY_CARE_PROVIDER_SITE_OTHER): Payer: PPO | Admitting: Unknown Physician Specialty

## 2018-08-02 ENCOUNTER — Encounter: Payer: Self-pay | Admitting: Unknown Physician Specialty

## 2018-08-02 DIAGNOSIS — F32A Depression, unspecified: Secondary | ICD-10-CM

## 2018-08-02 DIAGNOSIS — M797 Fibromyalgia: Secondary | ICD-10-CM | POA: Diagnosis not present

## 2018-08-02 DIAGNOSIS — B351 Tinea unguium: Secondary | ICD-10-CM | POA: Diagnosis not present

## 2018-08-02 DIAGNOSIS — F329 Major depressive disorder, single episode, unspecified: Secondary | ICD-10-CM | POA: Diagnosis not present

## 2018-08-02 DIAGNOSIS — K219 Gastro-esophageal reflux disease without esophagitis: Secondary | ICD-10-CM

## 2018-08-02 DIAGNOSIS — L309 Dermatitis, unspecified: Secondary | ICD-10-CM

## 2018-08-02 MED ORDER — CLOTRIMAZOLE-BETAMETHASONE 1-0.05 % EX CREA: 1.0000 "application " | TOPICAL_CREAM | Freq: Two times a day (BID) | CUTANEOUS | 0 refills | Status: DC

## 2018-08-02 MED ORDER — FLUOXETINE HCL 20 MG PO TABS: 20.0000 mg | ORAL_TABLET | Freq: Every day | ORAL | 4 refills | Status: DC

## 2018-08-02 MED ORDER — ITRACONAZOLE 100 MG PO CAPS: ORAL_CAPSULE | ORAL | 0 refills | Status: DC

## 2018-08-02 MED ORDER — OMEPRAZOLE 20 MG PO CPDR: 20.0000 mg | DELAYED_RELEASE_CAPSULE | Freq: Every day | ORAL | 4 refills | Status: DC

## 2018-08-02 NOTE — Assessment & Plan Note (Addendum)
Not help with Lamisil for 3 months.  Discussed with patient that repeating the same medication would not be expected to help.   Will try Sporanox weekly for 3 months.  Will stop taking Omeprazole while taking the medication

## 2018-08-02 NOTE — Assessment & Plan Note (Signed)
Of ear.  Small amt of Lotrisone for only 2 weeks.  If no improvement, will refer to dermatology

## 2018-08-02 NOTE — Assessment & Plan Note (Signed)
Stable, continue present medications.   

## 2018-08-02 NOTE — Progress Notes (Signed)
BP 133/80   Pulse 77   Ht 5\' 3"  (1.6 m)   Wt 168 lb (76.2 kg)   SpO2 97%   BMI 29.76 kg/m    Subjective:    Patient ID: Angela Martinez, female    DOB: 08-20-1951, 67 y.o.   MRN: [id number]  HPI: Angela Martinez is a 67 y.o. female  Chief Complaint  Patient presents with  . Follow-up   Patient is here for 2 issues.  Number one is having trouble with Onychomycosis of toes and would like some medication.  Review of chart shows got Lamisil last year, took it for 3 months which didn't seem to help  She is also having flaking of her right ear which is persistent and hasn't been looked at.    Fibromyalgia Taking Gabapentin and would like a refill. Takes 1-2/day which helps quite a bit  GERD Would like a refill of Omeprazole which controls her GERD.    Depression Taking Fluoxetine for depression and doing well.   Depression screen [facility] 2/9 08/02/2018 07/26/2018 07/05/2017 07/04/2016 06/09/2015  Decreased Interest 0 0 0 1 0  Down, Depressed, Hopeless 0 0 0 1 1  PHQ - 2 Score 0 0 0 2 1  Altered sleeping 0 - - - -  Tired, decreased energy 0 - - - -  Change in appetite 0 - - - -  Feeling bad or failure about yourself  0 - - - -  Trouble concentrating 0 - - - -  Moving slowly or fidgety/restless 0 - - - -  Suicidal thoughts 0 - - - -  PHQ-9 Score 0 - - - -  Difficult doing work/chores Not difficult at all - - - -    No labs done for 1 year.  She is on a list for a physical with Dr. Jeananne Martinez.    Relevant past medical, surgical, family and social history reviewed and updated as indicated. Interim medical history since our last visit reviewed. Allergies and medications reviewed and updated.  Review of Systems  Per HPI unless specifically indicated above     Objective:    BP 133/80   Pulse 77   Ht 5\' 3"  (1.6 m)   Wt 168 lb (76.2 kg)   SpO2 97%   BMI 29.76 kg/m   Wt Readings from Last 3 Encounters:  08/02/18 168 lb (76.2 kg)  07/26/18 168 lb 6.4 oz (76.4 kg)  02/07/18 173 lb  12.8 oz (78.8 kg)    Physical Exam  Constitutional: She is oriented to person, place, and time. She appears well-developed and well-nourished. No distress.  HENT:  Head: Normocephalic and atraumatic.  Eyes: Conjunctivae and lids are normal. Right eye exhibits no discharge. Left eye exhibits no discharge. No scleral icterus.  Neck: Normal range of motion. Neck supple. No JVD present. Carotid bruit is not present.  Cardiovascular: Normal rate, regular rhythm and normal heart sounds.  Pulmonary/Chest: Effort normal and breath sounds normal.  Abdominal: Normal appearance. There is no splenomegaly or hepatomegaly.  Musculoskeletal: Normal range of motion.  Neurological: She is alert and oriented to person, place, and time.  Skin: Skin is warm, dry and intact. No rash noted. No pallor.  Second 2 toes with discoloration.  Right ear without lesion.  Mostly dry and rough outer auricle  Psychiatric: She has a normal mood and affect. Her behavior is normal. Judgment and thought content normal.    Results for orders placed or performed in visit on  08/02/17  Microscopic Examination  Result Value Ref Range   WBC, UA 0-5 0 - 5 /hpf   RBC, UA 0-2 0 - 2 /hpf   Epithelial Cells (non renal) 0-10 0 - 10 /hpf   Bacteria, UA None seen None seen/Few  CBC with Differential/Platelet  Result Value Ref Range   WBC 5.5 3.4 - 10.8 x10E3/uL   RBC 5.12 3.77 - 5.28 x10E6/uL   Hemoglobin 12.9 11.1 - 15.9 g/dL   Hematocrit 39.1 34.0 - 46.6 %   MCV 76 (L) 79 - 97 fL   MCH 25.2 (L) 26.6 - 33.0 pg   MCHC 33.0 31.5 - 35.7 g/dL   RDW 17.0 (H) 12.3 - 15.4 %   Platelets 321 150 - 379 x10E3/uL   Neutrophils 52 Not Estab. %   Lymphs 35 Not Estab. %   Monocytes 8 Not Estab. %   Eos 4 Not Estab. %   Basos 1 Not Estab. %   Neutrophils Absolute 2.9 1.4 - 7.0 x10E3/uL   Lymphocytes Absolute 1.9 0.7 - 3.1 x10E3/uL   Monocytes Absolute 0.5 0.1 - 0.9 x10E3/uL   EOS (ABSOLUTE) 0.2 0.0 - 0.4 x10E3/uL   Basophils Absolute  0.0 0.0 - 0.2 x10E3/uL   Immature Granulocytes 0 Not Estab. %   Immature Grans (Abs) 0.0 0.0 - 0.1 x10E3/uL  Comprehensive metabolic panel  Result Value Ref Range   Glucose 91 65 - 99 mg/dL   BUN 16 8 - 27 mg/dL   Creatinine, Ser 1.03 (H) 0.57 - 1.00 mg/dL   GFR calc non Af Amer 56 (L) >59 mL/min/1.73   GFR calc Af Amer 65 >59 mL/min/1.73   BUN/Creatinine Ratio 16 12 - 28   Sodium 144 134 - 144 mmol/L   Potassium 5.0 3.5 - 5.2 mmol/L   Chloride 108 (H) 96 - 106 mmol/L   CO2 23 20 - 29 mmol/L   Calcium 9.8 8.7 - 10.3 mg/dL   Total Protein 6.6 6.0 - 8.5 g/dL   Albumin 4.1 3.6 - 4.8 g/dL   Globulin, Total 2.5 1.5 - 4.5 g/dL   Albumin/Globulin Ratio 1.6 1.2 - 2.2   Bilirubin Total 0.3 0.0 - 1.2 mg/dL   Alkaline Phosphatase 70 39 - 117 IU/L   AST 18 0 - 40 IU/L   ALT 12 0 - 32 IU/L  Lipid panel  Result Value Ref Range   Cholesterol, Total 233 (H) 100 - 199 mg/dL   Triglycerides 116 0 - 149 mg/dL   HDL 49 >39 mg/dL   VLDL Cholesterol Cal 23 5 - 40 mg/dL   LDL Calculated 161 (H) 0 - 99 mg/dL   Chol/HDL Ratio 4.8 (H) 0.0 - 4.4 ratio  TSH  Result Value Ref Range   TSH 3.960 0.450 - 4.500 uIU/mL  Urinalysis, Routine w reflex microscopic  Result Value Ref Range   Specific Gravity, UA 1.015 1.005 - 1.030   pH, UA 6.0 5.0 - 7.5   Color, UA Yellow Yellow   Appearance Ur Clear Clear   Leukocytes, UA Negative Negative   Protein, UA Negative Negative/Trace   Glucose, UA Negative Negative   Ketones, UA Negative Negative   RBC, UA Negative Negative   Bilirubin, UA Negative Negative   Urobilinogen, Ur 0.2 0.2 - 1.0 mg/dL   Nitrite, UA Negative Negative   Microscopic Examination See below:       Assessment & Plan:   Problem List Items Addressed This Visit      Unprioritized  Depression    Stable, continue present medications.        Relevant Medications   FLUoxetine (PROZAC) 20 MG tablet   Eczema    Of ear.  Small amt of Lotrisone for only 2 weeks.  If no improvement,  will refer to dermatology      Fibromyalgia    Stable, continue present medications.        GERD (gastroesophageal reflux disease)    Stable, continue present medications.        Relevant Medications   omeprazole (PRILOSEC) 20 MG capsule   Onychomycosis    Not help with Lamisil for 3 months.  Discussed with patient that repeating the same medication would not be expected to help.   Will try Sporanox weekly for 3 months      Relevant Medications   clotrimazole-betamethasone (LOTRISONE) cream   itraconazole (SPORANOX) 100 MG capsule         Follow up plan: Return for F/u with Jolene for a physical.

## 2018-08-08 ENCOUNTER — Telehealth: Payer: Self-pay | Admitting: Family Medicine

## 2018-08-08 DIAGNOSIS — B351 Tinea unguium: Secondary | ICD-10-CM

## 2018-08-08 NOTE — Telephone Encounter (Signed)
Copied from [place] [id number]. Topic: Quick Communication - See Telephone Encounter >> Aug 08, 2018  9:33 AM Bea Graff, NT wrote: CRM for notification. See Telephone encounter for: 08/08/18. Pt states that the medication itraconazole (SPORANOX) 100 MG capsule needs prior authorization. May leave message.

## 2018-08-08 NOTE — Telephone Encounter (Signed)
Called and spoke to Baker at [facility] [phone]). Must have a confirmed diagnosis with lab testing before medication will be approved. He suggested a nail biopsy or other physical test. Please advise.

## 2018-08-08 NOTE — Telephone Encounter (Signed)
We will not do nail biopsies- we can refer her to podiatry if she'd like.

## 2018-08-08 NOTE — Telephone Encounter (Signed)
Attempted to reach patient. Line continuously rang.  

## 2018-08-09 NOTE — Addendum Note (Signed)
Addended by: Valerie Roys on: 08/09/2018 10:54 AM   Modules accepted: Orders

## 2018-08-09 NOTE — Telephone Encounter (Signed)
Patient agreeable to podiatry.

## 2018-08-23 ENCOUNTER — Encounter: Payer: Self-pay | Admitting: Podiatry

## 2018-08-23 ENCOUNTER — Ambulatory Visit: Payer: PPO | Admitting: Podiatry

## 2018-08-23 DIAGNOSIS — H5789 Other specified disorders of eye and adnexa: Secondary | ICD-10-CM | POA: Diagnosis not present

## 2018-08-23 DIAGNOSIS — Z79899 Other long term (current) drug therapy: Secondary | ICD-10-CM | POA: Diagnosis not present

## 2018-08-23 DIAGNOSIS — L608 Other nail disorders: Secondary | ICD-10-CM | POA: Diagnosis not present

## 2018-08-23 DIAGNOSIS — L603 Nail dystrophy: Secondary | ICD-10-CM | POA: Diagnosis not present

## 2018-08-24 LAB — HEPATIC FUNCTION PANEL
ALBUMIN: 4.4 g/dL (ref 3.6–4.8)
ALT: 8 IU/L (ref 0–32)
AST: 16 IU/L (ref 0–40)
Alkaline Phosphatase: 64 IU/L (ref 39–117)
BILIRUBIN TOTAL: 0.3 mg/dL (ref 0.0–1.2)
BILIRUBIN, DIRECT: 0.12 mg/dL (ref 0.00–0.40)
TOTAL PROTEIN: 6.7 g/dL (ref 6.0–8.5)

## 2018-08-25 NOTE — Progress Notes (Signed)
   Subjective: 67 year old female presenting today as a new patient with a chief complaint of tenderness to the bilateral 2nd toenails that began 3-4 years. She reports associated thickening and discoloration. She has been treated for fungus in the past by her PCP with no significant relief. She states she has been keeping them trimmed for treatment since then. Patient is here for further evaluation and treatment.   Past Medical History:  Diagnosis Date  . Anxiety   . Complication of anesthesia    WITH WRIST SURGERY HARD TO GET RELAXED. OK WITH KNEE / BACK  . Cough   . Depression   . Fibromyalgia   . GERD (gastroesophageal reflux disease)   . Herpes simplex    1 AND 2  . Hyperlipidemia   . OA (osteoarthritis)   . Osteopenia   . Overweight   . Plantar fasciitis   . Shortness of breath dyspnea    DOE  . Tremors of nervous system     Objective: Physical Exam General: The patient is alert and oriented x3 in no acute distress.  Dermatology: Hyperkeratotic, discolored, thickened, onychodystrophy of 2nd toenails noted bilaterally. Skin is warm, dry and supple bilateral lower extremities. Negative for open lesions or macerations.  Vascular: Palpable pedal pulses bilaterally. No edema or erythema noted. Capillary refill within normal limits.  Neurological: Epicritic and protective threshold grossly intact bilaterally.   Musculoskeletal Exam: Range of motion within normal limits to all pedal and ankle joints bilateral. Muscle strength 5/5 in all groups bilateral.   Assessment: #1 Onychomycosis bilateral 2nd toes #2 hyperkeratotic nails bilateral 2nd toes   Plan of Care:  #1 Patient was evaluated. #2 Orders for liver function tests were placed today.  #3 Today nail biopsy was taken and sent to pathology for fungal culture. #4 If biopsy is positive for fungus, I will call in prescription for Lamisil 250 mg #90 for patient.  #5 Return to clinic as needed.    Edrick Kins,  DPM [facility]  Dr. Edrick Kins, [address]                                        [place], [place] [address]                Office [phone]  Fax [phone]

## 2018-09-02 ENCOUNTER — Other Ambulatory Visit: Payer: Self-pay

## 2018-09-02 ENCOUNTER — Ambulatory Visit (INDEPENDENT_AMBULATORY_CARE_PROVIDER_SITE_OTHER): Payer: PPO | Admitting: Nurse Practitioner

## 2018-09-02 ENCOUNTER — Encounter: Payer: Self-pay | Admitting: Nurse Practitioner

## 2018-09-02 VITALS — BP 125/84 | HR 71 | Temp 97.8°F | Ht 63.0 in | Wt 167.3 lb

## 2018-09-02 DIAGNOSIS — E7800 Pure hypercholesterolemia, unspecified: Secondary | ICD-10-CM | POA: Diagnosis not present

## 2018-09-02 DIAGNOSIS — F3342 Major depressive disorder, recurrent, in full remission: Secondary | ICD-10-CM | POA: Diagnosis not present

## 2018-09-02 DIAGNOSIS — B351 Tinea unguium: Secondary | ICD-10-CM | POA: Diagnosis not present

## 2018-09-02 DIAGNOSIS — Z0000 Encounter for general adult medical examination without abnormal findings: Secondary | ICD-10-CM

## 2018-09-02 DIAGNOSIS — M150 Primary generalized (osteo)arthritis: Secondary | ICD-10-CM | POA: Diagnosis not present

## 2018-09-02 DIAGNOSIS — M159 Polyosteoarthritis, unspecified: Secondary | ICD-10-CM

## 2018-09-02 DIAGNOSIS — I10 Essential (primary) hypertension: Secondary | ICD-10-CM

## 2018-09-02 DIAGNOSIS — M8949 Other hypertrophic osteoarthropathy, multiple sites: Secondary | ICD-10-CM

## 2018-09-02 NOTE — Assessment & Plan Note (Signed)
Chronic, ongoing.  Well-managed with Gabapentin.  Continue current medication regimen and monitor.

## 2018-09-02 NOTE — Progress Notes (Signed)
BP 125/84   Pulse 71   Temp 97.8 F (36.6 C) (Oral)   Ht 5\' 3"  (1.6 m)   Wt 167 lb 4.8 oz (75.9 kg)   SpO2 97%   BMI 29.64 kg/m    Subjective:    Patient ID: Angela Martinez, female    DOB: 05-Jun-1951, 67 y.o.   MRN: [id number]  HPI: Angela Martinez is a 67 y.o. female presents for annual physical.  Chief Complaint  Patient presents with  . Annual Exam    pt would like to discuss about her toes   HYPERTENSION / HYPERLIPIDEMIA Satisfied with current treatment? yes, currently does not take any medications Duration of hypertension: chronic BP monitoring frequency: rarely BP range:  BP medication side effects: no Past BP meds: none Duration of hyperlipidemia: chronic Cholesterol medication side effects: no Cholesterol supplements: none Past cholesterol medications: none, prefers not to take medication Medication compliance: Currently no medications, exercises and diet control Aspirin: no Recent stressors: no Recurrent headaches: no Visual changes: no Palpitations: no Dyspnea: no Chest pain: no Lower extremity edema: no Dizzy/lightheaded: no Reports she does exercise and eat a heart healthy diet at home.  Osteoarthritis: Chronic, ongoing.  Takes Gabapentin at home, which she reports controls her pain.  Also reports eating black cherries and blueberries at home for anti-inflammatory effect.  She prefers not taking medication unless needed.  When pain present notices it in feet and neck/shoulders + hands.  Reports minimal pain on a daily basis.  Encouraged her to perform chair yoga at home for stretching.  Does a lot of gardening and mowing grass at home.  Denies pain today.  DEPRESSION Mood status: controlled Satisfied with current treatment?: yes Symptom severity: mild  Duration of current treatment : chronic Side effects: no Medication compliance: excellent compliance Psychotherapy/counseling: no none Previous psychiatric medications: none Depressed mood: no Anxious  mood: no Anhedonia: no Significant weight loss or gain: no Insomnia: no no issues with sleeping Fatigue: no Feelings of worthlessness or guilt: no Impaired concentration/indecisiveness: no Suicidal ideations: no Hopelessness: no Crying spells: no Depression screen [facility] 2/9 09/02/2018 08/02/2018 07/26/2018 07/05/2017 07/04/2016  Decreased Interest 0 0 0 0 1  Down, Depressed, Hopeless 1 0 0 0 1  PHQ - 2 Score 1 0 0 0 2  Altered sleeping 0 0 - - -  Tired, decreased energy 1 0 - - -  Change in appetite 0 0 - - -  Feeling bad or failure about yourself  0 0 - - -  Trouble concentrating 0 0 - - -  Moving slowly or fidgety/restless 0 0 - - -  Suicidal thoughts 0 0 - - -  PHQ-9 Score 2 0 - - -  Difficult doing work/chores Not difficult at all Not difficult at all - - -   Onychomycosis: Wanted to discuss recent podiatry recommendation to take medication for toes.  Patient reports she does not like taking medication unless she needs to and would prefer not to take medication for her toes, as she feels they are "okay and not bothering me right now".  She denies any itching, burning, drainage, or irritation to toes at this time.  Reports that often it is her second toe on both feet that cause issues when present.    Relevant past medical, surgical, family and social history reviewed and updated as indicated. Interim medical history since our last visit reviewed. Allergies and medications reviewed and updated.  Review of Systems  Constitutional: Negative for  activity change, appetite change and fatigue.  HENT: Negative for congestion, ear pain, mouth sores, nosebleeds, postnasal drip, rhinorrhea, sinus pressure and sinus pain.   Eyes: Negative for pain, itching and visual disturbance.  Respiratory: Negative for cough, chest tightness and shortness of breath.   Cardiovascular: Negative for chest pain, palpitations and leg swelling.  Gastrointestinal: Negative for abdominal distention, abdominal pain,  constipation and diarrhea.  Endocrine: Negative for cold intolerance, polydipsia, polyphagia and polyuria.  Genitourinary: Negative.   Musculoskeletal: Negative.   Skin: Negative.   Allergic/Immunologic: Negative.   Neurological: Negative for dizziness, numbness and headaches.  Hematological: Negative.   Psychiatric/Behavioral: Negative.     Per HPI unless specifically indicated above     Objective:    BP 125/84   Pulse 71   Temp 97.8 F (36.6 C) (Oral)   Ht 5\' 3"  (1.6 m)   Wt 167 lb 4.8 oz (75.9 kg)   SpO2 97%   BMI 29.64 kg/m   Wt Readings from Last 3 Encounters:  09/02/18 167 lb 4.8 oz (75.9 kg)  08/02/18 168 lb (76.2 kg)  07/26/18 168 lb 6.4 oz (76.4 kg)    Physical Exam  Constitutional: She is oriented to person, place, and time. She appears well-developed and well-nourished.  HENT:  Head: Normocephalic and atraumatic.  Right Ear: External ear normal.  Left Ear: External ear normal.  Nose: Nose normal.  Mouth/Throat: Oropharynx is clear and moist.  Eyes: Pupils are equal, round, and reactive to light. Conjunctivae and EOM are normal.  Neck: Normal range of motion. Neck supple.  Cardiovascular: Normal rate, regular rhythm, normal heart sounds and intact distal pulses.  Pulses:      Dorsalis pedis pulses are 2+ on the right side, and 2+ on the left side.       Posterior tibial pulses are 2+ on the right side, and 2+ on the left side.  Pulmonary/Chest: Effort normal and breath sounds normal. Right breast exhibits no inverted nipple, no mass and no tenderness. Left breast exhibits no inverted nipple, no mass and no tenderness. No breast discharge.  Abdominal: Soft. Bowel sounds are normal.  Musculoskeletal: Normal range of motion.       Right foot: There is no deformity.       Left foot: There is no deformity.  Feet:  Right Foot:  Skin Integrity: Negative for blister, skin breakdown or erythema.  Left Foot:  Skin Integrity: Negative for blister, skin breakdown or  erythema.  Neurological: She is alert and oriented to person, place, and time.  Skin: Skin is warm and dry.  Psychiatric: She has a normal mood and affect. Her behavior is normal.  Nursing note and vitals reviewed.   Results for orders placed or performed in visit on 08/23/18  Hepatic Function Panel  Result Value Ref Range   Total Protein 6.7 6.0 - 8.5 g/dL   Albumin 4.4 3.6 - 4.8 g/dL   Bilirubin Total 0.3 0.0 - 1.2 mg/dL   Bilirubin, Direct 0.12 0.00 - 0.40 mg/dL   Alkaline Phosphatase 64 39 - 117 IU/L   AST 16 0 - 40 IU/L   ALT 8 0 - 32 IU/L      Assessment & Plan:   Problem List Items Addressed This Visit      Cardiovascular and Mediastinum   Essential hypertension    Chronic, stable without medication.  Continue to monitor.  BMP today.      Relevant Orders   CBC   TSH  Vitamin D (25 hydroxy)   Magnesium     Musculoskeletal and Integument   OA (osteoarthritis)    Chronic, ongoing.  Well-managed with Gabapentin.  Continue current medication regimen and monitor.      Onychomycosis    Chronic, stable.  No erythema or irritation noted to bilateral feet at this time.  Continue to monitor. Follow- up with podiatry as needed.        Other   Hyperlipidemia    Chronic, stable without medication.  Continue to monitor.  Continue diet control.      Relevant Orders   Basic Metabolic Panel (BMET)   Lipid Profile   Depression    Stable on current medication regimen, continue.       Other Visit Diagnoses    Annual physical exam    -  Primary   Relevant Orders   MM Digital Screening       Follow up plan: Return in about 6 months (around 03/04/2019), or if symptoms worsen or fail to improve, for HTN and HLD.

## 2018-09-02 NOTE — Assessment & Plan Note (Signed)
Chronic, stable without medication.  Continue to monitor.  BMP today.

## 2018-09-02 NOTE — Assessment & Plan Note (Signed)
Chronic, stable.  No erythema or irritation noted to bilateral feet at this time.  Continue to monitor. Follow- up with podiatry as needed.

## 2018-09-02 NOTE — Assessment & Plan Note (Signed)
Stable on current medication regimen, continue.

## 2018-09-02 NOTE — Patient Instructions (Signed)

## 2018-09-02 NOTE — Assessment & Plan Note (Signed)
Chronic, stable without medication.  Continue to monitor.  Continue diet control.

## 2018-09-03 LAB — VITAMIN D 25 HYDROXY (VIT D DEFICIENCY, FRACTURES): Vit D, 25-Hydroxy: 49.4 ng/mL (ref 30.0–100.0)

## 2018-09-03 LAB — BASIC METABOLIC PANEL
BUN/Creatinine Ratio: 14 (ref 12–28)
BUN: 13 mg/dL (ref 8–27)
CHLORIDE: 103 mmol/L (ref 96–106)
CO2: 24 mmol/L (ref 20–29)
Calcium: 9.5 mg/dL (ref 8.7–10.3)
Creatinine, Ser: 0.94 mg/dL (ref 0.57–1.00)
GFR calc Af Amer: 72 mL/min/{1.73_m2} (ref >59)
GFR calc non Af Amer: 62 mL/min/{1.73_m2} (ref >59)
GLUCOSE: 86 mg/dL (ref 65–99)
POTASSIUM: 4.4 mmol/L (ref 3.5–5.2)
SODIUM: 143 mmol/L (ref 134–144)

## 2018-09-03 LAB — CBC
HEMATOCRIT: 42.9 % (ref 34.0–46.6)
HEMOGLOBIN: 13.6 g/dL (ref 11.1–15.9)
MCH: 24.5 pg — ABNORMAL LOW (ref 26.6–33.0)
MCHC: 31.7 g/dL (ref 31.5–35.7)
MCV: 77 fL — ABNORMAL LOW (ref 79–97)
Platelets: 323 10*3/uL (ref 150–450)
RBC: 5.56 x10E6/uL — ABNORMAL HIGH (ref 3.77–5.28)
RDW: 16.1 % — ABNORMAL HIGH (ref 12.3–15.4)
WBC: 5.8 10*3/uL (ref 3.4–10.8)

## 2018-09-03 LAB — LIPID PANEL
CHOLESTEROL TOTAL: 250 mg/dL — AB (ref 100–199)
Chol/HDL Ratio: 5 ratio — ABNORMAL HIGH (ref 0.0–4.4)
HDL: 50 mg/dL (ref >39)
LDL Calculated: 175 mg/dL — ABNORMAL HIGH (ref 0–99)
Triglycerides: 126 mg/dL (ref 0–149)
VLDL Cholesterol Cal: 25 mg/dL (ref 5–40)

## 2018-09-03 LAB — MAGNESIUM: MAGNESIUM: 2.2 mg/dL (ref 1.6–2.3)

## 2018-09-03 LAB — TSH: TSH: 3.98 u[IU]/mL (ref 0.450–4.500)

## 2018-09-17 DIAGNOSIS — H5789 Other specified disorders of eye and adnexa: Secondary | ICD-10-CM | POA: Diagnosis not present

## 2018-10-10 ENCOUNTER — Encounter

## 2018-10-10 ENCOUNTER — Telehealth: Payer: Self-pay

## 2018-10-10 DIAGNOSIS — H26492 Other secondary cataract, left eye: Secondary | ICD-10-CM | POA: Diagnosis not present

## 2018-10-10 NOTE — Telephone Encounter (Signed)
Dr. Amalia Hailey, patient called requesting bako results.  Please advise if any other treatment options for patient.  Results looked negative for fungus.  Please advise

## 2018-10-15 NOTE — Telephone Encounter (Signed)
Patient notified of results and Dr. Amalia Hailey recommendations

## 2018-11-22 ENCOUNTER — Ambulatory Visit (INDEPENDENT_AMBULATORY_CARE_PROVIDER_SITE_OTHER): Payer: PPO | Admitting: Family Medicine

## 2018-11-22 ENCOUNTER — Encounter: Payer: Self-pay | Admitting: Family Medicine

## 2018-11-22 VITALS — BP 143/81 | HR 82 | Temp 98.1°F | Wt 171.0 lb

## 2018-11-22 DIAGNOSIS — J069 Acute upper respiratory infection, unspecified: Secondary | ICD-10-CM | POA: Diagnosis not present

## 2018-11-22 MED ORDER — HYDROCOD POLST-CPM POLST ER 10-8 MG/5ML PO SUER: 5.0000 mL | Freq: Every evening | ORAL | 0 refills | Status: DC | PRN

## 2018-11-22 MED ORDER — DOXYCYCLINE HYCLATE 100 MG PO TABS: 100.0000 mg | ORAL_TABLET | Freq: Two times a day (BID) | ORAL | 0 refills | Status: DC

## 2018-11-22 NOTE — Progress Notes (Signed)
BP (!) 143/81   Pulse 82   Temp 98.1 F (36.7 C) (Oral)   Wt 171 lb (77.6 kg)   SpO2 98%   BMI 30.29 kg/m    Subjective:    Patient ID: Angela Martinez, female    DOB: 1951-03-03, 68 y.o.   MRN: [id number]  HPI: Angela Martinez is a 68 y.o. female  Chief Complaint  Patient presents with  . URI    pt states she has had a cough, congestion (nasal ans chest) , sinus pressure, headache and yellow phlegm for about a week    5 days of productive cough, chest congestion, sinus pain and pressure, chills, fatigue, SOB with exertion. Taking coricidin, alka seltzer plus, mucinex DM, cough drops, cough syrups with no relief. Husband sick with similar sxs. Denies known fevers, CP, ear pain, N/V/D.   Relevant past medical, surgical, family and social history reviewed and updated as indicated. Interim medical history since our last visit reviewed. Allergies and medications reviewed and updated.  Review of Systems  Per HPI unless specifically indicated above     Objective:    BP (!) 143/81   Pulse 82   Temp 98.1 F (36.7 C) (Oral)   Wt 171 lb (77.6 kg)   SpO2 98%   BMI 30.29 kg/m   Wt Readings from Last 3 Encounters:  11/22/18 171 lb (77.6 kg)  09/02/18 167 lb 4.8 oz (75.9 kg)  08/02/18 168 lb (76.2 kg)    Physical Exam Vitals signs and nursing note reviewed.  Constitutional:      Appearance: Normal appearance.  HENT:     Head: Atraumatic.     Right Ear: Tympanic membrane and external ear normal.     Left Ear: Tympanic membrane and external ear normal.     Nose: Congestion present.     Mouth/Throat:     Mouth: Mucous membranes are moist.     Pharynx: Posterior oropharyngeal erythema present.  Eyes:     Extraocular Movements: Extraocular movements intact.     Conjunctiva/sclera: Conjunctivae normal.  Neck:     Musculoskeletal: Normal range of motion and neck supple.  Cardiovascular:     Rate and Rhythm: Normal rate and regular rhythm.     Heart sounds: Normal heart sounds.    Pulmonary:     Effort: Pulmonary effort is normal.     Breath sounds: Normal breath sounds. No wheezing.  Musculoskeletal: Normal range of motion.  Skin:    General: Skin is warm and dry.  Neurological:     Mental Status: She is alert and oriented to person, place, and time.  Psychiatric:        Mood and Affect: Mood normal.        Thought Content: Thought content normal.     Results for orders placed or performed in visit on 09/47/09  Basic Metabolic Panel (BMET)  Result Value Ref Range   Glucose 86 65 - 99 mg/dL   BUN 13 8 - 27 mg/dL   Creatinine, Ser 0.94 0.57 - 1.00 mg/dL   GFR calc non Af Amer 62 >59 mL/min/1.73   GFR calc Af Amer 72 >59 mL/min/1.73   BUN/Creatinine Ratio 14 12 - 28   Sodium 143 134 - 144 mmol/L   Potassium 4.4 3.5 - 5.2 mmol/L   Chloride 103 96 - 106 mmol/L   CO2 24 20 - 29 mmol/L   Calcium 9.5 8.7 - 10.3 mg/dL  Lipid Profile  Result Value Ref Range  Cholesterol, Total 250 (H) 100 - 199 mg/dL   Triglycerides 126 0 - 149 mg/dL   HDL 50 >39 mg/dL   VLDL Cholesterol Cal 25 5 - 40 mg/dL   LDL Calculated 175 (H) 0 - 99 mg/dL   Chol/HDL Ratio 5.0 (H) 0.0 - 4.4 ratio  TSH  Result Value Ref Range   TSH 3.980 0.450 - 4.500 uIU/mL  Vitamin D (25 hydroxy)  Result Value Ref Range   Vit D, 25-Hydroxy 49.4 30.0 - 100.0 ng/mL  Magnesium  Result Value Ref Range   Magnesium 2.2 1.6 - 2.3 mg/dL  CBC  Result Value Ref Range   WBC 5.8 3.4 - 10.8 x10E3/uL   RBC 5.56 (H) 3.77 - 5.28 x10E6/uL   Hemoglobin 13.6 11.1 - 15.9 g/dL   Hematocrit 42.9 34.0 - 46.6 %   MCV 77 (L) 79 - 97 fL   MCH 24.5 (L) 26.6 - 33.0 pg   MCHC 31.7 31.5 - 35.7 g/dL   RDW 16.1 (H) 12.3 - 15.4 %   Platelets 323 150 - 450 x10E3/uL      Assessment & Plan:   Problem List Items Addressed This Visit    None    Visit Diagnoses    Upper respiratory tract infection, unspecified type    -  Primary   Has issues with zpak, tx with doxy, mucinex, tussionex for bedtime. Continue supportive  care and OTC remedies. F/u if not improving       Follow up plan: Return for as scheduled.

## 2018-12-03 ENCOUNTER — Ambulatory Visit (INDEPENDENT_AMBULATORY_CARE_PROVIDER_SITE_OTHER): Payer: PPO | Admitting: Nurse Practitioner

## 2018-12-03 ENCOUNTER — Ambulatory Visit
Admission: RE | Admit: 2018-12-03 | Discharge: 2018-12-03 | Disposition: A | Discharge status: Home / Self Care | Payer: PPO | Source: Ambulatory Visit | Attending: Nurse Practitioner | Admitting: Nurse Practitioner

## 2018-12-03 ENCOUNTER — Encounter: Payer: Self-pay | Admitting: Nurse Practitioner

## 2018-12-03 VITALS — BP 136/73 | HR 71 | Temp 97.5°F | Ht 63.0 in | Wt 170.0 lb

## 2018-12-03 DIAGNOSIS — R05 Cough: Secondary | ICD-10-CM | POA: Diagnosis not present

## 2018-12-03 DIAGNOSIS — E7800 Pure hypercholesterolemia, unspecified: Secondary | ICD-10-CM

## 2018-12-03 DIAGNOSIS — J069 Acute upper respiratory infection, unspecified: Secondary | ICD-10-CM | POA: Insufficient documentation

## 2018-12-03 LAB — LIPID PANEL PICCOLO, WAIVED
Chol/HDL Ratio Piccolo,Waive: 4.9 mg/dL
Cholesterol Piccolo, Waived: 215 mg/dL — ABNORMAL HIGH (ref <200)
HDL CHOL PICCOLO, WAIVED: 44 mg/dL — AB (ref >59)
LDL Chol Calc Piccolo Waived: 120 mg/dL — ABNORMAL HIGH (ref <100)
Triglycerides Piccolo,Waived: 253 mg/dL — ABNORMAL HIGH (ref <150)
VLDL Chol Calc Piccolo,Waive: 51 mg/dL — ABNORMAL HIGH (ref <30)

## 2018-12-03 MED ORDER — PREDNISONE 10 MG PO TABS: 30.0000 mg | ORAL_TABLET | Freq: Every day | ORAL | 0 refills | Status: AC

## 2018-12-03 MED ORDER — ATORVASTATIN CALCIUM 20 MG PO TABS: 20.0000 mg | ORAL_TABLET | Freq: Every day | ORAL | 3 refills | Status: DC

## 2018-12-03 NOTE — Patient Instructions (Addendum)
Over time and in combination with inflammation and other factors, this contributes to plaque which in turn may lead to stroke and/or heart attack down the road.  Sometimes high LDL is primarily genetic, and people might be eating all the right foods but still have high numbers.  Other times, there is room for improvement in one's diet and eating healthier can bring this number down and potentially reduce one's risk of heart attack and/or stroke.    To reduce your LDL, Remember - more fruits and vegetables, more fish, and limitnred meat and dairy products.  More soy, nuts, beans, barley, lentils, oats and plant sterol ester enriched margarine instead of butter.  I also encourage eliminating sugar and processed food.  Remember, shop on the outside of the grocery store and visit your [employer].   If you would like to talk with me about dietary changes plus or minus medications for your cholesterol, please let me know. We should recheck your cholesterol in 3-6 months.      Cholesterol  Cholesterol is a fat. Your body needs a small amount of cholesterol. Cholesterol (plaque) may build up in your blood vessels (arteries). That makes you more likely to have a heart attack or stroke. You cannot feel your cholesterol level. Having a blood test is the only way to find out if your level is high. Keep your test results. Work with your doctor to keep your cholesterol at a good level. What do the results mean?  Total cholesterol is how much cholesterol is in your blood.  LDL is bad cholesterol. This is the type that can build up. Try to have low LDL.  HDL is good cholesterol. It cleans your blood vessels and carries LDL away. Try to have high HDL.  Triglycerides are fat that the body can store or burn for energy. What are good levels of cholesterol?  Total cholesterol below 200.  LDL below 100 is good for people who have health risks. LDL below 70 is good for people who have very high risks.  HDL  above 40 is good. It is best to have HDL of 60 or higher.  Triglycerides below 150. How can I lower my cholesterol? Diet Follow your diet program as told by your doctor.  Choose fish, white meat chicken, or [place] that is roasted or baked. Try not to eat red meat, fried foods, sausage, or lunch meats.  Eat lots of fresh fruits and vegetables.  Choose whole grains, beans, pasta, potatoes, and cereals.  Choose olive oil, corn oil, or canola oil. Only use small amounts.  Try not to eat butter, mayonnaise, shortening, or palm kernel oils.  Try not to eat foods with trans fats.  Choose low-fat or nonfat dairy foods. ? Drink skim or nonfat milk. ? Eat low-fat or nonfat yogurt and cheeses. ? Try not to drink whole milk or cream. ? Try not to eat ice cream, egg yolks, or full-fat cheeses.  Healthy desserts include angel food cake, ginger snaps, animal crackers, hard candy, popsicles, and low-fat or nonfat frozen yogurt. Try not to eat pastries, cakes, pies, and cookies.  Exercise Follow your exercise program as told by your doctor.  Be more active. Try gardening, walking, and taking the stairs.  Ask your doctor about ways that you can be more active. Medicine  Take over-the-counter and prescription medicines only as told by your doctor. This information is not intended to replace advice given to you by your health care provider. Make sure  you discuss any questions you have with your health care provider. Document Released: 02/02/2009 Document Revised: 06/07/2016 Document Reviewed: 05/18/2016 Elsevier Interactive Patient Education  2019 [employer].

## 2018-12-03 NOTE — Progress Notes (Signed)
BP 136/73 (BP Location: Left Arm, Patient Position: Sitting, Cuff Size: Normal)   Pulse 71   Temp (!) 97.5 F (36.4 C)   Ht 5\' 3"  (1.6 m)   Wt 170 lb (77.1 kg)   SpO2 96%   BMI 30.11 kg/m    Subjective:    Patient ID: Angela Martinez, female    DOB: [birth year], 68 y.o.   MRN: [id number]  HPI: LITZY Martinez is a 68 y.o. female  Chief Complaint  Patient presents with  . URI    Patient states that she finished the antibiotic and the cough syrup, but is still congested in her nose, throat head and chest   HYPERLIPIDEMIA No current medications, but based on recent LDL elevation noted. Hyperlipidemia status: no current medications, but is open to starting Satisfied with current treatment?  no current treatment Supplements: none Aspirin:  no The 10-year ASCVD risk score Angela Martinez DC Angela Martinez., et al., 2013) is: 10.5%   Values used to calculate the score:     Age: 45 years     Sex: Female     Is Non-Hispanic African American: No     Diabetic: No     Tobacco smoker: No     Systolic Blood Pressure: 409 mmHg     Is BP treated: No     HDL Cholesterol: 50 mg/dL     Total Cholesterol: 250 mg/dL Chest pain:  no Coronary artery disease:  no Family history CAD:  yes Family history early CAD:  no   UPPER RESPIRATORY TRACT INFECTION Treated on 11/22/2018 for URI with Doxy and Tussionex + supportive care at home.  Reports continued congestion nose, throat, chest, and head.  She has a h/o pneumonia several years ago and reports concern for pneumonia, although states "I don't think it is now because it is clear coming up, but I think it was pneumonia". Worst symptom: Congestion Fever: no Cough: yes Shortness of breath: no Wheezing: yes Chest pain: no Chest tightness: yes Chest congestion: yes Nasal congestion: yes Runny nose: no Post nasal drip: no Sneezing: no Sore throat: no Swollen glands: no Sinus pressure: yes Headache: no Face pain: no Toothache: no Ear pain: none Ear pressure: yes  bilateral with tinnitus Eyes red/itching:no Eye drainage/crusting: no  Vomiting: no Rash: no Fatigue: yes Sick contacts: yes Strep contacts: no  Context: fluctuating Recurrent sinusitis: no Relief with OTC cold/cough medications: sometimes  Treatments attempted: cold/sinus, mucinex, cough syrup and antibiotics   Relevant past medical, surgical, family and social history reviewed and updated as indicated. Interim medical history since our last visit reviewed. Allergies and medications reviewed and updated.  Review of Systems  Constitutional: Positive for fatigue. Negative for activity change, appetite change, diaphoresis and fever.  HENT: Positive for congestion and sinus pressure. Negative for ear discharge, ear pain, facial swelling, postnasal drip, rhinorrhea, sinus pain, sneezing, sore throat and voice change.   Eyes: Negative for pain and visual disturbance.  Respiratory: Positive for cough and chest tightness. Negative for shortness of breath and wheezing.   Cardiovascular: Negative for chest pain, palpitations and leg swelling.  Gastrointestinal: Negative for abdominal distention, abdominal pain, constipation, diarrhea, nausea and vomiting.  Endocrine: Negative.   Musculoskeletal: Negative for myalgias.  Neurological: Negative for dizziness, numbness and headaches.  Psychiatric/Behavioral: Negative.     Per HPI unless specifically indicated above     Objective:    BP 136/73 (BP Location: Left Arm, Patient Position: Sitting, Cuff Size: Normal)   Pulse 71  Temp (!) 97.5 F (36.4 C)   Ht 5\' 3"  (1.6 m)   Wt 170 lb (77.1 kg)   SpO2 96%   BMI 30.11 kg/m   Wt Readings from Last 3 Encounters:  12/03/18 170 lb (77.1 kg)  11/22/18 171 lb (77.6 kg)  09/02/18 167 lb 4.8 oz (75.9 kg)    Physical Exam Vitals signs and nursing note reviewed.  Constitutional:      General: She is awake.     Appearance: She is well-developed.  HENT:     Head: Normocephalic. No raccoon  eyes.     Right Ear: Hearing, ear canal and external ear normal. A middle ear effusion is present.     Left Ear: Hearing, ear canal and external ear normal. A middle ear effusion is present.     Nose: Mucosal edema and rhinorrhea present. Rhinorrhea is clear.     Right Sinus: No maxillary sinus tenderness or frontal sinus tenderness.     Left Sinus: No maxillary sinus tenderness or frontal sinus tenderness.     Mouth/Throat:     Mouth: Mucous membranes are moist.     Pharynx: Oropharynx is clear. No pharyngeal swelling, oropharyngeal exudate or posterior oropharyngeal erythema.     Tonsils: Swelling: 0 on the right. 0 on the left.  Eyes:     General: Lids are normal.        Right eye: No discharge.        Left eye: No discharge.     Conjunctiva/sclera: Conjunctivae normal.     Pupils: Pupils are equal, round, and reactive to light.  Neck:     Musculoskeletal: Normal range of motion and neck supple.     Thyroid: No thyromegaly.     Vascular: No carotid bruit or JVD.  Cardiovascular:     Rate and Rhythm: Normal rate and regular rhythm.     Heart sounds: Normal heart sounds. No murmur. No gallop.   Pulmonary:     Effort: Pulmonary effort is normal.     Breath sounds: Examination of the right-upper field reveals wheezing. Examination of the left-upper field reveals wheezing. Wheezing present.     Comments: Intermittent expiratory wheezes upper lobes with clear lower lobes and RML.  No rhonchi.   Abdominal:     General: Bowel sounds are normal.     Palpations: Abdomen is soft. There is no hepatomegaly or splenomegaly.  Musculoskeletal:     Right lower leg: No edema.     Left lower leg: No edema.  Lymphadenopathy:     Head:     Right side of head: No submental, submandibular or tonsillar adenopathy.     Left side of head: No submental, submandibular or tonsillar adenopathy.     Cervical: No cervical adenopathy.  Skin:    General: Skin is warm and dry.  Neurological:     Mental  Status: She is alert and oriented to person, place, and time.  Psychiatric:        Attention and Perception: Attention normal.        Mood and Affect: Mood normal.        Behavior: Behavior normal. Behavior is cooperative.        Thought Content: Thought content normal.        Judgment: Judgment normal.     Results for orders placed or performed in visit on 93/57/01  Basic Metabolic Panel (BMET)  Result Value Ref Range   Glucose 86 65 - 99 mg/dL  BUN 13 8 - 27 mg/dL   Creatinine, Ser 0.94 0.57 - 1.00 mg/dL   GFR calc non Af Amer 62 >59 mL/min/1.73   GFR calc Af Amer 72 >59 mL/min/1.73   BUN/Creatinine Ratio 14 12 - 28   Sodium 143 134 - 144 mmol/L   Potassium 4.4 3.5 - 5.2 mmol/L   Chloride 103 96 - 106 mmol/L   CO2 24 20 - 29 mmol/L   Calcium 9.5 8.7 - 10.3 mg/dL  Lipid Profile  Result Value Ref Range   Cholesterol, Total 250 (H) 100 - 199 mg/dL   Triglycerides 126 0 - 149 mg/dL   HDL 50 >39 mg/dL   VLDL Cholesterol Cal 25 5 - 40 mg/dL   LDL Calculated 175 (H) 0 - 99 mg/dL   Chol/HDL Ratio 5.0 (H) 0.0 - 4.4 ratio  TSH  Result Value Ref Range   TSH 3.980 0.450 - 4.500 uIU/mL  Vitamin D (25 hydroxy)  Result Value Ref Range   Vit D, 25-Hydroxy 49.4 30.0 - 100.0 ng/mL  Magnesium  Result Value Ref Range   Magnesium 2.2 1.6 - 2.3 mg/dL  CBC  Result Value Ref Range   WBC 5.8 3.4 - 10.8 x10E3/uL   RBC 5.56 (H) 3.77 - 5.28 x10E6/uL   Hemoglobin 13.6 11.1 - 15.9 g/dL   Hematocrit 42.9 34.0 - 46.6 %   MCV 77 (L) 79 - 97 fL   MCH 24.5 (L) 26.6 - 33.0 pg   MCHC 31.7 31.5 - 35.7 g/dL   RDW 16.1 (H) 12.3 - 15.4 %   Platelets 323 150 - 450 x10E3/uL      Assessment & Plan:   Problem List Items Addressed This Visit      Other   Hyperlipidemia - Primary    Chronic, ongoing.  LDL 120 and TCHOL 215 (slightly improved today, although risk score continues to be elevated and has family history).  Start Atorvastatin 20 MG QHS.  Discussed at length risks/benefits and side effects.   Provided information on diet changes.  Recheck in 3 months.      Relevant Medications   atorvastatin (LIPITOR) 20 MG tablet   Other Relevant Orders   Lipid Panel Piccolo, Waived    Other Visit Diagnoses    Viral upper respiratory tract infection       Recent treatment with abx and Tussionex.  Will obtain CXR and script sent for Prednisone.  Return for worsening symptoms.   Relevant Orders   DG Chest 2 View       Follow up plan: Return in about 3 months (around 03/04/2019) for depression, HTN/ HLD.

## 2018-12-03 NOTE — Assessment & Plan Note (Signed)
Chronic, ongoing.  LDL 120 and TCHOL 215 (slightly improved today, although risk score continues to be elevated and has family history).  Start Atorvastatin 20 MG QHS.  Discussed at length risks/benefits and side effects.  Provided information on diet changes.  Recheck in 3 months.

## 2018-12-13 DIAGNOSIS — H26492 Other secondary cataract, left eye: Secondary | ICD-10-CM | POA: Diagnosis not present

## 2018-12-19 ENCOUNTER — Telehealth: Payer: Self-pay | Admitting: Nurse Practitioner

## 2018-12-19 NOTE — Telephone Encounter (Signed)
I did attempt to call her back.  I looked at the films last time, I always look at the report and films.  I did not see anything acute and nothing acute was reported on final report.  No pneumonia.  If ongoing symptoms she can return.  I would keep taking Mucinex at home and I can send in Albuterol inhaler if that may help.  Thanks.

## 2018-12-19 NOTE — Telephone Encounter (Signed)
Pt presented in office stating that she is not better after being prescribed medication. She states that it feels like something is still in her left lung. Wants to know if you can look at the X-Boerema and see what is in there. Please advise.

## 2018-12-19 NOTE — Telephone Encounter (Signed)
Called pt and advised of information from provider. States that she will continue on mucinex and if she's not feeling better in a couple of days she will see about scheduling another appointment.

## 2019-01-30 ENCOUNTER — Other Ambulatory Visit: Payer: Self-pay | Admitting: Family Medicine

## 2019-01-30 DIAGNOSIS — K219 Gastro-esophageal reflux disease without esophagitis: Secondary | ICD-10-CM

## 2019-01-30 NOTE — Telephone Encounter (Signed)
[facility] called and spoke to [place], Education administrator about the refill request. I advised it was sent on 08/02/18 #90/4 refills and to verify it was received. She says it's on file that the patient called in a refill on an old prescription, so she will get it ready for the patient to pick up.

## 2019-02-25 ENCOUNTER — Ambulatory Visit: Payer: PPO | Admitting: Nurse Practitioner

## 2019-03-04 ENCOUNTER — Ambulatory Visit: Payer: Self-pay | Admitting: *Deleted

## 2019-03-04 NOTE — Telephone Encounter (Signed)
Could we move her to in person next Wednesday afternoon so we can do in person visit.  That way we can do pulmonary testing.  She may have some ongoing reactive airway.  We can not do COVID testing, at this point I suspect more ongoing reactive airway.  We can assess this in office and prescribe inhaler if needed.

## 2019-03-04 NOTE — Telephone Encounter (Signed)
Pt called wanting to have the test for the coronavirus done at her appointment this week. She has an office visit scheduled. I advised the patient that the visits are now virtual and that she would need to speak with a scheduler regarding this.  She stated her husband was sick in December and then 3 or 4 days after that she became sick with the same thing.  She had a fever then but now now.  She has a cough that has been going on since then.  She feels like there is something in her lungs that she can not get up. She will beat on the side of her chest, hoping to break up so she can cough it up. She has been taking mucinex, drinking fluids and hot drinks to see if that helps.   Saw another provider in the office, was treated for pneumonia Had cxr done. Feels like machine was not working per the person that did the xray.  feels lungs are scarred from having pneumonia twice as a child. Now she wants to know if the Covid-19 test would be available to take. Advised her that these tests are done at the hospital and not at the office. Pt voiced understanding. Notified flow at [facility] for review and recommendation of a  Virtual appointment. Routing to [facility]. Advised patient that she will be getting a call back regarding her appointment.   Reason for Disposition . Cough has been present for > 3 weeks  Answer Assessment - Initial Assessment Questions 1. ONSET: "When did the cough begin?"      December 2. SEVERITY: "How bad is the cough today?"      Non productive 3. RESPIRATORY DISTRESS: "Describe your breathing."      normal 4. FEVER: "Do you have a fever?" If so, ask: "What is your temperature, how was it measured, and when did it start?"     No fever now but did in Dec 5. HEMOPTYSIS: "Are you coughing up any blood?" If so ask: "How much?" (flecks, streaks, tablespoons, etc.)     no 6. TREATMENT: "What have you done so far to treat the cough?" (e.g., meds, fluids, humidifier)     Meds, mucinex 7.  CARDIAC HISTORY: "Do you have any history of heart disease?" (e.g., heart attack, congestive heart failure)      no 8. LUNG HISTORY: "Do you have any history of lung disease?"  (e.g., pulmonary embolus, asthma, emphysema)     no 9. PE RISK FACTORS: "Do you have a history of blood clots?" (or: recent major surgery, recent prolonged travel, bedridden)     Yes in her leg, years ago 10. OTHER SYMPTOMS: "Do you have any other symptoms? (e.g., runny nose, wheezing, chest pain)       no 11. PREGNANCY: "Is there any chance you are pregnant?" "When was your last menstrual period?"       n/a 12. TRAVEL: "Have you traveled out of the country in the last month?" (e.g., travel history, exposures)       no  Protocols used: COUGH - ACUTE NON-PRODUCTIVE-A-AH

## 2019-03-04 NOTE — Telephone Encounter (Signed)
Appt scheduled for tomorrow morning 

## 2019-03-05 ENCOUNTER — Other Ambulatory Visit: Payer: Self-pay

## 2019-03-05 ENCOUNTER — Ambulatory Visit (INDEPENDENT_AMBULATORY_CARE_PROVIDER_SITE_OTHER): Payer: PPO | Admitting: Nurse Practitioner

## 2019-03-05 ENCOUNTER — Encounter: Payer: Self-pay | Admitting: Nurse Practitioner

## 2019-03-05 VITALS — BP 133/83 | HR 70 | Temp 97.6°F

## 2019-03-05 DIAGNOSIS — R05 Cough: Secondary | ICD-10-CM | POA: Diagnosis not present

## 2019-03-05 DIAGNOSIS — R059 Cough, unspecified: Secondary | ICD-10-CM

## 2019-03-05 MED ORDER — DOXYCYCLINE HYCLATE 100 MG PO TABS: 100.0000 mg | ORAL_TABLET | Freq: Two times a day (BID) | ORAL | 0 refills | Status: DC

## 2019-03-05 MED ORDER — PREDNISONE 20 MG PO TABS: 40.0000 mg | ORAL_TABLET | Freq: Every day | ORAL | 0 refills | Status: AC

## 2019-03-05 NOTE — Assessment & Plan Note (Addendum)
Ongoing with some improvement reported.  Will repeat CXR.  Script for Doxycycline and Prednisone sent.  Consider addition of Augmentin (which pt reports less issues with) if PNA noted on CXR.  Spirometry within normal limits.  Recommend continued use of Mucinex at home and daily allergy medications.  Return in 2 weeks for lung check.

## 2019-03-05 NOTE — Progress Notes (Signed)
BP 133/83   Pulse 70   Temp 97.6 F (36.4 C) (Oral)   SpO2 98%    Subjective:    Patient ID: Angela Martinez, female    DOB: 1951-04-05, 68 y.o.   MRN: [id number]  HPI: Angela Martinez is a 68 y.o. female  Chief Complaint  Patient presents with  . Lung Screening    pt states she feels like there is something in her left lower lobe   COUGH Was treated 11/22/2018 with Doxycycline for URI.  CXR 12/03/2018 was negative for PNA or acute changes.  Reports ongoing cough since URI.  Wishes to have COVID testing.  She denies fever, SOB, diaphoresis, GI issues, or loss of taste.  No recent exposure to Covid + person or recent travel overseas.  She thinks she "had COVID" in January. Discussed that there is no COVID testing in office at this time, that testing is performed for inpatient patients or those that are high risk.  Discussed that antibody testing is not available as of yet.  Reports the cough is "way way better" than it has been, but continues to bother her.  Reports it feels better when she does deep breathing exercises and pats on left lower back.  States she feels the cough more down in her left lower lung.  Has Flonase, but does not consistently take. Takes Claritin every day.  Reports she has a "very sensitive stomach" and the Doxycycline did not bother her.  Has taken Azithromycin and Augmentin in past, that bothered her. Duration: months Circumstances of initial development of cough: URI Cough severity: mild Cough description: productive, clear Aggravating factors:  worse at night and in morning Alleviating factors: mucinex Status:  better Treatments attempted: mucinex Wheezing: no Shortness of breath: no Chest pain: no Chest tightness:no Nasal congestion: no Runny nose: yes Postnasal drip: yes Frequent throat clearing or swallowing: yes Hemoptysis: no Fevers: no Night sweats: no Weight loss: no Heartburn: no change from her normal, takes Omeprazole Recent foreign travel: no  Tuberculosis contacts: no  Relevant past medical, surgical, family and social history reviewed and updated as indicated. Interim medical history since our last visit reviewed. Allergies and medications reviewed and updated.  Review of Systems  Constitutional: Negative for activity change, appetite change, fatigue and fever.  HENT: Positive for postnasal drip and rhinorrhea. Negative for congestion, ear discharge, ear pain, facial swelling, sinus pressure, sneezing, sore throat and voice change.   Eyes: Negative for pain and visual disturbance.  Respiratory: Positive for cough. Negative for chest tightness, shortness of breath and wheezing.   Cardiovascular: Negative for chest pain, palpitations and leg swelling.  Gastrointestinal: Negative for abdominal distention, abdominal pain, constipation, diarrhea, nausea and vomiting.  Endocrine: Negative.   Musculoskeletal: Negative for myalgias.  Neurological: Negative for dizziness, numbness and headaches.  Psychiatric/Behavioral: Negative.     Per HPI unless specifically indicated above     Objective:    BP 133/83   Pulse 70   Temp 97.6 F (36.4 C) (Oral)   SpO2 98%   Wt Readings from Last 3 Encounters:  12/03/18 170 lb (77.1 kg)  11/22/18 171 lb (77.6 kg)  09/02/18 167 lb 4.8 oz (75.9 kg)    Physical Exam Vitals signs and nursing note reviewed.  Constitutional:      General: She is awake. She is not in acute distress.    Appearance: She is well-developed. She is obese. She is not ill-appearing.  HENT:     Head: Normocephalic.  Right Ear: Hearing, tympanic membrane, ear canal and external ear normal.     Left Ear: Hearing, tympanic membrane, ear canal and external ear normal.     Nose: Nose normal.     Mouth/Throat:     Mouth: Mucous membranes are moist.     Pharynx: Oropharynx is clear. Uvula midline. No posterior oropharyngeal erythema.  Eyes:     General:        Right eye: No discharge.        Left eye: No discharge.      Conjunctiva/sclera: Conjunctivae normal.     Pupils: Pupils are equal, round, and reactive to light.  Neck:     Musculoskeletal: Normal range of motion and neck supple.     Thyroid: No thyromegaly.     Vascular: No carotid bruit or JVD.  Cardiovascular:     Rate and Rhythm: Normal rate and regular rhythm.     Heart sounds: Normal heart sounds. No murmur. No gallop.   Pulmonary:     Effort: Pulmonary effort is normal. No accessory muscle usage or respiratory distress.     Breath sounds: Examination of the right-upper field reveals wheezing. Examination of the left-upper field reveals wheezing. Examination of the left-lower field reveals rales. Wheezing and rales present.     Comments: Fine rales LLL with intermittent expiratory wheezes bilateral upper lobes.   Abdominal:     General: Bowel sounds are normal.     Palpations: Abdomen is soft.  Musculoskeletal:     Right lower leg: No edema.     Left lower leg: No edema.  Lymphadenopathy:     Cervical: No cervical adenopathy.  Skin:    General: Skin is warm and dry.  Neurological:     Mental Status: She is alert and oriented to person, place, and time.  Psychiatric:        Mood and Affect: Mood normal.        Behavior: Behavior normal. Behavior is cooperative.        Thought Content: Thought content normal.        Judgment: Judgment normal.     Results for orders placed or performed in visit on 12/03/18  Lipid Panel Piccolo, [employer]  Result Value Ref Range   Cholesterol Piccolo, Waived 215 (H) <200 mg/dL   HDL Chol Piccolo, Waived 44 (L) >59 mg/dL   Triglycerides Piccolo,Waived 253 (H) <150 mg/dL   Chol/HDL Ratio Piccolo,Waive 4.9 mg/dL   LDL Chol Calc Piccolo Waived 120 (H) <100 mg/dL   VLDL Chol Calc Piccolo,Waive 51 (H) <30 mg/dL      Assessment & Plan:   Problem List Items Addressed This Visit      Other   Cough - Primary    Ongoing with some improvement reported.  Will repeat CXR.  Script for Doxycycline and  Prednisone sent.  Consider addition of Augmentin (which pt reports less issues with) if PNA noted on CXR.  Spirometry within normal limits.  Recommend continued use of Mucinex at home and daily allergy medications.  Return in 2 weeks for lung check.        Relevant Orders   Spirometry with Graph (Completed)   DG Chest 2 View       Follow up plan: Return in about 2 weeks (around 03/19/2019) for cough follow-up.

## 2019-03-05 NOTE — Patient Instructions (Signed)

## 2019-03-06 ENCOUNTER — Other Ambulatory Visit: Payer: Self-pay

## 2019-03-06 ENCOUNTER — Ambulatory Visit
Admission: RE | Admit: 2019-03-06 | Discharge: 2019-03-06 | Disposition: A | Discharge status: Home / Self Care | Payer: PPO | Attending: Nurse Practitioner | Admitting: Nurse Practitioner

## 2019-03-06 ENCOUNTER — Ambulatory Visit: Payer: PPO | Admitting: Nurse Practitioner

## 2019-03-06 ENCOUNTER — Ambulatory Visit
Admission: RE | Admit: 2019-03-06 | Discharge: 2019-03-06 | Disposition: A | Discharge status: Home / Self Care | Payer: PPO | Source: Ambulatory Visit | Attending: Nurse Practitioner | Admitting: Nurse Practitioner

## 2019-03-06 DIAGNOSIS — R0602 Shortness of breath: Secondary | ICD-10-CM | POA: Diagnosis not present

## 2019-03-06 DIAGNOSIS — R059 Cough, unspecified: Secondary | ICD-10-CM

## 2019-03-06 DIAGNOSIS — R05 Cough: Secondary | ICD-10-CM

## 2019-03-19 ENCOUNTER — Ambulatory Visit: Payer: PPO | Admitting: Nurse Practitioner

## 2019-03-27 ENCOUNTER — Encounter: Payer: Self-pay | Admitting: Nurse Practitioner

## 2019-03-27 ENCOUNTER — Other Ambulatory Visit: Payer: Self-pay

## 2019-03-27 ENCOUNTER — Ambulatory Visit (INDEPENDENT_AMBULATORY_CARE_PROVIDER_SITE_OTHER): Payer: PPO | Admitting: Nurse Practitioner

## 2019-03-27 DIAGNOSIS — R05 Cough: Secondary | ICD-10-CM

## 2019-03-27 DIAGNOSIS — R059 Cough, unspecified: Secondary | ICD-10-CM

## 2019-03-27 NOTE — Progress Notes (Signed)
BP 125/81   Pulse 84    Subjective:    Patient ID: Angela Martinez, female    DOB: Mar 15, 1951, 69 y.o.   MRN: [id number]  HPI: Angela Martinez is a 68 y.o. female  Chief Complaint  Patient presents with  . Cough    2 week f/up    . This visit was completed via telephone due to the restrictions of the COVID-19 pandemic. All issues as above were discussed and addressed but no physical exam was performed. If it was felt that the patient should be evaluated in the office, they were directed there. The patient verbally consented to this visit. Patient was unable to complete an audio/visual visit due to Lack of equipment. Due to the catastrophic nature of the COVID-19 pandemic, this visit was done through audio contact only. . Location of the patient: home . Location of the provider: home . Those involved with this call:  . Provider: Marnee Guarneri, DNP . CMA: Yvonna Alanis, CMA . Front Desk/Registration: Jill Side  . Time spent on call: 15 minutes on the phone discussing health concerns. 10 minutes total spent in review of patient's record and preparation of their chart. I verified patient identity using two factors (patient name and date of birth). Patient consents verbally to being seen via telemedicine visit today.   COUGH Seen on 03/05/2019 for ongoing issues with cough.  Spirometry WNL and CXR with no PNA.  She was started on Doxycycline and Prednisone for bronchitis.  She reports it is improving, only notices it when she lies on her left side on occasion.  States when present it is a dry cough.   Duration: weeks Circumstances of initial development of cough: URI Cough severity: mild Cough description: non-productive Wheezing: no Shortness of breath: no Chest pain: no Chest tightness:no Nasal congestion: no Runny nose: no Postnasal drip: no Frequent throat clearing or swallowing: no Hemoptysis: no Fevers: no Night sweats: no Weight loss: no Heartburn: no Recent foreign  travel: no Tuberculosis contacts: no  Relevant past medical, surgical, family and social history reviewed and updated as indicated. Interim medical history since our last visit reviewed. Allergies and medications reviewed and updated.  Review of Systems  Constitutional: Negative for activity change, appetite change, diaphoresis, fatigue and fever.  Respiratory: Positive for cough (is improving, only occasional). Negative for chest tightness and shortness of breath.   Cardiovascular: Negative for chest pain, palpitations and leg swelling.  Gastrointestinal: Negative for abdominal distention, abdominal pain, constipation, diarrhea, nausea and vomiting.  Endocrine: Negative for cold intolerance, heat intolerance, polydipsia, polyphagia and polyuria.  Neurological: Negative for dizziness, syncope, weakness, light-headedness, numbness and headaches.  Psychiatric/Behavioral: Negative.     Per HPI unless specifically indicated above     Objective:    BP 125/81   Pulse 84   Wt Readings from Last 3 Encounters:  12/03/18 170 lb (77.1 kg)  11/22/18 171 lb (77.6 kg)  09/02/18 167 lb 4.8 oz (75.9 kg)    Physical Exam   Unable to obtain due to telephone visit only.  No cough during entire conversation on phone with provider.  Results for orders placed or performed in visit on 12/03/18  Lipid Panel Piccolo, [employer]  Result Value Ref Range   Cholesterol Piccolo, Waived 215 (H) <200 mg/dL   HDL Chol Piccolo, Waived 44 (L) >59 mg/dL   Triglycerides Piccolo,Waived 253 (H) <150 mg/dL   Chol/HDL Ratio Piccolo,Waive 4.9 mg/dL   LDL Chol Calc Piccolo Waived 120 (H) <100  mg/dL   VLDL Chol Calc Piccolo,Waive 51 (H) <30 mg/dL      Assessment & Plan:   Problem List Items Addressed This Visit      Other   Cough    Reports this is improving.  Follow-up in 7 week for cough and chronic illness.         I discussed the assessment and treatment plan with the patient. The patient was provided an  opportunity to ask questions and all were answered. The patient agreed with the plan and demonstrated an understanding of the instructions.   The patient was advised to call back or seek an in-person evaluation if the symptoms worsen or if the condition fails to improve as anticipated.   I provided 15 minutes of time during this encounter.  Follow up plan: Return in about 7 weeks (around 05/15/2019) for cough and HTN.

## 2019-03-27 NOTE — Patient Instructions (Signed)

## 2019-03-27 NOTE — Assessment & Plan Note (Signed)
Reports this is improving.  Follow-up in 7 week for cough and chronic illness.

## 2019-05-05 ENCOUNTER — Telehealth: Payer: Self-pay | Admitting: Family Medicine

## 2019-05-05 NOTE — Telephone Encounter (Signed)
Called pt to go over script screening no answer left vm

## 2019-05-06 ENCOUNTER — Encounter: Payer: Self-pay | Admitting: Nurse Practitioner

## 2019-05-06 ENCOUNTER — Ambulatory Visit (INDEPENDENT_AMBULATORY_CARE_PROVIDER_SITE_OTHER): Payer: PPO | Admitting: Nurse Practitioner

## 2019-05-06 ENCOUNTER — Other Ambulatory Visit: Payer: Self-pay

## 2019-05-06 VITALS — BP 132/83 | HR 72 | Temp 98.0°F

## 2019-05-06 DIAGNOSIS — R05 Cough: Secondary | ICD-10-CM

## 2019-05-06 DIAGNOSIS — I10 Essential (primary) hypertension: Secondary | ICD-10-CM

## 2019-05-06 DIAGNOSIS — E7800 Pure hypercholesterolemia, unspecified: Secondary | ICD-10-CM

## 2019-05-06 DIAGNOSIS — R059 Cough, unspecified: Secondary | ICD-10-CM

## 2019-05-06 MED ORDER — ROSUVASTATIN CALCIUM 10 MG PO TABS: ORAL_TABLET | ORAL | 2 refills | Status: DC

## 2019-05-06 NOTE — Assessment & Plan Note (Signed)
Improved at this time.  Will continue to monitor. Return if worsening.

## 2019-05-06 NOTE — Progress Notes (Signed)
BP 132/83 (BP Location: Left Arm, Cuff Size: Normal)   Pulse 72   Temp 98 F (36.7 C) (Oral)   SpO2 99%    Subjective:    Patient ID: Angela Martinez, female    DOB: May 26, 1951, 68 y.o.   MRN: [id number]  HPI: Angela Martinez is a 68 y.o. female  Chief Complaint  Patient presents with  . Cough  . Hypertension   HYPERTENSION / HYPERLIPIDEMIA Continues on Atorvastatin and no medication for BP.  At last visit complained of muscles pains with Lipitor, has tried taking Q3DAYS and continues to have muscle pains.   Satisfied with current treatment? no Duration of hypertension: chronic BP monitoring frequency: rarely BP range: 120-130/80's BP medication side effects: no Duration of hyperlipidemia: chronic Cholesterol medication side effects: no Cholesterol supplements: none Medication compliance: good compliance Aspirin: no Recent stressors: no Recurrent headaches: no Visual changes: no Palpitations: no Dyspnea: no Chest pain: no Lower extremity edema: no Dizzy/lightheaded: no   The 10-year ASCVD risk score Mikey Bussing DC Jr., et al., 2013) is: 10.7%   Values used to calculate the score:     Age: 11 years     Sex: Female     Is Non-Hispanic African American: No     Diabetic: No     Tobacco smoker: No     Systolic Blood Pressure: 798 mmHg     Is BP treated: No     HDL Cholesterol: 44 mg/dL     Total Cholesterol: 215 mg/dL  COUGH Was ongoing with imaging noting no PNA and spirometry WNL.  Continues on Omeprazole for GERD.  Reports that cough has improved and only has intermittent episodes at night of coughing, states cooler weather makers her feel better. Status:  better Treatments attempted: cough syrup, antibiotics and albuterol Wheezing: no Shortness of breath: no Chest pain: no Chest tightness:no Nasal congestion: no Runny nose: no Postnasal drip: no Frequent throat clearing or swallowing: no Hemoptysis: no Fevers: no Night sweats: no Weight loss: no Heartburn: no  Recent foreign travel: no Tuberculosis contacts: no  Relevant past medical, surgical, family and social history reviewed and updated as indicated. Interim medical history since our last visit reviewed. Allergies and medications reviewed and updated.  Review of Systems  Constitutional: Negative for activity change, appetite change, diaphoresis, fatigue and fever.  Respiratory: Negative for cough, chest tightness and shortness of breath.   Cardiovascular: Negative for chest pain, palpitations and leg swelling.  Gastrointestinal: Negative for abdominal distention, abdominal pain, constipation, diarrhea, nausea and vomiting.  Neurological: Negative for dizziness, syncope, weakness, light-headedness, numbness and headaches.  Psychiatric/Behavioral: Negative.     Per HPI unless specifically indicated above     Objective:    BP 132/83 (BP Location: Left Arm, Cuff Size: Normal)   Pulse 72   Temp 98 F (36.7 C) (Oral)   SpO2 99%   Wt Readings from Last 3 Encounters:  12/03/18 170 lb (77.1 kg)  11/22/18 171 lb (77.6 kg)  09/02/18 167 lb 4.8 oz (75.9 kg)    Physical Exam Vitals signs and nursing note reviewed.  Constitutional:      General: She is awake.     Appearance: She is well-developed.  HENT:     Head: Normocephalic.     Right Ear: Hearing normal.     Left Ear: Hearing normal.     Nose: Nose normal.     Mouth/Throat:     Mouth: Mucous membranes are moist.  Eyes:  General: Lids are normal.        Right eye: No discharge.        Left eye: No discharge.     Conjunctiva/sclera: Conjunctivae normal.     Pupils: Pupils are equal, round, and reactive to light.  Neck:     Musculoskeletal: Normal range of motion and neck supple.     Thyroid: No thyromegaly.     Vascular: No carotid bruit or JVD.  Cardiovascular:     Rate and Rhythm: Normal rate and regular rhythm.     Heart sounds: Normal heart sounds. No murmur. No gallop.   Pulmonary:     Effort: Pulmonary effort is  normal.     Breath sounds: Normal breath sounds.  Abdominal:     General: Bowel sounds are normal.     Palpations: Abdomen is soft. There is no hepatomegaly or splenomegaly.  Musculoskeletal:     Right lower leg: No edema.     Left lower leg: No edema.  Lymphadenopathy:     Cervical: No cervical adenopathy.  Skin:    General: Skin is warm and dry.  Neurological:     Mental Status: She is alert and oriented to person, place, and time.  Psychiatric:        Attention and Perception: Attention normal.        Mood and Affect: Mood normal.        Behavior: Behavior normal. Behavior is cooperative.        Thought Content: Thought content normal.        Judgment: Judgment normal.     Results for orders placed or performed in visit on 12/03/18  Lipid Panel Piccolo, [employer]  Result Value Ref Range   Cholesterol Piccolo, Waived 215 (H) <200 mg/dL   HDL Chol Piccolo, Waived 44 (L) >59 mg/dL   Triglycerides Piccolo,Waived 253 (H) <150 mg/dL   Chol/HDL Ratio Piccolo,Waive 4.9 mg/dL   LDL Chol Calc Piccolo Waived 120 (H) <100 mg/dL   VLDL Chol Calc Piccolo,Waive 51 (H) <30 mg/dL      Assessment & Plan:   Problem List Items Addressed This Visit      Cardiovascular and Mediastinum   Essential hypertension - Primary    Chronic, stable with no medications.  Continue current diet control and adjust plan as needed based on readings      Relevant Medications   rosuvastatin (CRESTOR) 10 MG tablet   Other Relevant Orders   Comprehensive metabolic panel     Other   Hyperlipidemia    Chronic, ongoing with poor tolerance to Lipitor even at three days a week.  Will change to Crestor and try three day a week scheduled.  Lipid panel and CMP today.  Continue current medication regimen.  Return in 6 weeks.      Relevant Medications   rosuvastatin (CRESTOR) 10 MG tablet   Other Relevant Orders   Lipid Panel w/o Chol/HDL Ratio   Cough    Improved at this time.  Will continue to monitor. Return  if worsening.          Follow up plan: Return in about 6 weeks (around 06/17/2019) for HLD.

## 2019-05-06 NOTE — Assessment & Plan Note (Signed)
Chronic, ongoing with poor tolerance to Lipitor even at three days a week.  Will change to Crestor and try three day a week scheduled.  Lipid panel and CMP today.  Continue current medication regimen.  Return in 6 weeks.

## 2019-05-06 NOTE — Assessment & Plan Note (Signed)
Chronic, stable with no medications.  Continue current diet control and adjust plan as needed based on readings

## 2019-05-06 NOTE — Patient Instructions (Signed)
Rosuvastatin Tablets  What is this medicine?  ROSUVASTATIN (roe SOO va sta tin) is known as a HMG-CoA reductase inhibitor or 'statin'. It lowers cholesterol and triglycerides in the blood. This drug may also reduce the risk of heart attack, stroke, or other health problems in patients with risk factors for heart disease. Diet and lifestyle changes are often used with this drug.  This medicine may be used for other purposes; ask your health care provider or pharmacist if you have questions.  COMMON BRAND NAME(S): Crestor  What should I tell my health care provider before I take this medicine?  They need to know if you have any of these conditions:  -diabetes  -if you often drink alcohol  -history of stroke  -kidney disease  -liver disease  -muscle aches or weakness  -thyroid disease  -an unusual or allergic reaction to rosuvastatin, other medicines, foods, dyes, or preservatives  -pregnant or trying to get pregnant  -breast-feeding  How should I use this medicine?  Take this medicine by mouth with a glass of water. Follow the directions on the prescription label. Do not cut, crush or chew this medicine. You can take this medicine with or without food. Take your doses at regular intervals. Do not take your medicine more often than directed.  Talk to your pediatrician regarding the use of this medicine in children. While this drug may be prescribed for children as young as 7 years old for selected conditions, precautions do apply.  Overdosage: If you think you have taken too much of this medicine contact a poison control center or emergency room at once.  NOTE: This medicine is only for you. Do not share this medicine with others.  What if I miss a dose?  If you miss a dose, take it as soon as you can. If your next dose is to be taken in less than 12 hours, then do not take the missed dose. Take the next dose at your regular time. Do not take double or extra doses.  What may interact with this medicine?  Do not take  this medicine with any of the following medications:  -herbal medicines like red yeast rice  This medicine may also interact with the following medications:  -alcohol  -antacids containing aluminum hydroxide or magnesium hydroxide  -cyclosporine  -other medicines for high cholesterol  -some medicines for HIV infection  -warfarin  This list may not describe all possible interactions. Give your health care provider a list of all the medicines, herbs, non-prescription drugs, or dietary supplements you use. Also tell them if you smoke, drink alcohol, or use illegal drugs. Some items may interact with your medicine.  What should I watch for while using this medicine?  Visit your doctor or health care professional for regular check-ups. You may need regular tests to make sure your liver is working properly.  Your health care professional may tell you to stop taking this medicine if you develop muscle problems. If your muscle problems do not go away after stopping this medicine, contact your health care professional.  Do not become pregnant while taking this medicine. Women should inform their health care professional if they wish to become pregnant or think they might be pregnant. There is a potential for serious side effects to an unborn child. Talk to your health care professional or pharmacist for more information. Do not breast-feed an infant while taking this medicine.  This medicine may affect blood sugar levels. If you have diabetes, check with   your doctor or health care professional before you change your diet or the dose of your diabetic medicine.  If you are going to need surgery or other procedure, tell your doctor that you are using this medicine.  This drug is only part of a total heart-health program. Your doctor or a dietician can suggest a low-cholesterol and low-fat diet to help. Avoid alcohol and smoking, and keep a proper exercise schedule.  This medicine may cause a decrease in Co-Enzyme Q-10. You  should make sure that you get enough Co-Enzyme Q-10 while you are taking this medicine. Discuss the foods you eat and the vitamins you take with your health care professional.  What side effects may I notice from receiving this medicine?  Side effects that you should report to your doctor or health care professional as soon as possible:  -allergic reactions like skin rash, itching or hives, swelling of the face, lips, or tongue  -dark urine  -fever  -joint pain  -muscle cramps, pain  -redness, blistering, peeling or loosening of the skin, including inside the mouth  -trouble passing urine or change in the amount of urine  -unusually weak or tired  -yellowing of the eyes or skin  Side effects that usually do not require medical attention (report to your doctor or health care professional if they continue or are bothersome):  -constipation  -heartburn  -nausea  -stomach gas, pain, upset  This list may not describe all possible side effects. Call your doctor for medical advice about side effects. You may report side effects to FDA at 1-800-FDA-1088.  Where should I keep my medicine?  Keep out of the reach of children.  Store at room temperature between 20 and 25 degrees C (68 and 77 degrees F). Keep container tightly closed (protect from moisture). Throw away any unused medicine after the expiration date.  NOTE: This sheet is a summary. It may not cover all possible information. If you have questions about this medicine, talk to your doctor, pharmacist, or health care provider.   2019 Elsevier/Gold Standard (2017-07-10 12:42:43)

## 2019-05-07 ENCOUNTER — Other Ambulatory Visit: Payer: Self-pay

## 2019-05-07 LAB — LIPID PANEL W/O CHOL/HDL RATIO
Cholesterol, Total: 224 mg/dL — ABNORMAL HIGH (ref 100–199)
HDL: 44 mg/dL (ref >39)
LDL Calculated: 145 mg/dL — ABNORMAL HIGH (ref 0–99)
Triglycerides: 173 mg/dL — ABNORMAL HIGH (ref 0–149)
VLDL Cholesterol Cal: 35 mg/dL (ref 5–40)

## 2019-05-07 LAB — COMPREHENSIVE METABOLIC PANEL
ALT: 13 IU/L (ref 0–32)
AST: 20 IU/L (ref 0–40)
Albumin/Globulin Ratio: 1.5 (ref 1.2–2.2)
Albumin: 4 g/dL (ref 3.8–4.8)
Alkaline Phosphatase: 59 IU/L (ref 39–117)
BUN/Creatinine Ratio: 16 (ref 12–28)
BUN: 15 mg/dL (ref 8–27)
Bilirubin Total: 0.3 mg/dL (ref 0.0–1.2)
CO2: 26 mmol/L (ref 20–29)
Calcium: 9.3 mg/dL (ref 8.7–10.3)
Chloride: 104 mmol/L (ref 96–106)
Creatinine, Ser: 0.93 mg/dL (ref 0.57–1.00)
GFR calc Af Amer: 72 mL/min/{1.73_m2} (ref >59)
GFR calc non Af Amer: 62 mL/min/{1.73_m2} (ref >59)
Globulin, Total: 2.7 g/dL (ref 1.5–4.5)
Glucose: 79 mg/dL (ref 65–99)
Potassium: 4.6 mmol/L (ref 3.5–5.2)
Sodium: 142 mmol/L (ref 134–144)
Total Protein: 6.7 g/dL (ref 6.0–8.5)

## 2019-05-07 MED ORDER — FLUTICASONE PROPIONATE 50 MCG/ACT NA SUSP: 2.0000 | Freq: Every day | NASAL | 6 refills | Status: DC

## 2019-05-07 NOTE — Telephone Encounter (Signed)
Pt.notified

## 2019-07-17 ENCOUNTER — Other Ambulatory Visit: Payer: Self-pay | Admitting: Family Medicine

## 2019-07-17 DIAGNOSIS — F32A Depression, unspecified: Secondary | ICD-10-CM

## 2019-07-17 DIAGNOSIS — F329 Major depressive disorder, single episode, unspecified: Secondary | ICD-10-CM

## 2019-07-31 ENCOUNTER — Ambulatory Visit: Payer: PPO

## 2019-08-04 DIAGNOSIS — H26491 Other secondary cataract, right eye: Secondary | ICD-10-CM | POA: Diagnosis not present

## 2019-08-15 DIAGNOSIS — H26492 Other secondary cataract, left eye: Secondary | ICD-10-CM | POA: Diagnosis not present

## 2019-09-05 ENCOUNTER — Encounter: Payer: PPO | Admitting: Nurse Practitioner

## 2019-10-15 ENCOUNTER — Encounter: Payer: Self-pay | Admitting: Nurse Practitioner

## 2019-10-15 ENCOUNTER — Telehealth: Payer: Self-pay | Admitting: Nurse Practitioner

## 2019-10-15 ENCOUNTER — Other Ambulatory Visit: Payer: Self-pay

## 2019-10-15 ENCOUNTER — Ambulatory Visit (INDEPENDENT_AMBULATORY_CARE_PROVIDER_SITE_OTHER): Payer: PPO | Admitting: Nurse Practitioner

## 2019-10-15 VITALS — BP 132/81 | HR 72 | Temp 98.2°F | Ht 62.8 in | Wt 173.0 lb

## 2019-10-15 DIAGNOSIS — M797 Fibromyalgia: Secondary | ICD-10-CM | POA: Diagnosis not present

## 2019-10-15 DIAGNOSIS — I10 Essential (primary) hypertension: Secondary | ICD-10-CM | POA: Diagnosis not present

## 2019-10-15 DIAGNOSIS — K219 Gastro-esophageal reflux disease without esophagitis: Secondary | ICD-10-CM

## 2019-10-15 DIAGNOSIS — M810 Age-related osteoporosis without current pathological fracture: Secondary | ICD-10-CM | POA: Diagnosis not present

## 2019-10-15 DIAGNOSIS — Z0000 Encounter for general adult medical examination without abnormal findings: Secondary | ICD-10-CM | POA: Diagnosis not present

## 2019-10-15 DIAGNOSIS — E7800 Pure hypercholesterolemia, unspecified: Secondary | ICD-10-CM | POA: Diagnosis not present

## 2019-10-15 DIAGNOSIS — F3342 Major depressive disorder, recurrent, in full remission: Secondary | ICD-10-CM

## 2019-10-15 DIAGNOSIS — N3001 Acute cystitis with hematuria: Secondary | ICD-10-CM | POA: Diagnosis not present

## 2019-10-15 DIAGNOSIS — E559 Vitamin D deficiency, unspecified: Secondary | ICD-10-CM

## 2019-10-15 DIAGNOSIS — Z6830 Body mass index (BMI) 30.0-30.9, adult: Secondary | ICD-10-CM | POA: Diagnosis not present

## 2019-10-15 DIAGNOSIS — Z1231 Encounter for screening mammogram for malignant neoplasm of breast: Secondary | ICD-10-CM

## 2019-10-15 DIAGNOSIS — E6609 Other obesity due to excess calories: Secondary | ICD-10-CM

## 2019-10-15 DIAGNOSIS — N390 Urinary tract infection, site not specified: Secondary | ICD-10-CM | POA: Insufficient documentation

## 2019-10-15 MED ORDER — ALBENDAZOLE 200 MG PO TABS: ORAL_TABLET | ORAL | 0 refills | Status: DC

## 2019-10-15 MED ORDER — AMOXICILLIN-POT CLAVULANATE 875-125 MG PO TABS: 1.0000 | ORAL_TABLET | Freq: Two times a day (BID) | ORAL | 0 refills | Status: DC

## 2019-10-15 NOTE — Assessment & Plan Note (Signed)
Chronic, stable on Gabapentin.  Continue current medication and adjust as needed.

## 2019-10-15 NOTE — Assessment & Plan Note (Signed)
Order for repeat DEXA, as is due.  Would consider referral to endo or ortho to discuss alternate options for treatment, as concern for exacerbation of GERD with oral medications.

## 2019-10-15 NOTE — Assessment & Plan Note (Signed)
Chronic, stable on current regimen with PHQ9 = 6.  Denies SI/HI.  Continue current medication regimen and adjust as needed.

## 2019-10-15 NOTE — Assessment & Plan Note (Signed)
Chronic, stable with no medications.  Continue current diet control and adjust plan as needed based on readings.  Continue to check BP at home regularly and document.  Initiate medication as needed. 

## 2019-10-15 NOTE — Assessment & Plan Note (Signed)
UA noting trace BLD, positive NIT, trace Leu, and many bacteria.  Due to age will treat with Augmentin, script sent, and adjust abx regimen based on culture.  Recommend increased water hydration at home and may take Azo for comfort. Return to office for worsening or continued symptoms.

## 2019-10-15 NOTE — Assessment & Plan Note (Signed)
Chronic, stable.  Continue Prilosec and adjust dose as needed.  She reports benefit from this medication.  Check Mag level annually. 

## 2019-10-15 NOTE — Patient Instructions (Addendum)
[facility]  Address: [address], [place], [place] [address]  Phone: [phone]  Pneumococcal Conjugate Vaccine (PCV13): What You Need to Know 1. Why get vaccinated? Pneumococcal conjugate vaccine (PCV13) can prevent pneumococcal disease. Pneumococcal disease refers to any illness caused by pneumococcal bacteria. These bacteria can cause many types of illnesses, including pneumonia, which is an infection of the lungs. Pneumococcal bacteria are one of the most common causes of pneumonia. Besides pneumonia, pneumococcal bacteria can also cause:  Ear infections  Sinus infections  Meningitis (infection of the tissue covering the brain and spinal cord)  Bacteremia (bloodstream infection) Anyone can get pneumococcal disease, but children under 71 years of age, people with certain medical conditions, adults 30 years or older, and cigarette smokers are at the highest risk. Most pneumococcal infections are mild. However, some can result in long-term problems, such as brain damage or hearing loss. Meningitis, bacteremia, and pneumonia caused by pneumococcal disease can be fatal. 2. PCV13 PCV13 protects against 13 types of bacteria that cause pneumococcal disease. Infants and young children usually need 4 doses of pneumococcal conjugate vaccine, at 2, 4, 6, and 56-30 months of age. In some cases, a child might need fewer than 4 doses to complete PCV13 vaccination. A dose of PCV23 vaccine is also recommended for anyone 2 years or older with certain medical conditions if they did not already receive PCV13. This vaccine may be given to adults 33 years or older based on discussions between the patient and health care provider. 3. Talk with your health care provider Tell your vaccine provider if the person getting the vaccine:  Has had an allergic reaction after a previous dose of PCV13, to an earlier pneumococcal conjugate vaccine known as PCV7, or to any  vaccine containing diphtheria toxoid (for example, DTaP), or has any severe, life-threatening allergies.  In some cases, your health care provider may decide to postpone PCV13 vaccination to a future visit. People with minor illnesses, such as a cold, may be vaccinated. People who are moderately or severely ill should usually wait until they recover before getting PCV13. Your health care provider can give you more information. 4. Risks of a vaccine reaction  Redness, swelling, pain, or tenderness where the shot is given, and fever, loss of appetite, fussiness (irritability), feeling tired, headache, and chills can happen after PCV13. Young children may be at increased risk for seizures caused by fever after PCV13 if it is administered at the same time as inactivated influenza vaccine. Ask your health care provider for more information. People sometimes faint after medical procedures, including vaccination. Tell your provider if you feel dizzy or have vision changes or ringing in the ears. As with any medicine, there is a very remote chance of a vaccine causing a severe allergic reaction, other serious injury, or death. 5. What if there is a serious problem? An allergic reaction could occur after the vaccinated person leaves the clinic. If you see signs of a severe allergic reaction (hives, swelling of the face and throat, difficulty breathing, a fast heartbeat, dizziness, or weakness), call 9-1-1 and get the person to the nearest hospital. For other signs that concern you, call your health care provider. Adverse reactions should be reported to the Vaccine Adverse Event Reporting System (VAERS). Your health care provider will usually file this report, or you can do it yourself. Visit the VAERS website at www.vaers.SamedayNews.es or call 908-355-3434. VAERS is only for reporting reactions, and VAERS staff do not  give medical advice. 6. The National Vaccine Injury Compensation Program The [employer] Vaccine  Injury Compensation Program (VICP) is a federal program that was created to compensate people who may have been injured by certain vaccines. Visit the VICP website at GoldCloset.com.ee or call 807-768-4318 to learn about the program and about filing a claim. There is a time limit to file a claim for compensation. 7. How can I learn more?  Ask your health care provider.  Call your local or state health department.  Contact the Centers for Disease Control and Prevention (CDC): ? Call (249) 082-4152 (1-800-CDC-INFO) or ? Visit CDC's website at http://hunter.com/ Vaccine Information Statement PCV13 Vaccine (09/18/2018) This information is not intended to replace advice given to you by your health care provider. Make sure you discuss any questions you have with your health care provider. Document Released: 09/03/2006 Document Revised: 02/25/2019 Document Reviewed: 06/18/2018 Elsevier Patient Education  2020 [place] and Cholesterol Restricted Eating Plan Getting too much fat and cholesterol in your diet may cause health problems. Choosing the right foods helps keep your fat and cholesterol at normal levels. This can keep you from getting certain diseases. Your doctor may recommend an eating plan that includes:  Total fat: ______% or less of total calories a day.  Saturated fat: ______% or less of total calories a day.  Cholesterol: less than _________mg a day.  Fiber: ______g a day. What are tips for following this plan? Meal planning  At meals, divide your plate into four equal parts: ? Fill one-half of your plate with vegetables and green salads. ? Fill one-fourth of your plate with whole grains. ? Fill one-fourth of your plate with low-fat (lean) protein foods.  Eat fish that is high in omega-3 fats at least two times a week. This includes mackerel, tuna, sardines, and salmon.  Eat foods that are high in fiber, such as whole grains, beans, apples,  broccoli, carrots, peas, and barley. General tips   Work with your doctor to lose weight if you need to.  Avoid: ? Foods with added sugar. ? Fried foods. ? Foods with partially hydrogenated oils.  Limit alcohol intake to no more than 1 drink a day for nonpregnant women and 2 drinks a day for men. One drink equals 12 oz of beer, 5 oz of wine, or 1 oz of hard liquor. Reading food labels  Check food labels for: ? Trans fats. ? Partially hydrogenated oils. ? Saturated fat (g) in each serving. ? Cholesterol (mg) in each serving. ? Fiber (g) in each serving.  Choose foods with healthy fats, such as: ? Monounsaturated fats. ? Polyunsaturated fats. ? Omega-3 fats.  Choose grain products that have whole grains. Look for the word "whole" as the first word in the ingredient list. Cooking  Cook foods using low-fat methods. These include baking, boiling, grilling, and broiling.  Eat more home-cooked foods. Eat at restaurants and buffets less often.  Avoid cooking using saturated fats, such as butter, cream, palm oil, palm kernel oil, and coconut oil. Recommended foods  Fruits  All fresh, canned (in natural juice), or frozen fruits. Vegetables  Fresh or frozen vegetables (raw, steamed, roasted, or grilled). Green salads. Grains  Whole grains, such as whole wheat or whole grain breads, crackers, cereals, and pasta. Unsweetened oatmeal, bulgur, barley, quinoa, or brown rice. Corn or whole wheat flour tortillas. Meats and other protein foods  Ground beef (85% or leaner), grass-fed beef, or beef trimmed of fat. Skinless chicken or [place]. Ground  chicken or [place]. Pork trimmed of fat. All fish and seafood. Egg whites. Dried beans, peas, or lentils. Unsalted nuts or seeds. Unsalted canned beans. Nut butters without added sugar or oil. Dairy  Low-fat or nonfat dairy products, such as skim or 1% milk, 2% or reduced-fat cheeses, low-fat and fat-free ricotta or cottage cheese, or plain  low-fat and nonfat yogurt. Fats and oils  Tub margarine without trans fats. Light or reduced-fat mayonnaise and salad dressings. Avocado. Olive, canola, sesame, or safflower oils. The items listed above may not be a complete list of foods and beverages you can eat. Contact a dietitian for more information. Foods to avoid Fruits  Canned fruit in heavy syrup. Fruit in cream or butter sauce. Fried fruit. Vegetables  Vegetables cooked in cheese, cream, or butter sauce. Fried vegetables. Grains  White bread. White pasta. White rice. Cornbread. Bagels, pastries, and croissants. Crackers and snack foods that contain trans fat and hydrogenated oils. Meats and other protein foods  Fatty cuts of meat. Ribs, chicken wings, bacon, sausage, bologna, salami, chitterlings, fatback, hot dogs, bratwurst, and packaged lunch meats. Liver and organ meats. Whole eggs and egg yolks. Chicken and [place] with skin. Fried meat. Dairy  Whole or 2% milk, cream, half-and-half, and cream cheese. Whole milk cheeses. Whole-fat or sweetened yogurt. Full-fat cheeses. Nondairy creamers and whipped toppings. Processed cheese, cheese spreads, and cheese curds. Beverages  Alcohol. Sugar-sweetened drinks such as sodas, lemonade, and fruit drinks. Fats and oils  Butter, stick margarine, lard, shortening, ghee, or bacon fat. Coconut, palm kernel, and palm oils. Sweets and desserts  Corn syrup, sugars, honey, and molasses. Candy. Jam and jelly. Syrup. Sweetened cereals. Cookies, pies, cakes, donuts, muffins, and ice cream. The items listed above may not be a complete list of foods and beverages you should avoid. Contact a dietitian for more information. Summary  Choosing the right foods helps keep your fat and cholesterol at normal levels. This can keep you from getting certain diseases.  At meals, fill one-half of your plate with vegetables and green salads.  Eat high-fiber foods, like whole grains, beans, apples,  carrots, peas, and barley.  Limit added sugar, saturated fats, alcohol, and fried foods. This information is not intended to replace advice given to you by your health care provider. Make sure you discuss any questions you have with your health care provider. Document Released: 05/07/2012 Document Revised: 07/10/2018 Document Reviewed: 07/24/2017 Elsevier Patient Education  2020 [employer].

## 2019-10-15 NOTE — Assessment & Plan Note (Signed)
Reports history of low Vit D level, will check this today and continue supplement.

## 2019-10-15 NOTE — Assessment & Plan Note (Signed)
Recommend continued focus on health diet choices and regular physical activity (30 minutes 5 days a week). 

## 2019-10-15 NOTE — Assessment & Plan Note (Signed)
Chronic, poor tolerance to statins.  Had trial of Lipitor and Crestor with immediate muscle cramps.  Will consider injectable such as Repatha if ongoing elevations.  Discussed at length with patient.

## 2019-10-15 NOTE — Telephone Encounter (Signed)
States she forgot to mention she had some pinworms today at visit.  Has had in past and wanted to see if medication could be sent in for this.  Reports she does bite fingernails from time to time, but practices "good hygiene".  Will send in script.

## 2019-10-15 NOTE — Progress Notes (Signed)
BP 132/81   Pulse 72   Temp 98.2 F (36.8 C) (Oral)   Ht 5' 2.8" (1.595 m)   Wt 173 lb (78.5 kg)   SpO2 98%   BMI 30.84 kg/m    Subjective:    Patient ID: Angela Martinez, female    DOB: 03/06/1951, 68 y.o.   MRN: [id number]  HPI: Angela Martinez is a 68 y.o. female presenting on 10/15/2019 for comprehensive medical examination. Current medical complaints include: uti symptoms  She currently lives with: husband Menopausal Symptoms: none  URINARY SYMPTOMS Reports back pain started about 3 days ago and mild burning with urination.  Has been drinking more soda recently and feels this is cause. Dysuria: burning Urinary frequency: yes Urgency: no Small volume voids: no Symptom severity: yes Urinary incontinence: no Foul odor: no Hematuria: no Abdominal pain: no Back pain: yes Suprapubic pain/pressure: yes Flank pain: no Fever:  no Vomiting: no Relief with cranberry juice: not taken Relief with pyridium: not taken Status: stable Previous urinary tract infection: years ago Recurrent urinary tract infection: no Sexual activity: No sexually active History of sexually transmitted disease: no Treatments attempted: increasing fluids   HYPERTENSION / HYPERLIPIDEMIA Has muscle pains with Lipitor, even on 3 day schedule.  Was switched to Crestor last visit in June and took three times a week, but immediately started with muscle pain 3-4 days after taking.  Discussed possible use of injectables, like Repatha, in future for HLD due to ongoing side effects with statin therapy.  She reports interest in this in future if needed, wishes to focus on diet at this time. Satisfied with current treatment? no Duration of hypertension: chronic BP monitoring frequency: rarely BP range: 120-130/80's BP medication side effects: no Duration of hyperlipidemia: chronic Cholesterol medication side effects: no Cholesterol supplements: none Medication compliance: good compliance Aspirin: no Recent  stressors: no Recurrent headaches: no Visual changes: no Palpitations: no Dyspnea: no Chest pain: no Lower extremity edema: no Dizzy/lightheaded: no  The 10-year ASCVD risk score Mikey Bussing DC Jr., et al., 2013) is: 10.8%   Values used to calculate the score:     Age: 30 years     Sex: Female     Is Non-Hispanic African American: No     Diabetic: No     Tobacco smoker: No     Systolic Blood Pressure: [id number] mmHg     Is BP treated: No     HDL Cholesterol: 44 mg/dL     Total Cholesterol: 224 mg/dL   GERD Continue on Prilosec.  Reports this is well-controlled with this. GERD control status: controlled  Satisfied with current treatment? yes Heartburn frequency:  Medication side effects: no  Medication compliance: stable Dysphagia: no Odynophagia:  no Hematemesis: no Blood in stool: no EGD: no   OSTEOPOROSIS: Takes Vitamin D every day.  Last DEXA in 2017 noted T -3.6.  No recent falls or fractures, but does have ongoing arthritis pain.  Has underlying GERD and does not want to take oral bisphosphonate due to risk for worsening GERD symptoms.  Takes Gabapentin for fibromyalgia.  DEPRESSION Continues on Prozac 20 MG daily. Mood status: stable Satisfied with current treatment?: yes Symptom severity: mild  Duration of current treatment : chronic Side effects: no Medication compliance: good compliance Psychotherapy/counseling: none Previous psychiatric medications: Prozac Depressed mood: no Anxious mood: no Anhedonia: no Significant weight loss or gain: no Insomnia: none Fatigue: no Feelings of worthlessness or guilt: no Impaired concentration/indecisiveness: no Suicidal ideations: no Hopelessness: no  Crying spells: no Depression screen [facility] 2/9 10/15/2019 09/02/2018 08/02/2018 07/26/2018 07/05/2017  Decreased Interest 0 0 0 0 0  Down, Depressed, Hopeless 1 1 0 0 0  PHQ - 2 Score 1 1 0 0 0  Altered sleeping 0 0 0 - -  Tired, decreased energy 1 1 0 - -  Change in appetite 3 0  0 - -  Feeling bad or failure about yourself  1 0 0 - -  Trouble concentrating 0 0 0 - -  Moving slowly or fidgety/restless 0 0 0 - -  Suicidal thoughts 0 0 0 - -  PHQ-9 Score 6 2 0 - -  Difficult doing work/chores Not difficult at all Not difficult at all Not difficult at all - -    The patient does not have a history of falls. I did not complete a risk assessment for falls. A plan of care for falls was not documented.   Past Medical History:  Past Medical History:  Diagnosis Date  . Anxiety   . Complication of anesthesia    WITH WRIST SURGERY HARD TO GET RELAXED. OK WITH KNEE / BACK  . Cough   . Depression   . Fibromyalgia   . GERD (gastroesophageal reflux disease)   . Herpes simplex    1 AND 2  . Hyperlipidemia   . OA (osteoarthritis)   . Osteopenia   . Overweight   . Plantar fasciitis   . Shortness of breath dyspnea    DOE  . Tremors of nervous system     Surgical History:  Past Surgical History:  Procedure Laterality Date  . BACK SURGERY    . BUNIONECTOMY    . CATARACT EXTRACTION W/PHACO Right 03/16/2016   Procedure: CATARACT EXTRACTION PHACO AND INTRAOCULAR LENS PLACEMENT (IOC);  Surgeon: Leandrew Koyanagi, MD;  Location: [facility] ORS;  Service: Ophthalmology;  Laterality: Right;  [place] 52.4 AP% 8.8 CDE 4.63 Fluid Pack Lot # [id number]  . CATARACT EXTRACTION W/PHACO Left 05/18/2016   Procedure: CATARACT EXTRACTION PHACO AND INTRAOCULAR LENS PLACEMENT ([place]);  Surgeon: Leandrew Koyanagi, MD;  Location: [facility] ORS;  Service: Ophthalmology;  Laterality: Left;  [place] 47.3 AP% 10.4 CDE 4.90 Fluid pack lot # [id number]  . DILATION AND CURETTAGE OF UTERUS     x 3  . JOINT REPLACEMENT    . LUMBAR FUSION     L4-L5 with cage  . TOTAL KNEE ARTHROPLASTY Left 2007  . WRIST SURGERY      Medications:  Current Outpatient Medications on File Prior to Visit  Medication Sig  . BIOTIN PO Take by mouth daily.  Marland Kitchen docusate sodium (COLACE) 50 MG capsule Take 50 mg by mouth daily.   Marland Kitchen  FLUoxetine (PROZAC) 20 MG capsule Take 20 mg by mouth daily.  . fluticasone (FLONASE) 50 MCG/ACT nasal spray Place 2 sprays into both nostrils daily.  Marland Kitchen gabapentin (NEURONTIN) 300 MG capsule Take 1 capsule (300 mg total) by mouth 2 (two) times daily.  Marland Kitchen loratadine (CLARITIN) 10 MG tablet Take 10 mg by mouth daily.  Marland Kitchen NAPROXEN PO Take by mouth as needed.  Marland Kitchen olopatadine (PATANOL) 0.1 % ophthalmic solution   . omeprazole (PRILOSEC) 20 MG capsule Take 1 capsule (20 mg total) by mouth daily. Please call and make an appointment ASAP  . valACYclovir (VALTREX) 500 MG tablet Take 500 mg by mouth as needed.  . Vitamin D, Cholecalciferol, 400 UNITS TABS Take 1,000 Units by mouth.   . prednisoLONE acetate (PRED FORTE) 1 % ophthalmic  suspension    No current facility-administered medications on file prior to visit.     Allergies:  Allergies  Allergen Reactions  . Atorvastatin     Muscle cramps  . Crestor [Rosuvastatin Calcium]     Muscle aches  . Lyrica [Pregabalin] Other (See Comments)    Spaced out  . Sulfur     Social History:  Social History   Socioeconomic History  . Marital status: Married    Spouse name: Not on file  . Number of children: Not on file  . Years of education: Not on file  . Highest education level: High school graduate  Occupational History  . Not on file  Social Needs  . Financial resource strain: Not hard at all  . Food insecurity    Worry: Never true    Inability: Never true  . Transportation needs    Medical: No    Non-medical: No  Tobacco Use  . Smoking status: Never Smoker  . Smokeless tobacco: Never Used  Substance and Sexual Activity  . Alcohol use: No  . Drug use: No  . Sexual activity: Not on file  Lifestyle  . Physical activity    Days per week: 0 days    Minutes per session: 0 min  . Stress: Not at all  Relationships  . Social Herbalist on phone: Not on file    Gets together: Not on file    Attends religious service: More  than 4 times per year    Active member of club or organization: Not on file    Attends meetings of clubs or organizations: Not on file    Relationship status: Married  . Intimate partner violence    Fear of current or ex partner: Not on file    Emotionally abused: Not on file    Physically abused: Not on file    Forced sexual activity: Not on file  Other Topics Concern  . Not on file  Social History Narrative  . Not on file   Social History   Tobacco Use  Smoking Status Never Smoker  Smokeless Tobacco Never Used   Social History   Substance and Sexual Activity  Alcohol Use No    Family History:  Family History  Problem Relation Age of Onset  . Alcohol abuse Father   . Alcohol abuse Sister   . Arthritis Sister        5 sisters, some with RA  . Seizures Sister   . Cancer Sister        breast  . Diabetes Brother   . Breast cancer Neg Hx     Past medical history, surgical history, medications, allergies, family history and social history reviewed with patient today and changes made to appropriate areas of the chart.   Review of Systems - uti symptoms All other ROS negative except what is listed above and in the HPI.      Objective:    BP 132/81   Pulse 72   Temp 98.2 F (36.8 C) (Oral)   Ht 5' 2.8" (1.595 m)   Wt 173 lb (78.5 kg)   SpO2 98%   BMI 30.84 kg/m   Wt Readings from Last 3 Encounters:  10/15/19 173 lb (78.5 kg)  12/03/18 170 lb (77.1 kg)  11/22/18 171 lb (77.6 kg)    Physical Exam Constitutional:      General: She is awake. She is not in acute distress.    Appearance: She is  well-developed. She is not ill-appearing.  HENT:     Head: Normocephalic and atraumatic.     Right Ear: Hearing, tympanic membrane, ear canal and external ear normal. No drainage.     Left Ear: Hearing, tympanic membrane, ear canal and external ear normal. No drainage.     Nose: Nose normal.     Right Sinus: No maxillary sinus tenderness or frontal sinus tenderness.      Left Sinus: No maxillary sinus tenderness or frontal sinus tenderness.     Mouth/Throat:     Mouth: Mucous membranes are moist.     Pharynx: Oropharynx is clear. Uvula midline. No pharyngeal swelling, oropharyngeal exudate or posterior oropharyngeal erythema.  Eyes:     General: Lids are normal.        Right eye: No discharge.        Left eye: No discharge.     Extraocular Movements: Extraocular movements intact.     Conjunctiva/sclera: Conjunctivae normal.     Pupils: Pupils are equal, round, and reactive to light.     Visual Fields: Right eye visual fields normal and left eye visual fields normal.  Neck:     Musculoskeletal: Normal range of motion and neck supple.     Thyroid: No thyromegaly.     Vascular: No carotid bruit.     Trachea: Trachea normal.  Cardiovascular:     Rate and Rhythm: Normal rate and regular rhythm.     Heart sounds: Normal heart sounds. No murmur. No gallop.   Pulmonary:     Effort: Pulmonary effort is normal. No accessory muscle usage or respiratory distress.     Breath sounds: Normal breath sounds.  Chest:     Breasts:        Right: Normal.        Left: Normal.  Abdominal:     General: Bowel sounds are normal.     Palpations: Abdomen is soft. There is no hepatomegaly or splenomegaly.     Tenderness: There is abdominal tenderness in the suprapubic area. There is no right CVA tenderness or left CVA tenderness.  Musculoskeletal: Normal range of motion.     Right lower leg: No edema.     Left lower leg: No edema.  Lymphadenopathy:     Head:     Right side of head: No submental, submandibular, tonsillar, preauricular or posterior auricular adenopathy.     Left side of head: No submental, submandibular, tonsillar, preauricular or posterior auricular adenopathy.     Cervical: No cervical adenopathy.     Upper Body:     Right upper body: No supraclavicular, axillary or pectoral adenopathy.     Left upper body: No supraclavicular, axillary or pectoral  adenopathy.  Skin:    General: Skin is warm and dry.     Capillary Refill: Capillary refill takes less than 2 seconds.     Findings: No rash.  Neurological:     Mental Status: She is alert and oriented to person, place, and time.     Cranial Nerves: Cranial nerves are intact.     Gait: Gait is intact.     Deep Tendon Reflexes: Reflexes are normal and symmetric.     Reflex Scores:      Brachioradialis reflexes are 2+ on the right side and 2+ on the left side.      Patellar reflexes are 2+ on the right side and 2+ on the left side. Psychiatric:        Attention and Perception:  Attention normal.        Mood and Affect: Mood normal.        Speech: Speech normal.        Behavior: Behavior normal. Behavior is cooperative.        Thought Content: Thought content normal.        Judgment: Judgment normal.     Results for orders placed or performed in visit on 10/15/19  Microscopic Examination   URINE  Result Value Ref Range   WBC, UA 6-10 (A) 0 - 5 /hpf   RBC 0-2 0 - 2 /hpf   Epithelial Cells (non renal) 0-10 0 - 10 /hpf   Bacteria, UA Many (A) None seen/Few  Urine Culture, Reflex   URINE  Result Value Ref Range   Urine Culture, Routine WILL FOLLOW   UA/M w/rflx Culture, Routine   Specimen: Urine   URINE  Result Value Ref Range   Specific Gravity, UA 1.020 1.005 - 1.030   pH, UA 5.5 5.0 - 7.5   Color, UA Yellow Yellow   Appearance Ur Hazy (A) Clear   Leukocytes,UA Trace (A) Negative   Protein,UA Negative Negative/Trace   Glucose, UA Negative Negative   Ketones, UA Negative Negative   RBC, UA Trace (A) Negative   Bilirubin, UA Negative Negative   Urobilinogen, Ur 0.2 0.2 - 1.0 mg/dL   Nitrite, UA Positive (A) Negative   Microscopic Examination See below:    Urinalysis Reflex Comment       Assessment & Plan:   Problem List Items Addressed This Visit      Cardiovascular and Mediastinum   Essential hypertension    Chronic, stable with no medications.  Continue current  diet control and adjust plan as needed based on readings.  Continue to check BP at home regularly and document.  Initiate medication as needed.      Relevant Orders   CBC with Differential/Platelet out     Digestive   GERD (gastroesophageal reflux disease)    Chronic, stable.  Continue Prilosec and adjust dose as needed.  She reports benefit from this medication.  Check Mag level annually.      Relevant Orders   Magnesium     Musculoskeletal and Integument   Osteoporosis    Order for repeat DEXA, as is due.  Would consider referral to endo or ortho to discuss alternate options for treatment, as concern for exacerbation of GERD with oral medications.      Relevant Orders   DG Bone Density     Genitourinary   UTI (urinary tract infection)    UA noting trace BLD, positive NIT, trace Leu, and many bacteria.  Due to age will treat with Augmentin, script sent, and adjust abx regimen based on culture.  Recommend increased water hydration at home and may take Azo for comfort. Return to office for worsening or continued symptoms.        Other   Fibromyalgia    Chronic, stable on Gabapentin.  Continue current medication and adjust as needed.        Relevant Medications   FLUoxetine (PROZAC) 20 MG capsule   NAPROXEN PO   Hyperlipidemia    Chronic, poor tolerance to statins.  Had trial of Lipitor and Crestor with immediate muscle cramps.  Will consider injectable such as Repatha if ongoing elevations.  Discussed at length with patient.      Relevant Orders   Comprehensive metabolic panel   Lipid Panel w/o Chol/HDL Ratio out  Obesity    Recommend continued focus on health diet choices and regular physical activity (30 minutes 5 days a week).       Depression    Chronic, stable on current regimen with PHQ9 = 6.  Denies SI/HI.  Continue current medication regimen and adjust as needed.        Relevant Medications   FLUoxetine (PROZAC) 20 MG capsule   Vitamin D deficiency     Reports history of low Vit D level, will check this today and continue supplement.      Relevant Orders   Vit D  25 hydroxy (rtn osteoporosis monitoring)    Other Visit Diagnoses    Annual physical exam    -  Primary   Relevant Orders   CBC with Differential/Platelet out   TSH   UA/M w/rflx Culture, Routine (Completed)   Encounter for screening mammogram for malignant neoplasm of breast       Relevant Orders   MM DIGITAL SCREENING BILATERAL       Follow up plan: Return in about 6 months (around 04/13/2020) for HTN/HLD, Mood, GERD.   LABORATORY TESTING:  - Pap smear: not applicable  IMMUNIZATIONS:   - Tdap: Tetanus vaccination status reviewed: last tetanus booster within 10 years. - Influenza: Up to date - Pneumovax: Refused - Prevnar: Refused - HPV: Not applicable - Zostavax vaccine: Refused  SCREENING: -Mammogram: Ordered today  - Colonoscopy: Up to date  - Bone Density: Ordered today -- last 2017 noted osteoporosis T -3.6 -Hearing Test: Not applicable  -Spirometry: Not applicable   PATIENT COUNSELING:   Advised to take 1 mg of folate supplement per day if capable of pregnancy.   Sexuality: Discussed sexually transmitted diseases, partner selection, use of condoms, avoidance of unintended pregnancy  and contraceptive alternatives.   Advised to avoid cigarette smoking.  I discussed with the patient that most people either abstain from alcohol or drink within safe limits (<=14/week and <=4 drinks/occasion for males, <=7/weeks and <= 3 drinks/occasion for females) and that the risk for alcohol disorders and other health effects rises proportionally with the number of drinks per week and how often a drinker exceeds daily limits.  Discussed cessation/primary prevention of drug use and availability of treatment for abuse.   Diet: Encouraged to adjust caloric intake to maintain  or achieve ideal body weight, to reduce intake of dietary saturated fat and total fat, to limit  sodium intake by avoiding high sodium foods and not adding table salt, and to maintain adequate dietary potassium and calcium preferably from fresh fruits, vegetables, and low-fat dairy products.    stressed the importance of regular exercise  Injury prevention: Discussed safety belts, safety helmets, smoke detector, smoking near bedding or upholstery.   Dental health: Discussed importance of regular tooth brushing, flossing, and dental visits.    NEXT PREVENTATIVE PHYSICAL DUE IN 1 YEAR. Return in about 6 months (around 04/13/2020) for HTN/HLD, Mood, GERD.

## 2019-10-16 LAB — CBC WITH DIFFERENTIAL/PLATELET
Basophils Absolute: 0 10*3/uL (ref 0.0–0.2)
Basos: 1 %
EOS (ABSOLUTE): 0.2 10*3/uL (ref 0.0–0.4)
Eos: 3 %
Hematocrit: 44.2 % (ref 34.0–46.6)
Hemoglobin: 13.7 g/dL (ref 11.1–15.9)
Immature Grans (Abs): 0 10*3/uL (ref 0.0–0.1)
Immature Granulocytes: 0 %
Lymphocytes Absolute: 1.8 10*3/uL (ref 0.7–3.1)
Lymphs: 34 %
MCH: 24.9 pg — ABNORMAL LOW (ref 26.6–33.0)
MCHC: 31 g/dL — ABNORMAL LOW (ref 31.5–35.7)
MCV: 80 fL (ref 79–97)
Monocytes Absolute: 0.3 10*3/uL (ref 0.1–0.9)
Monocytes: 6 %
Neutrophils Absolute: 3.1 10*3/uL (ref 1.4–7.0)
Neutrophils: 56 %
Platelets: 307 10*3/uL (ref 150–450)
RBC: 5.5 x10E6/uL — ABNORMAL HIGH (ref 3.77–5.28)
RDW: 15.4 % (ref 11.7–15.4)
WBC: 5.4 10*3/uL (ref 3.4–10.8)

## 2019-10-16 LAB — LIPID PANEL W/O CHOL/HDL RATIO
Cholesterol, Total: 225 mg/dL — ABNORMAL HIGH (ref 100–199)
HDL: 50 mg/dL (ref >39)
LDL Chol Calc (NIH): 157 mg/dL — ABNORMAL HIGH (ref 0–99)
Triglycerides: 101 mg/dL (ref 0–149)
VLDL Cholesterol Cal: 18 mg/dL (ref 5–40)

## 2019-10-16 LAB — COMPREHENSIVE METABOLIC PANEL
ALT: 10 IU/L (ref 0–32)
AST: 15 IU/L (ref 0–40)
Albumin/Globulin Ratio: 1.7 (ref 1.2–2.2)
Albumin: 4.2 g/dL (ref 3.8–4.8)
Alkaline Phosphatase: 61 IU/L (ref 39–117)
BUN/Creatinine Ratio: 14 (ref 12–28)
BUN: 13 mg/dL (ref 8–27)
Bilirubin Total: 0.3 mg/dL (ref 0.0–1.2)
CO2: 24 mmol/L (ref 20–29)
Calcium: 9.4 mg/dL (ref 8.7–10.3)
Chloride: 105 mmol/L (ref 96–106)
Creatinine, Ser: 0.93 mg/dL (ref 0.57–1.00)
GFR calc Af Amer: 72 mL/min/{1.73_m2} (ref >59)
GFR calc non Af Amer: 62 mL/min/{1.73_m2} (ref >59)
Globulin, Total: 2.5 g/dL (ref 1.5–4.5)
Glucose: 81 mg/dL (ref 65–99)
Potassium: 4.3 mmol/L (ref 3.5–5.2)
Sodium: 142 mmol/L (ref 134–144)
Total Protein: 6.7 g/dL (ref 6.0–8.5)

## 2019-10-16 LAB — VITAMIN D 25 HYDROXY (VIT D DEFICIENCY, FRACTURES): Vit D, 25-Hydroxy: 47.2 ng/mL (ref 30.0–100.0)

## 2019-10-16 LAB — TSH: TSH: 2.74 u[IU]/mL (ref 0.450–4.500)

## 2019-10-16 LAB — MAGNESIUM: Magnesium: 2.2 mg/dL (ref 1.6–2.3)

## 2019-10-20 LAB — UA/M W/RFLX CULTURE, ROUTINE
Bilirubin, UA: NEGATIVE
Glucose, UA: NEGATIVE
Ketones, UA: NEGATIVE
Nitrite, UA: POSITIVE — AB
Protein,UA: NEGATIVE
Specific Gravity, UA: 1.02 (ref 1.005–1.030)
Urobilinogen, Ur: 0.2 mg/dL (ref 0.2–1.0)
pH, UA: 5.5 (ref 5.0–7.5)

## 2019-10-20 LAB — URINE CULTURE, REFLEX

## 2019-10-20 LAB — MICROSCOPIC EXAMINATION

## 2019-11-05 ENCOUNTER — Other Ambulatory Visit: Payer: PPO

## 2019-12-18 ENCOUNTER — Telehealth: Payer: Self-pay | Admitting: Family Medicine

## 2019-12-18 NOTE — Chronic Care Management (AMB) (Signed)
°  Chronic Care Management   Outreach Note  12/18/2019 Name: Angela Martinez MRN: [id number] DOB: 1951/08/08  [name] is a 69 y.o. year old female who is a primary care patient of Crissman, Jeannette How, MD. I reached out to [employer] by phone today in response to a referral sent by [name]'s health plan.     An unsuccessful telephone outreach was attempted today. The patient was referred to the case management team by for assistance with care management and care coordination.   Follow Up Plan: A HIPPA compliant phone message was left for the patient providing contact information and requesting a return call.  The care management team will reach out to the patient again over the next 7 days.  If patient returns call to provider office, please advise to call [facility] at [address], [place], [place], [place] [address] Direct Dial: [phone] [email or web address] Website: [email or web address]

## 2019-12-23 NOTE — Chronic Care Management (AMB) (Signed)
  Chronic Care Management   Outreach Note  12/23/2019 Name: VIVIENNE SULLINGER MRN: [id number] DOB: 1951/09/21  Celso Amy Woodcox is a 69 y.o. year old female who is a primary care patient of Crissman, Jeannette How, MD. I reached out to [employer] by phone today in response to a referral sent by Ms. Sun J Mcguffee's health plan.     A second unsuccessful telephone outreach was attempted today. The patient was referred to the case management team for assistance with care management and care coordination.   Follow Up Plan: A HIPPA compliant phone message was left for the patient providing contact information and requesting a return call.  The care management team will reach out to the patient again over the next 7 days.  If patient returns call to provider office, please advise to call [facility]  at [address], [address], [place], [place] [address] Direct Dial: [phone] [email or web address] Website: [email or web address]

## 2019-12-25 NOTE — Chronic Care Management (AMB) (Signed)
  Chronic Care Management   Outreach Note  12/25/2019 Name: Angela Martinez MRN: [id number] DOB: [birth year]  Angela Martinez is a 69 y.o. year old female who is a primary care patient of Crissman, Jeannette How, MD. I reached out to [employer] by phone today in response to a referral sent by Angela Martinez's health plan.     Third unsuccessful telephone outreach was attempted today. The patient was referred to the case management team for assistance with care management and care coordination. The patient's primary care provider has been notified of our unsuccessful attempts to make or maintain contact with the patient. The care management team is pleased to engage with this patient at any time in the future should he/she be interested in assistance from the care management team.   Follow Up Plan: The patient has been provided with contact information for the care management team and has been advised to call with any health related questions or concerns.   Angela Martinez, [name], [name], [name] [address] Direct Dial: [phone] [email or web address] Website: [email or web address]

## 2020-01-02 ENCOUNTER — Other Ambulatory Visit: Payer: Self-pay | Admitting: Unknown Physician Specialty

## 2020-01-02 NOTE — Telephone Encounter (Signed)
LOV 10/15/19

## 2020-01-02 NOTE — Telephone Encounter (Signed)
Requested medications are due for refill today?  Historical med   Requested medications are on active medication list?  Yes  Last Refill:   unknown as is listed on med list as historical  Future visit scheduled?  Has an appointment with Marnee Guarneri in 3 months.    Notes to Clinic:

## 2020-01-14 ENCOUNTER — Other Ambulatory Visit: Payer: Self-pay | Admitting: Unknown Physician Specialty

## 2020-01-14 DIAGNOSIS — K219 Gastro-esophageal reflux disease without esophagitis: Secondary | ICD-10-CM

## 2020-01-14 NOTE — Telephone Encounter (Signed)
Requested medication (s) are due for refill today: yes  Requested medication (s) are on the active medication list: yes  Last refill:  08/02/18  Future visit scheduled: yes  Notes to clinic:  RX expired    Requested Prescriptions  Pending Prescriptions Disp Refills   omeprazole (PRILOSEC) 20 MG capsule [Pharmacy Med Name: OMEPRAZOLE DR 20 MG CAPSULE] 90 capsule 0    Sig: Take 1 capsule (20 mg total) by mouth daily. Please call and make an appointment ASAP      Gastroenterology: Proton Pump Inhibitors Passed - 01/14/2020 10:31 AM      Passed - Valid encounter within last 12 months    Recent Outpatient Visits           3 months ago Annual physical exam   [facility] [place], Barbaraann Faster, NP   8 months ago Essential hypertension   [place], Barbaraann Faster, NP   9 months ago Cough   [place], Henrine Screws T, NP   10 months ago Cough   [facility], Barbaraann Faster, NP   1 year ago Pure hypercholesterolemia   [place], Barbaraann Faster, NP       Future Appointments             In 3 months [place], Barbaraann Faster, NP [employer]

## 2020-01-14 NOTE — Telephone Encounter (Signed)
Patient last seen 10/15/19 and has appointment 04/13/20

## 2020-02-24 ENCOUNTER — Telehealth: Payer: Self-pay | Admitting: Family Medicine

## 2020-02-24 NOTE — Chronic Care Management (AMB) (Signed)
  Chronic Care Management   Note  02/24/2020 Name: Angela Martinez MRN: [id number] DOB: 1950-11-28  [name] is a 69 y.o. year old female who is a primary care patient of Crissman, Jeannette How, MD. I reached out to [employer] by phone today in response to a referral sent by [name]'s health plan.     [name] was given information about Chronic Care Management services today including:  1. CCM service includes personalized support from designated clinical staff supervised by her physician, including individualized plan of care and coordination with other care providers 2. 24/7 contact phone numbers for assistance for urgent and routine care needs. 3. Service will only be billed when office clinical staff spend 20 minutes or more in a month to coordinate care. 4. Only one practitioner may furnish and bill the service in a calendar month. 5. The patient may stop CCM services at any time (effective at the end of the month) by phone call to the office staff. 6. The patient will be responsible for cost sharing (co-pay) of up to 20% of the service fee (after annual deductible is met).  Patient did not agree to enrollment in care management services and does not wish to consider at this time.  Follow up plan: The patient has been provided with contact information for the care management team and has been advised to call with any health related questions or concerns.   Noreene Larsson, [place], [place], [place] [address] Direct Dial: [phone] [email or web address] Website: [email or web address]

## 2020-03-05 ENCOUNTER — Other Ambulatory Visit: Payer: Self-pay

## 2020-03-05 ENCOUNTER — Encounter: Payer: Self-pay | Admitting: Nurse Practitioner

## 2020-03-05 ENCOUNTER — Ambulatory Visit (INDEPENDENT_AMBULATORY_CARE_PROVIDER_SITE_OTHER): Payer: PPO | Admitting: Nurse Practitioner

## 2020-03-05 VITALS — BP 126/80 | HR 89 | Temp 98.5°F | Wt 175.2 lb

## 2020-03-05 DIAGNOSIS — R82998 Other abnormal findings in urine: Secondary | ICD-10-CM | POA: Diagnosis not present

## 2020-03-05 DIAGNOSIS — R300 Dysuria: Secondary | ICD-10-CM | POA: Diagnosis not present

## 2020-03-05 DIAGNOSIS — R350 Frequency of micturition: Secondary | ICD-10-CM | POA: Insufficient documentation

## 2020-03-05 MED ORDER — AMOXICILLIN-POT CLAVULANATE 875-125 MG PO TABS: 1.0000 | ORAL_TABLET | Freq: Two times a day (BID) | ORAL | 0 refills | Status: AC

## 2020-03-05 NOTE — Patient Instructions (Addendum)
[facility]  Address: [address], [place], [place] [address]  Phone: [phone]  Urinary Tract Infection, Adult  A urinary tract infection (UTI) is an infection of any part of the urinary tract. The urinary tract includes the kidneys, ureters, bladder, and urethra. These organs make, store, and get rid of urine in the body. Your health care provider may use other names to describe the infection. An upper UTI affects the ureters and kidneys (pyelonephritis). A lower UTI affects the bladder (cystitis) and urethra (urethritis). What are the causes? Most urinary tract infections are caused by bacteria in your genital area, around the entrance to your urinary tract (urethra). These bacteria grow and cause inflammation of your urinary tract. What increases the risk? You are more likely to develop this condition if:  You have a urinary catheter that stays in place (indwelling).  You are not able to control when you urinate or have a bowel movement (you have incontinence).  You are female and you: ? Use a spermicide or diaphragm for birth control. ? Have low estrogen levels. ? Are pregnant.  You have certain genes that increase your risk (genetics).  You are sexually active.  You take antibiotic medicines.  You have a condition that causes your flow of urine to slow down, such as: ? An enlarged prostate, if you are female. ? Blockage in your urethra (stricture). ? A kidney stone. ? A nerve condition that affects your bladder control (neurogenic bladder). ? Not getting enough to drink, or not urinating often.  You have certain medical conditions, such as: ? Diabetes. ? A weak disease-fighting system (immunesystem). ? Sickle cell disease. ? Gout. ? Spinal cord injury. What are the signs or symptoms? Symptoms of this condition include:  Needing to urinate right away (urgently).  Frequent urination or passing small amounts of urine  frequently.  Pain or burning with urination.  Blood in the urine.  Urine that smells bad or unusual.  Trouble urinating.  Cloudy urine.  Vaginal discharge, if you are female.  Pain in the abdomen or the lower back. You may also have:  Vomiting or a decreased appetite.  Confusion.  Irritability or tiredness.  A fever.  Diarrhea. The first symptom in older adults may be confusion. In some cases, they may not have any symptoms until the infection has worsened. How is this diagnosed? This condition is diagnosed based on your medical history and a physical exam. You may also have other tests, including:  Urine tests.  Blood tests.  Tests for sexually transmitted infections (STIs). If you have had more than one UTI, a cystoscopy or imaging studies may be done to determine the cause of the infections. How is this treated? Treatment for this condition includes:  Antibiotic medicine.  Over-the-counter medicines to treat discomfort.  Drinking enough water to stay hydrated. If you have frequent infections or have other conditions such as a kidney stone, you may need to see a health care provider who specializes in the urinary tract (urologist). In rare cases, urinary tract infections can cause sepsis. Sepsis is a life-threatening condition that occurs when the body responds to an infection. Sepsis is treated in the hospital with IV antibiotics, fluids, and other medicines. Follow these instructions at home:  Medicines  Take over-the-counter and prescription medicines only as told by your health care provider.  If you were prescribed an antibiotic medicine, take it as told by your health care provider. Do not stop using  the antibiotic even if you start to feel better. General instructions  Make sure you: ? Empty your bladder often and completely. Do not hold urine for long periods of time. ? Empty your bladder after sex. ? Wipe from front to back after a bowel movement  if you are female. Use each tissue one time when you wipe.  Drink enough fluid to keep your urine pale yellow.  Keep all follow-up visits as told by your health care provider. This is important. Contact a health care provider if:  Your symptoms do not get better after 1-2 days.  Your symptoms go away and then return. Get help right away if you have:  Severe pain in your back or your lower abdomen.  A fever.  Nausea or vomiting. Summary  A urinary tract infection (UTI) is an infection of any part of the urinary tract, which includes the kidneys, ureters, bladder, and urethra.  Most urinary tract infections are caused by bacteria in your genital area, around the entrance to your urinary tract (urethra).  Treatment for this condition often includes antibiotic medicines.  If you were prescribed an antibiotic medicine, take it as told by your health care provider. Do not stop using the antibiotic even if you start to feel better.  Keep all follow-up visits as told by your health care provider. This is important. This information is not intended to replace advice given to you by your health care provider. Make sure you discuss any questions you have with your health care provider. Document Revised: 10/24/2018 Document Reviewed: 05/16/2018 Elsevier Patient Education  2020 [employer].

## 2020-03-05 NOTE — Progress Notes (Signed)
BP 126/80   Pulse 89   Temp 98.5 F (36.9 C) (Oral)   Wt 175 lb 3.2 oz (79.5 kg)   SpO2 96%   BMI 31.23 kg/m    Subjective:    Patient ID: Angela Martinez, female    DOB: 1951-08-30, 69 y.o.   MRN: [id number]  HPI: Angela Martinez is a 69 y.o. female presenting with UTI symptoms.  Chief Complaint  Patient presents with  . Urinary Tract Infection    pt states she has been having low back pain for the last 2 weeks, states she has taken AZO but still feels a little bit of pain   URINARY SYMPTOMS Started 2 weeks ago and was gardening and thought she may have pulled a muscle in her back.   Dysuria: no Urinary frequency: yes Urgency: yes Small volume voids: yes Symptom severity: mild Urinary incontinence: no Foul odor: yes  Fever: no Hematuria: no Abdominal pain: no Back pain: yes Suprapubic pain/pressure: no Flank pain: yes Vomiting: no Relief with cranberry juice: no Relief with pyridium: not tried Status: stable Previous urinary tract infection: yes  Recurrent urinary tract infection: no Sexual activity: not sexually active History of sexually transmitted disease: no Treatments attempted: azo, tried beer, cran-apple juice    Allergies  Allergen Reactions  . Atorvastatin     Muscle cramps  . Crestor [Rosuvastatin Calcium]     Muscle aches  . Lyrica [Pregabalin] Other (See Comments)    Spaced out  . Sulfur    Outpatient Encounter Medications as of 03/05/2020  Medication Sig Note  . BIOTIN PO Take by mouth daily.   Marland Kitchen docusate sodium (COLACE) 50 MG capsule Take 50 mg by mouth daily.    Marland Kitchen FLUoxetine (PROZAC) 20 MG capsule Take 1 tablet (20 mg total) by mouth daily.   . fluticasone (FLONASE) 50 MCG/ACT nasal spray Place 2 sprays into both nostrils daily. 10/15/2019: As needed  . gabapentin (NEURONTIN) 300 MG capsule Take 1 capsule (300 mg total) by mouth 2 (two) times daily. 10/15/2019: As needed   . loratadine (CLARITIN) 10 MG tablet Take 10 mg by mouth daily.   Marland Kitchen  NAPROXEN PO Take by mouth as needed.   Marland Kitchen omeprazole (PRILOSEC) 20 MG capsule Take 1 capsule (20 mg total) by mouth daily. Please call and make an appointment ASAP   . valACYclovir (VALTREX) 500 MG tablet Take 500 mg by mouth as needed. 09/02/2018: As needed  . Vitamin D, Cholecalciferol, 400 UNITS TABS Take 1,000 Units by mouth.    Marland Kitchen albendazole (ALBENZA) 200 MG tablet Take two tablets today (400 MG) by mouth and then take two tablets (400 MG) in two weeks.  Take on empty stomach.   Marland Kitchen amoxicillin-clavulanate (AUGMENTIN) 875-125 MG tablet Take 1 tablet by mouth 2 (two) times daily for 7 days.   . [DISCONTINUED] olopatadine (PATANOL) 0.1 % ophthalmic solution  10/15/2019: As needed  . [DISCONTINUED] prednisoLONE acetate (PRED FORTE) 1 % ophthalmic suspension     No facility-administered encounter medications on file as of 03/05/2020.   Patient Active Problem List   Diagnosis Date Noted  . Leukocytes in urine 03/05/2020  . Urinary frequency 03/05/2020  . UTI (urinary tract infection) 10/15/2019  . Vitamin D deficiency 10/15/2019  . Eczema 08/02/2018  . Onychomycosis 08/02/2017  . Advanced care planning/counseling discussion 08/02/2017  . Trigeminal neuralgia of left side of face 12/27/2015  . Fibromyalgia   . OA (osteoarthritis)   . Osteoporosis   . Hyperlipidemia   .  Essential hypertension   . Obesity   . GERD (gastroesophageal reflux disease)   . Depression    Past Medical History:  Diagnosis Date  . Anxiety   . Complication of anesthesia    WITH WRIST SURGERY HARD TO GET RELAXED. OK WITH KNEE / BACK  . Cough   . Depression   . Fibromyalgia   . GERD (gastroesophageal reflux disease)   . Herpes simplex    1 AND 2  . Hyperlipidemia   . OA (osteoarthritis)   . Osteopenia   . Overweight   . Plantar fasciitis   . Shortness of breath dyspnea    DOE  . Tremors of nervous system    Relevant past medical, surgical, family and social history reviewed and updated as indicated.  Interim medical history since our last visit reviewed.  Review of Systems  Constitutional: Negative.  Negative for diaphoresis, fatigue and fever.  HENT: Negative.   Genitourinary: Positive for decreased urine volume, flank pain, frequency and urgency. Negative for difficulty urinating, dysuria, enuresis, hematuria, pelvic pain, vaginal discharge and vaginal pain.  Musculoskeletal: Positive for back pain. Negative for arthralgias and gait problem.  Skin: Negative.  Negative for color change and rash.  Neurological: Negative.  Negative for facial asymmetry, light-headedness and headaches.   Per HPI unless specifically indicated above     Objective:    BP 126/80   Pulse 89   Temp 98.5 F (36.9 C) (Oral)   Wt 175 lb 3.2 oz (79.5 kg)   SpO2 96%   BMI 31.23 kg/m   Wt Readings from Last 3 Encounters:  03/05/20 175 lb 3.2 oz (79.5 kg)  10/15/19 173 lb (78.5 kg)  12/03/18 170 lb (77.1 kg)    Physical Exam Vitals and nursing note reviewed.  Constitutional:      General: She is not in acute distress.    Appearance: Normal appearance. She is not toxic-appearing.  Eyes:     General: No scleral icterus.    Extraocular Movements: Extraocular movements intact.  Cardiovascular:     Rate and Rhythm: Normal rate.     Heart sounds: Normal heart sounds. No murmur.  Pulmonary:     Effort: Pulmonary effort is normal. No respiratory distress.     Breath sounds: No wheezing or rhonchi.  Abdominal:     General: Abdomen is flat. Bowel sounds are normal. There is no distension.     Palpations: Abdomen is soft. There is no mass.     Tenderness: There is no abdominal tenderness.  Musculoskeletal:        General: Normal range of motion.     Right lower leg: No edema.     Left lower leg: No edema.  Skin:    General: Skin is warm and dry.     Coloration: Skin is not jaundiced or pale.  Neurological:     General: No focal deficit present.     Mental Status: She is alert and oriented to person,  place, and time.     Motor: No weakness.     Gait: Gait normal.  Psychiatric:        Mood and Affect: Mood normal.        Behavior: Behavior normal.        Thought Content: Thought content normal.        Judgment: Judgment normal.    Urinalysis    Component Value Date/Time   APPEARANCEUR Clear 03/05/2020 1101   GLUCOSEU Negative 03/05/2020 1101   BILIRUBINUR  Negative 03/05/2020 1101   PROTEINUR Negative 03/05/2020 1101   NITRITE Positive (A) 03/05/2020 1101   LEUKOCYTESUR Trace (A) 03/05/2020 1101       Assessment & Plan:   Problem List Items Addressed This Visit      Other   Leukocytes in urine   Relevant Medications   amoxicillin-clavulanate (AUGMENTIN) 875-125 MG tablet   Urinary frequency - Primary    Acute, ongoing.  UA in office showed nitrites and leuks.  Will follow culture, treat with Augmentin bid in the meantime.  Advised on urinary hygiene and encouraged increased water intake.  With any worsening symptoms over the weekend, go to urgent care or ED.  If not better by Monday, return or call to clinic.      Relevant Medications   amoxicillin-clavulanate (AUGMENTIN) 875-125 MG tablet   Other Relevant Orders   UA/M w/rflx Culture, Routine (Completed)       Follow up plan: Return if symptoms worsen or fail to improve.

## 2020-03-05 NOTE — Assessment & Plan Note (Addendum)
Acute, ongoing.  UA in office showed nitrites and leuks.  Will follow culture, treat with Augmentin bid in the meantime.  Advised on urinary hygiene and encouraged increased water intake.  With any worsening symptoms over the weekend, go to urgent care or ED.  If not better by Monday, return or call to clinic.

## 2020-03-10 ENCOUNTER — Encounter: Payer: Self-pay | Admitting: Nurse Practitioner

## 2020-03-10 LAB — URINE CULTURE, REFLEX

## 2020-03-10 LAB — UA/M W/RFLX CULTURE, ROUTINE
Bilirubin, UA: NEGATIVE
Glucose, UA: NEGATIVE
Ketones, UA: NEGATIVE
Nitrite, UA: POSITIVE — AB
Protein,UA: NEGATIVE
RBC, UA: NEGATIVE
Specific Gravity, UA: 1.015 (ref 1.005–1.030)
Urobilinogen, Ur: 0.2 mg/dL (ref 0.2–1.0)
pH, UA: 5.5 (ref 5.0–7.5)

## 2020-03-10 LAB — MICROSCOPIC EXAMINATION: RBC, Urine: NONE SEEN /hpf (ref 0–2)

## 2020-04-13 ENCOUNTER — Ambulatory Visit: Payer: PPO | Admitting: Nurse Practitioner

## 2020-04-21 ENCOUNTER — Telehealth: Payer: Self-pay | Admitting: Family Medicine

## 2020-04-21 ENCOUNTER — Ambulatory Visit: Payer: PPO | Admitting: Nurse Practitioner

## 2020-04-21 NOTE — Telephone Encounter (Signed)
Agree with this plan for virtual visit, if patient is refusing then would benefit from UC visit today if symptomatic, especially in presence of active vomiting and diarrhea + sinus symptoms.  Would benefit from Covid testing.

## 2020-04-21 NOTE — Telephone Encounter (Signed)
Pt came in with sinus and vomiting diarrhea and nausea,Pt was offered virtual due to symptoms with provider,Pt was upset and started yelling and stated she did not want virtual, was offered multiple times for the appointment virtually today. Pt was advised to go to UC and emergence room due to refusing virtual apt with [place].

## 2020-04-23 ENCOUNTER — Ambulatory Visit: Payer: PPO | Admitting: Nurse Practitioner

## 2020-05-10 ENCOUNTER — Other Ambulatory Visit: Payer: Self-pay

## 2020-05-10 ENCOUNTER — Encounter: Payer: Self-pay | Admitting: Nurse Practitioner

## 2020-05-10 ENCOUNTER — Ambulatory Visit (INDEPENDENT_AMBULATORY_CARE_PROVIDER_SITE_OTHER): Payer: PPO | Admitting: Nurse Practitioner

## 2020-05-10 ENCOUNTER — Telehealth: Payer: Self-pay | Admitting: Nurse Practitioner

## 2020-05-10 ENCOUNTER — Other Ambulatory Visit: Payer: Self-pay | Admitting: Nurse Practitioner

## 2020-05-10 VITALS — BP 124/81 | HR 85 | Temp 98.1°F | Wt 175.6 lb

## 2020-05-10 DIAGNOSIS — F329 Major depressive disorder, single episode, unspecified: Secondary | ICD-10-CM | POA: Diagnosis not present

## 2020-05-10 DIAGNOSIS — Z6830 Body mass index (BMI) 30.0-30.9, adult: Secondary | ICD-10-CM | POA: Diagnosis not present

## 2020-05-10 DIAGNOSIS — E6609 Other obesity due to excess calories: Secondary | ICD-10-CM | POA: Diagnosis not present

## 2020-05-10 DIAGNOSIS — F32A Depression, unspecified: Secondary | ICD-10-CM

## 2020-05-10 DIAGNOSIS — E7800 Pure hypercholesterolemia, unspecified: Secondary | ICD-10-CM | POA: Diagnosis not present

## 2020-05-10 DIAGNOSIS — I10 Essential (primary) hypertension: Secondary | ICD-10-CM | POA: Diagnosis not present

## 2020-05-10 DIAGNOSIS — N3001 Acute cystitis with hematuria: Secondary | ICD-10-CM | POA: Diagnosis not present

## 2020-05-10 DIAGNOSIS — R309 Painful micturition, unspecified: Secondary | ICD-10-CM | POA: Diagnosis not present

## 2020-05-10 DIAGNOSIS — R8281 Pyuria: Secondary | ICD-10-CM | POA: Diagnosis not present

## 2020-05-10 LAB — URINALYSIS, ROUTINE W REFLEX MICROSCOPIC
Bilirubin, UA: NEGATIVE
Glucose, UA: NEGATIVE
Ketones, UA: NEGATIVE
Nitrite, UA: POSITIVE — AB
Protein,UA: NEGATIVE
Specific Gravity, UA: 1.01 (ref 1.005–1.030)
Urobilinogen, Ur: 0.2 mg/dL (ref 0.2–1.0)
pH, UA: 5.5 (ref 5.0–7.5)

## 2020-05-10 LAB — MICROSCOPIC EXAMINATION: WBC, UA: >30 /hpf — AB (ref 0–5)

## 2020-05-10 MED ORDER — DOXYCYCLINE HYCLATE 100 MG PO TABS
100.0000 mg | ORAL_TABLET | Freq: Two times a day (BID) | ORAL | 0 refills | Status: DC
Start: 2020-05-10 — End: 2020-12-22

## 2020-05-10 MED ORDER — FLUCONAZOLE 150 MG PO TABS
150.0000 mg | ORAL_TABLET | Freq: Once | ORAL | 0 refills | Status: AC
Start: 2020-05-10 — End: 2020-05-10

## 2020-05-10 MED ORDER — GABAPENTIN 300 MG PO CAPS: 300.0000 mg | ORAL_CAPSULE | Freq: Two times a day (BID) | ORAL | 4 refills | Status: DC

## 2020-05-10 MED ORDER — AMOXICILLIN-POT CLAVULANATE 875-125 MG PO TABS: 1.0000 | ORAL_TABLET | Freq: Two times a day (BID) | ORAL | 0 refills | Status: DC

## 2020-05-10 NOTE — Assessment & Plan Note (Addendum)
Chronic, poor tolerance to statins.  Had trial of Lipitor and Crestor with immediate muscle cramps.  Will consider injectable such as Repatha if ongoing elevations.  Discussed at length with patient.  Lipid panel today.

## 2020-05-10 NOTE — Assessment & Plan Note (Signed)
Recommended eating smaller high protein, low fat meals more frequently and exercising 30 mins a day 5 times a week with a goal of 10-15lb weight loss in the next 3 months. Patient voiced their understanding and motivation to adhere to these recommendations.  

## 2020-05-10 NOTE — Telephone Encounter (Signed)
Pt called stating that the amoxicillin that she was prescribed today she is not able to take, because it makes her vomit. Pt states that she has already paid for this medication and that she is "upset that the provider did not look at her chart to see she could not take the medication". Pt is requesting to have another medication in for her. Please advise.       [place], [place] - [place] [address]  [address] [place] [address]  Phone: [phone] Fax: [phone]  Hours: Not open 24 hours

## 2020-05-10 NOTE — Assessment & Plan Note (Signed)
Chronic, stable on current regimen with PHQ9 = 4.  Denies SI/HI.  Continue current medication regimen and adjust as needed.  Return in 6 months for annual physical.

## 2020-05-10 NOTE — Assessment & Plan Note (Addendum)
UA noting 1+ BLD, positive NIT, trace Leu, and many bacteria.  Due to age will treat with Augmentin, script sent, and adjust abx regimen based on culture.  Also sent in x one dose Diflucan due to vaginal itching, she refuses to perform wet prep. Recommend increased water hydration at home and may take Azo for comfort. Return to office for worsening or continued symptoms.  If ongoing recurrence could consider adding on vaginal estrogen cream, ? Frequent UTI related to vaginal atrophy.

## 2020-05-10 NOTE — Progress Notes (Signed)
BP 124/81   Pulse 85   Temp 98.1 F (36.7 C) (Oral)   Wt 175 lb 9.6 oz (79.7 kg)   SpO2 96%   BMI 31.30 kg/m    Subjective:    Patient ID: Angela Martinez, female    DOB: Oct 27, 1951, 69 y.o.   MRN: [id number]  HPI: Angela Martinez is a 69 y.o. female  Chief Complaint  Patient presents with  . Depression  . Hypertension  . Urinary Tract Infection    pt states she has been having vaginal itching and burning. States she has been taking AZO   HYPERTENSION / HYPERLIPIDEMIA No current medications.  Has muscle pains with Lipitor, even on 3 day schedule.  Was switched to Crestor last visit in June 2020 and took three times a week, but immediately started with muscle pain 3-4 days after taking.  Discussed possible use of injectables, like Repatha, in future for HLD due to ongoing side effects with statin therapy. She reports interest in this in future if needed, wishes to focus on diet at this time. Satisfied with current treatment?no Duration of hypertension:chronic BP monitoring frequency:rarely BP range:120-130/80's BP medication side effects:no Duration of hyperlipidemia:chronic Cholesterol medication side effects:no Cholesterol supplements: none Medication compliance:good compliance Aspirin:no Recent stressors:no Recurrent headaches:no Visual changes:no Palpitations:no Dyspnea:no Chest pain:no Lower extremity edema:no Dizzy/lightheaded:no The 10-year ASCVD risk score Mikey Bussing DC Jr., et al., 2013) is: 10.4%   Values used to calculate the score:     Age: 77 years     Sex: Female     Is Non-Hispanic African American: No     Diabetic: No     Tobacco smoker: No     Systolic Blood Pressure: 625 mmHg     Is BP treated: No     HDL Cholesterol: 50 mg/dL     Total Cholesterol: 225 mg/dL   URINARY SYMPTOMS Started with symptoms 2 weeks ago, felt nauseated and upset stomach.  Took Azo, without much benefit.  Does endorse occasional constipation.  States having some  vaginal itching and dysuria, refuses to do a wet prep today.    Last treated in April 2021 with Augmentin. Dysuria: burning Urinary frequency: yes Urgency: yes Small volume voids: no Symptom severity: yes Urinary incontinence: no Foul odor: yes Hematuria: no Abdominal pain: yes Back pain: no Suprapubic pain/pressure: yes Flank pain: no Fever:  no Vomiting: nausea only Relief with cranberry juice: no Relief with pyridium: no Status: stable Previous urinary tract infection: yes Recurrent urinary tract infection: no Sexual activity: No sexually active Treatments attempted: pyridium and increasing fluids   DEPRESSION Continues on Prozac 20 MG daily. Mood status: stable Satisfied with current treatment?: yes Symptom severity: mild  Duration of current treatment : chronic Side effects: no Medication compliance: good compliance Psychotherapy/counseling: none Previous psychiatric medications: Prozac Depressed mood: no Anxious mood: no Anhedonia: no Significant weight loss or gain: no Insomnia: none Fatigue: no Feelings of worthlessness or guilt: no Impaired concentration/indecisiveness: no Suicidal ideations: no Hopelessness: no Crying spells: no Depression screen [facility] 2/9 05/10/2020 10/15/2019 09/02/2018 08/02/2018 07/26/2018  Decreased Interest 1 0 0 0 0  Down, Depressed, Hopeless 1 1 1  0 0  PHQ - 2 Score 2 1 1  0 0  Altered sleeping 1 0 0 0 -  Tired, decreased energy 1 1 1  0 -  Change in appetite 0 3 0 0 -  Feeling bad or failure about yourself  0 1 0 0 -  Trouble concentrating 0 0 0 0 -  Moving slowly or fidgety/restless 0 0 0 0 -  Suicidal thoughts 0 0 0 0 -  PHQ-9 Score 4 6 2  0 -  Difficult doing work/chores Somewhat difficult Not difficult at all Not difficult at all Not difficult at all -    Relevant past medical, surgical, family and social history reviewed and updated as indicated. Interim medical history since our last visit reviewed. Allergies and  medications reviewed and updated.  Review of Systems  Constitutional: Negative for activity change, appetite change, diaphoresis, fatigue and fever.  Respiratory: Negative for cough, chest tightness and shortness of breath.   Cardiovascular: Negative for chest pain, palpitations and leg swelling.  Gastrointestinal: Negative.   Neurological: Negative.   Psychiatric/Behavioral: Negative.     Per HPI unless specifically indicated above     Objective:    BP 124/81   Pulse 85   Temp 98.1 F (36.7 C) (Oral)   Wt 175 lb 9.6 oz (79.7 kg)   SpO2 96%   BMI 31.30 kg/m   Wt Readings from Last 3 Encounters:  05/10/20 175 lb 9.6 oz (79.7 kg)  03/05/20 175 lb 3.2 oz (79.5 kg)  10/15/19 173 lb (78.5 kg)    Physical Exam Vitals and nursing note reviewed.  Constitutional:      General: She is awake. She is not in acute distress.    Appearance: She is well-developed and well-groomed. She is obese. She is not ill-appearing.  HENT:     Head: Normocephalic.     Right Ear: Hearing normal.     Left Ear: Hearing normal.  Eyes:     General: Lids are normal.        Right eye: No discharge.        Left eye: No discharge.     Conjunctiva/sclera: Conjunctivae normal.     Pupils: Pupils are equal, round, and reactive to light.  Neck:     Thyroid: No thyromegaly.     Vascular: No carotid bruit.  Cardiovascular:     Rate and Rhythm: Normal rate and regular rhythm.     Heart sounds: Normal heart sounds. No murmur heard.  No gallop.   Pulmonary:     Effort: Pulmonary effort is normal. No accessory muscle usage or respiratory distress.     Breath sounds: Normal breath sounds.  Abdominal:     General: Bowel sounds are normal.     Palpations: Abdomen is soft.     Tenderness: There is no abdominal tenderness. There is no right CVA tenderness or left CVA tenderness.  Musculoskeletal:     Cervical back: Normal range of motion and neck supple.     Right lower leg: No edema.     Left lower leg: No  edema.  Skin:    General: Skin is warm and dry.  Neurological:     Mental Status: She is alert and oriented to person, place, and time.  Psychiatric:        Attention and Perception: Attention normal.        Mood and Affect: Mood normal.        Speech: Speech normal.        Behavior: Behavior normal. Behavior is cooperative.        Thought Content: Thought content normal.     Results for orders placed or performed in visit on 03/05/20  Microscopic Examination   URINE  Result Value Ref Range   WBC, UA 0-5 0 - 5 /hpf   RBC None seen 0 -  2 /hpf   Epithelial Cells (non renal) 0-10 0 - 10 /hpf   Bacteria, UA Many (A) None seen/Few  Urine Culture, Reflex   URINE  Result Value Ref Range   Urine Culture, Routine Final report (A)    Organism ID, Bacteria Comment (A)    Antimicrobial Susceptibility Comment   UA/M w/rflx Culture, Routine   Specimen: Urine   URINE  Result Value Ref Range   Specific Gravity, UA 1.015 1.005 - 1.030   pH, UA 5.5 5.0 - 7.5   Color, UA Yellow Yellow   Appearance Ur Clear Clear   Leukocytes,UA Trace (A) Negative   Protein,UA Negative Negative/Trace   Glucose, UA Negative Negative   Ketones, UA Negative Negative   RBC, UA Negative Negative   Bilirubin, UA Negative Negative   Urobilinogen, Ur 0.2 0.2 - 1.0 mg/dL   Nitrite, UA Positive (A) Negative   Microscopic Examination See below:    Urinalysis Reflex Comment       Assessment & Plan:   Problem List Items Addressed This Visit      Cardiovascular and Mediastinum   Essential hypertension - Primary    Chronic, stable with no medications.  Continue current diet control and adjust plan as needed based on readings.  Continue to check BP at home regularly and document.  Initiate medication as needed.      Relevant Orders   Basic metabolic panel     Genitourinary   UTI (urinary tract infection)    UA noting 1+ BLD, positive NIT, trace Leu, and many bacteria.  Due to age will treat with Augmentin,  script sent, and adjust abx regimen based on culture.  Also sent in x one dose Diflucan due to vaginal itching, she refuses to perform wet prep. Recommend increased water hydration at home and may take Azo for comfort. Return to office for worsening or continued symptoms.  If ongoing recurrence could consider adding on vaginal estrogen cream, ? Frequent UTI related to vaginal atrophy.      Relevant Medications   fluconazole (DIFLUCAN) 150 MG tablet   Other Relevant Orders   Urinalysis, Routine w reflex microscopic   Urine Culture     Other   Hyperlipidemia    Chronic, poor tolerance to statins.  Had trial of Lipitor and Crestor with immediate muscle cramps.  Will consider injectable such as Repatha if ongoing elevations.  Discussed at length with patient.  Lipid panel today.      Relevant Orders   Lipid Panel w/o Chol/HDL Ratio   Obesity    Recommended eating smaller high protein, low fat meals more frequently and exercising 30 mins a day 5 times a week with a goal of 10-15lb weight loss in the next 3 months. Patient voiced their understanding and motivation to adhere to these recommendations.       Depression    Chronic, stable on current regimen with PHQ9 = 4.  Denies SI/HI.  Continue current medication regimen and adjust as needed.  Return in 6 months for annual physical.      Relevant Medications   gabapentin (NEURONTIN) 300 MG capsule       Follow up plan: Return in about 6 months (around 11/09/2020) for Annual physical.

## 2020-05-10 NOTE — Telephone Encounter (Signed)
Pt.notified

## 2020-05-10 NOTE — Telephone Encounter (Signed)
Sent in Doxycycline and let her know Augmentin was not on her allergy list, but I have added this.  She took this in April for UTI.

## 2020-05-10 NOTE — Patient Instructions (Signed)
Urinary Tract Infection, Adult A urinary tract infection (UTI) is an infection of any part of the urinary tract. The urinary tract includes:  The kidneys.  The ureters.  The bladder.  The urethra. These organs make, store, and get rid of pee (urine) in the body. What are the causes? This is caused by germs (bacteria) in your genital area. These germs grow and cause swelling (inflammation) of your urinary tract. What increases the risk? You are more likely to develop this condition if:  You have a small, thin tube (catheter) to drain pee.  You cannot control when you pee or poop (incontinence).  You are female, and: ? You use these methods to prevent pregnancy:  A medicine that kills sperm (spermicide).  A device that blocks sperm (diaphragm). ? You have low levels of a female hormone (estrogen). ? You are pregnant.  You have genes that add to your risk.  You are sexually active.  You take antibiotic medicines.  You have trouble peeing because of: ? A prostate that is bigger than normal, if you are female. ? A blockage in the part of your body that drains pee from the bladder (urethra). ? A kidney stone. ? A nerve condition that affects your bladder (neurogenic bladder). ? Not getting enough to drink. ? Not peeing often enough.  You have other conditions, such as: ? Diabetes. ? A weak disease-fighting system (immune system). ? Sickle cell disease. ? Gout. ? Injury of the spine. What are the signs or symptoms? Symptoms of this condition include:  Needing to pee right away (urgently).  Peeing often.  Peeing small amounts often.  Pain or burning when peeing.  Blood in the pee.  Pee that smells bad or not like normal.  Trouble peeing.  Pee that is cloudy.  Fluid coming from the vagina, if you are female.  Pain in the belly or lower back. Other symptoms include:  Throwing up (vomiting).  No urge to eat.  Feeling mixed up (confused).  Being tired  and grouchy (irritable).  A fever.  Watery poop (diarrhea). How is this treated? This condition may be treated with:  Antibiotic medicine.  Other medicines.  Drinking enough water. Follow these instructions at home:  Medicines  Take over-the-counter and prescription medicines only as told by your doctor.  If you were prescribed an antibiotic medicine, take it as told by your doctor. Do not stop taking it even if you start to feel better. General instructions  Make sure you: ? Pee until your bladder is empty. ? Do not hold pee for a long time. ? Empty your bladder after sex. ? Wipe from front to back after pooping if you are a female. Use each tissue one time when you wipe.  Drink enough fluid to keep your pee pale yellow.  Keep all follow-up visits as told by your doctor. This is important. Contact a doctor if:  You do not get better after 1-2 days.  Your symptoms go away and then come back. Get help right away if:  You have very bad back pain.  You have very bad pain in your lower belly.  You have a fever.  You are sick to your stomach (nauseous).  You are throwing up. Summary  A urinary tract infection (UTI) is an infection of any part of the urinary tract.  This condition is caused by germs in your genital area.  There are many risk factors for a UTI. These include having a small, thin   tube to drain pee and not being able to control when you pee or poop.  Treatment includes antibiotic medicines for germs.  Drink enough fluid to keep your pee pale yellow. This information is not intended to replace advice given to you by your health care provider. Make sure you discuss any questions you have with your health care provider. Document Revised: 10/24/2018 Document Reviewed: 05/16/2018 Elsevier Patient Education  2020 [employer]vier Inc.  

## 2020-05-10 NOTE — Assessment & Plan Note (Signed)
Chronic, stable with no medications.  Continue current diet control and adjust plan as needed based on readings.  Continue to check BP at home regularly and document.  Initiate medication as needed.

## 2020-05-11 LAB — LIPID PANEL W/O CHOL/HDL RATIO
Cholesterol, Total: 231 mg/dL — ABNORMAL HIGH (ref 100–199)
HDL: 47 mg/dL (ref >39)
LDL Chol Calc (NIH): 159 mg/dL — ABNORMAL HIGH (ref 0–99)
Triglycerides: 138 mg/dL (ref 0–149)
VLDL Cholesterol Cal: 25 mg/dL (ref 5–40)

## 2020-05-11 LAB — BASIC METABOLIC PANEL
BUN/Creatinine Ratio: 18 (ref 12–28)
BUN: 17 mg/dL (ref 8–27)
CO2: 24 mmol/L (ref 20–29)
Calcium: 9.5 mg/dL (ref 8.7–10.3)
Chloride: 105 mmol/L (ref 96–106)
Creatinine, Ser: 0.95 mg/dL (ref 0.57–1.00)
GFR calc Af Amer: 70 mL/min/{1.73_m2} (ref >59)
GFR calc non Af Amer: 60 mL/min/{1.73_m2} (ref >59)
Glucose: 90 mg/dL (ref 65–99)
Potassium: 4.7 mmol/L (ref 3.5–5.2)
Sodium: 142 mmol/L (ref 134–144)

## 2020-05-11 NOTE — Progress Notes (Signed)
Please let Angela Martinez know her labs continue to show normal kidney function and electrolytes.  Cholesterol levels continue to be elevated, but I know in past she has not tolerated statins.  We will continue to assess and discuss other options at further visits.  Have a great day!!

## 2020-05-12 LAB — URINE CULTURE

## 2020-05-20 ENCOUNTER — Ambulatory Visit: Payer: Self-pay

## 2020-05-20 ENCOUNTER — Telehealth: Payer: Self-pay | Admitting: Family Medicine

## 2020-05-20 NOTE — Telephone Encounter (Signed)
Pt is having issues with new medication that was prescribed to her .   doxycycline (VIBRA-TABS) 100 MG tablet [[id number]]   Pt states medication makes her throw up an she isn't able to continue taking it. Please advise . Nurse Triage was called as well

## 2020-05-20 NOTE — Telephone Encounter (Signed)
Pt. Reports the Doxycycline started causing vomiting, gas and abdominal pain. Has 3-4 tabs left. States "I even tried probiotic yogurt, and it didn't help." Please advise pt.  Answer Assessment - Initial Assessment Questions 1. NAME of MEDICATION: "What medicine are you calling about?"     Doxycyline 2. QUESTION: "What is your question?" (e.g., medication refill, side effect)     Causing vomiting, gas and abdominal pain 3. PRESCRIBING HCP: "Who prescribed it?" Reason: if prescribed by specialist, call should be referred to that group.     Cannady 4. SYMPTOMS: "Do you have any symptoms?"     Yes 5. SEVERITY: If symptoms are present, ask "Are they mild, moderate or severe?"     Severe 6. PREGNANCY:  "Is there any chance that you are pregnant?" "When was your last menstrual period?"     No  Protocols used: MEDICATION QUESTION CALL-A-AH

## 2020-05-20 NOTE — Telephone Encounter (Signed)
Please advise patient to stop taking medication.  See if she is still having symptoms, if so I will figure alternative medication as she has multiple allergies and has had nausea with multiple antibiotics.  If no symptoms then tell her to continue to hydrate well and if return of symptoms notify provider immediately.

## 2020-05-20 NOTE — Telephone Encounter (Signed)
Sent message to clinical pool.:)

## 2020-05-21 ENCOUNTER — Other Ambulatory Visit: Payer: Self-pay | Admitting: Nurse Practitioner

## 2020-05-21 DIAGNOSIS — R112 Nausea with vomiting, unspecified: Secondary | ICD-10-CM

## 2020-05-21 NOTE — Progress Notes (Signed)
GI referral per patient request

## 2020-05-21 NOTE — Telephone Encounter (Signed)
I have placed a referral to GI for her, however if she is still have symptoms I do recommend she return to office for further evaluation.  Thank you.

## 2020-05-21 NOTE — Telephone Encounter (Signed)
Called and spoke with patient, advised her of Jolene's message. Patient states she would like to inquire about going to gastroenterology. States that before she even started the doxycyline, she was having some issues with nausea, vomiting, and gas. She states that she feels like the doxycycline just made these symptoms worse.

## 2020-05-21 NOTE — Telephone Encounter (Signed)
Patient notified and verbalized understanding. 

## 2020-05-25 ENCOUNTER — Ambulatory Visit
Admission: RE | Admit: 2020-05-25 | Discharge: 2020-05-25 | Disposition: A | Discharge status: Home / Self Care | Payer: PPO | Source: Ambulatory Visit | Attending: Nurse Practitioner | Admitting: Nurse Practitioner

## 2020-05-25 DIAGNOSIS — M810 Age-related osteoporosis without current pathological fracture: Secondary | ICD-10-CM | POA: Diagnosis not present

## 2020-05-25 DIAGNOSIS — Z1231 Encounter for screening mammogram for malignant neoplasm of breast: Secondary | ICD-10-CM | POA: Diagnosis not present

## 2020-05-25 DIAGNOSIS — Z13820 Encounter for screening for osteoporosis: Secondary | ICD-10-CM | POA: Diagnosis not present

## 2020-05-27 NOTE — Progress Notes (Signed)
Please let Junice know that DEXA scan did return showing ongoing osteoporosis, but compared to scan in 2017 numbers have improved some.  Continue supplements at this time and next visit her and I can sit an chat about other treatments that may be beneficial.  Have a great day!!!

## 2020-08-02 ENCOUNTER — Telehealth: Payer: Self-pay | Admitting: Nurse Practitioner

## 2020-08-02 NOTE — Telephone Encounter (Signed)
Copied from [place] [id number]. Topic: Medicare AWV >> Aug 02, 2020 11:47 AM Cher Nakai R wrote: Reason for CRM:  No answer unable to leave message for patient to call back and schedule the Medicare Annual Wellness Visit (AWV) virtually.  Last AWV 07/26/2018  Please schedule at anytime with [facility]-Nurse Health Advisor.  45 minute appointment  Any questions, please call me at [phone]

## 2020-08-19 ENCOUNTER — Encounter: Payer: Self-pay | Admitting: Unknown Physician Specialty

## 2020-08-19 ENCOUNTER — Ambulatory Visit
Admission: RE | Admit: 2020-08-19 | Discharge: 2020-08-19 | Disposition: A | Discharge status: Home / Self Care | Payer: PPO | Attending: Unknown Physician Specialty | Admitting: Unknown Physician Specialty

## 2020-08-19 ENCOUNTER — Ambulatory Visit (INDEPENDENT_AMBULATORY_CARE_PROVIDER_SITE_OTHER): Payer: PPO | Admitting: Gastroenterology

## 2020-08-19 ENCOUNTER — Ambulatory Visit (INDEPENDENT_AMBULATORY_CARE_PROVIDER_SITE_OTHER): Payer: PPO | Admitting: Unknown Physician Specialty

## 2020-08-19 ENCOUNTER — Ambulatory Visit
Admission: RE | Admit: 2020-08-19 | Discharge: 2020-08-19 | Disposition: A | Discharge status: Home / Self Care | Payer: PPO | Source: Ambulatory Visit | Attending: Unknown Physician Specialty | Admitting: Unknown Physician Specialty

## 2020-08-19 ENCOUNTER — Other Ambulatory Visit: Payer: Self-pay

## 2020-08-19 VITALS — BP 122/76 | HR 79 | Temp 98.3°F | Ht 64.0 in | Wt 174.8 lb

## 2020-08-19 VITALS — BP 131/77 | HR 77 | Temp 98.2°F | Ht 62.0 in | Wt 175.0 lb

## 2020-08-19 DIAGNOSIS — Z538 Procedure and treatment not carried out for other reasons: Secondary | ICD-10-CM

## 2020-08-19 DIAGNOSIS — G8929 Other chronic pain: Secondary | ICD-10-CM | POA: Insufficient documentation

## 2020-08-19 DIAGNOSIS — M25531 Pain in right wrist: Secondary | ICD-10-CM

## 2020-08-19 DIAGNOSIS — R300 Dysuria: Secondary | ICD-10-CM | POA: Diagnosis not present

## 2020-08-19 DIAGNOSIS — M19032 Primary osteoarthritis, left wrist: Secondary | ICD-10-CM | POA: Diagnosis not present

## 2020-08-19 MED ORDER — NITROFURANTOIN MONOHYD MACRO 100 MG PO CAPS: 100.0000 mg | ORAL_CAPSULE | Freq: Two times a day (BID) | ORAL | 0 refills | Status: DC

## 2020-08-19 NOTE — Progress Notes (Signed)
BP 122/76 (BP Location: Left Arm, Patient Position: Sitting, Cuff Size: Normal)   Pulse 79   Temp 98.3 F (36.8 C) (Oral)   Ht 5\' 4"  (1.626 m)   Wt 174 lb 12.8 oz (79.3 kg)   SpO2 99%   BMI 30.00 kg/m    Subjective:    Patient ID: Angela Martinez, female    DOB: Dec 24, 1950, 69 y.o.   MRN: [id number]  HPI: Angela Martinez is a 69 y.o. female  Chief Complaint  Patient presents with  . Urinary Tract Infection  . Wrist Pain   Dysuria  This is a recurrent (Pt states this is the 4th time this year she has had this.  ) problem. Episode onset: 1 month. The problem occurs every urination. The problem has been gradually worsening. There has been no fever. She is not sexually active. There is no history of pyelonephritis. Associated symptoms include frequency and urgency. Pertinent negatives include no chills, discharge, flank pain, hematuria, hesitancy, nausea, possible pregnancy, sweats or vomiting. Treatments tried: drinking a lot of ginger ale.    Wrist Pain  The pain is present in the left wrist. This is a new problem. Episode onset: 1 month. There has been no history of extremity trauma. The problem occurs daily (Points to the lateral aspect and hurts if pushes down with an open hand). The problem has been unchanged. The quality of the pain is described as aching. Pertinent negatives include no fever, inability to bear weight, itching, joint locking, joint swelling, limited range of motion, numbness, stiffness or tingling. She has tried nothing for the symptoms.    Relevant past medical, surgical, family and social history reviewed and updated as indicated. Interim medical history since our last visit reviewed. Allergies and medications reviewed and updated.  Review of Systems  Constitutional: Negative for chills and fever.  Gastrointestinal: Negative for nausea and vomiting.  Genitourinary: Positive for dysuria, frequency and urgency. Negative for flank pain, hematuria and hesitancy.    Musculoskeletal: Negative for stiffness.  Skin: Negative for itching.  Neurological: Negative for tingling and numbness.    Per HPI unless specifically indicated above     Objective:    BP 122/76 (BP Location: Left Arm, Patient Position: Sitting, Cuff Size: Normal)   Pulse 79   Temp 98.3 F (36.8 C) (Oral)   Ht 5\' 4"  (1.626 m)   Wt 174 lb 12.8 oz (79.3 kg)   SpO2 99%   BMI 30.00 kg/m   Wt Readings from Last 3 Encounters:  08/19/20 174 lb 12.8 oz (79.3 kg)  05/10/20 175 lb 9.6 oz (79.7 kg)  03/05/20 175 lb 3.2 oz (79.5 kg)    Physical Exam Constitutional:      General: She is not in acute distress.    Appearance: Normal appearance. She is well-developed.  HENT:     Head: Normocephalic and atraumatic.  Eyes:     General: Lids are normal. No scleral icterus.       Right eye: No discharge.        Left eye: No discharge.     Conjunctiva/sclera: Conjunctivae normal.  Neck:     Vascular: No carotid bruit or JVD.  Cardiovascular:     Rate and Rhythm: Normal rate and regular rhythm.     Heart sounds: Normal heart sounds.  Pulmonary:     Effort: Pulmonary effort is normal.     Breath sounds: Normal breath sounds.  Abdominal:     General: Abdomen is flat.  Palpations: Abdomen is soft. There is no hepatomegaly or splenomegaly.     Tenderness: There is no abdominal tenderness. There is no right CVA tenderness or left CVA tenderness.  Musculoskeletal:        General: Normal range of motion.     Right wrist: Normal.     Left wrist: Swelling and tenderness present. No effusion, lacerations, snuff box tenderness or crepitus. Normal range of motion. Normal pulse.     Cervical back: Normal range of motion and neck supple.     Comments: Chronic swelling left lateral wrist.  Tender lateral wrist  Skin:    General: Skin is warm and dry.     Coloration: Skin is not pale.     Findings: No rash.  Neurological:     Mental Status: She is alert and oriented to person, place, and  time.  Psychiatric:        Behavior: Behavior normal.        Thought Content: Thought content normal.        Judgment: Judgment normal.    Urine positive for Nitrates, Leukocytes, bacteria.    Past urine C&S showing ecoli with god sensitivity to all antibiotics.  Allergic to sulfa      Assessment & Plan:   Problem List Items Addressed This Visit    None    Visit Diagnoses    Dysuria    -  Primary   Chart review shows typically receives Augmentin.  Will get a culture but try Macrobid BID for 10 days.  Pt ed on fluids.  Cranberry   Relevant Orders   UA/M w/rflx Culture, Routine (STAT)   Wrist pain, chronic, right       Pt with significant left wrist pain with certain motions.  UsevVoltaren cream she has at home.  Will get x-Radabaugh for further evaluation.    Relevant Orders   DG Wrist Complete Left      Consider referral to urology for continued reoccurance of UTI.    Follow up plan: Return if symptoms worsen or fail to improve.

## 2020-08-19 NOTE — Progress Notes (Signed)
Patient cancelled appointment since has no symptoms presently

## 2020-08-23 LAB — UA/M W/RFLX CULTURE, ROUTINE
Bilirubin, UA: NEGATIVE
Glucose, UA: NEGATIVE
Ketones, UA: NEGATIVE
Nitrite, UA: POSITIVE — AB
Protein,UA: NEGATIVE
RBC, UA: NEGATIVE
Specific Gravity, UA: 1.01 (ref 1.005–1.030)
Urobilinogen, Ur: 0.2 mg/dL (ref 0.2–1.0)
pH, UA: 5 (ref 5.0–7.5)

## 2020-08-23 LAB — MICROSCOPIC EXAMINATION: RBC, Urine: NONE SEEN /hpf (ref 0–2)

## 2020-08-23 LAB — URINE CULTURE, REFLEX

## 2020-09-02 DIAGNOSIS — Z961 Presence of intraocular lens: Secondary | ICD-10-CM | POA: Diagnosis not present

## 2020-11-25 ENCOUNTER — Encounter: Payer: PPO | Admitting: Nurse Practitioner

## 2020-12-17 ENCOUNTER — Telehealth: Payer: Self-pay | Admitting: Nurse Practitioner

## 2020-12-17 ENCOUNTER — Other Ambulatory Visit: Payer: Self-pay | Admitting: Nurse Practitioner

## 2020-12-17 MED ORDER — VALACYCLOVIR HCL 500 MG PO TABS: 500.0000 mg | ORAL_TABLET | ORAL | 4 refills | Status: DC | PRN

## 2020-12-17 NOTE — Telephone Encounter (Signed)
Medication Refill - Medication: valtrex   Has the patient contacted their pharmacy? No. (Agent: If no, request that the patient contact the pharmacy for the refill.) (Agent: If yes, when and what did the pharmacy advise?)  Preferred Pharmacy (with phone number or street name): [place]  Agent: Please be advised that RX refills may take up to 3 business days. We ask that you follow-up with your pharmacy.

## 2020-12-17 NOTE — Telephone Encounter (Signed)
Patient notified

## 2020-12-17 NOTE — Telephone Encounter (Signed)
Refill sent in

## 2020-12-20 ENCOUNTER — Telehealth: Payer: Self-pay | Admitting: *Deleted

## 2020-12-20 NOTE — Chronic Care Management (AMB) (Signed)
  Chronic Care Management   Note  12/20/2020 Name: Angela Martinez MRN: [id number] DOB: [birth year]  Angela Martinez is a 70 y.o. year old female who is a primary care patient of Cannady, Barbaraann Faster, NP. I reached out to [employer] by phone today in response to a referral sent by [name]'s health plan.     [name] was given information about Chronic Care Management services today including:  1. CCM service includes personalized support from designated clinical staff supervised by her physician, including individualized plan of care and coordination with other care providers 2. 24/7 contact phone numbers for assistance for urgent and routine care needs. 3. Service will only be billed when office clinical staff spend 20 minutes or more in a month to coordinate care. 4. Only one practitioner may furnish and bill the service in a calendar month. 5. The patient may stop CCM services at any time (effective at the end of the month) by phone call to the office staff. 6. The patient will be responsible for cost sharing (co-pay) of up to 20% of the service fee (after annual deductible is met).  Patient wishes to consider information provided and/or speak with a member of the care team before deciding about enrollment in care management services.   [facility]

## 2020-12-22 ENCOUNTER — Ambulatory Visit (INDEPENDENT_AMBULATORY_CARE_PROVIDER_SITE_OTHER): Payer: PPO | Admitting: Nurse Practitioner

## 2020-12-22 ENCOUNTER — Encounter: Payer: Self-pay | Admitting: Nurse Practitioner

## 2020-12-22 ENCOUNTER — Other Ambulatory Visit: Payer: Self-pay

## 2020-12-22 VITALS — BP 130/76 | HR 72 | Temp 98.3°F | Ht 62.24 in | Wt 173.4 lb

## 2020-12-22 DIAGNOSIS — Z6831 Body mass index (BMI) 31.0-31.9, adult: Secondary | ICD-10-CM | POA: Diagnosis not present

## 2020-12-22 DIAGNOSIS — I10 Essential (primary) hypertension: Secondary | ICD-10-CM | POA: Diagnosis not present

## 2020-12-22 DIAGNOSIS — E7800 Pure hypercholesterolemia, unspecified: Secondary | ICD-10-CM

## 2020-12-22 DIAGNOSIS — M797 Fibromyalgia: Secondary | ICD-10-CM

## 2020-12-22 DIAGNOSIS — F320 Major depressive disorder, single episode, mild: Secondary | ICD-10-CM | POA: Diagnosis not present

## 2020-12-22 DIAGNOSIS — K219 Gastro-esophageal reflux disease without esophagitis: Secondary | ICD-10-CM

## 2020-12-22 DIAGNOSIS — E559 Vitamin D deficiency, unspecified: Secondary | ICD-10-CM | POA: Diagnosis not present

## 2020-12-22 DIAGNOSIS — T466X5A Adverse effect of antihyperlipidemic and antiarteriosclerotic drugs, initial encounter: Secondary | ICD-10-CM | POA: Diagnosis not present

## 2020-12-22 DIAGNOSIS — E6609 Other obesity due to excess calories: Secondary | ICD-10-CM | POA: Diagnosis not present

## 2020-12-22 DIAGNOSIS — M791 Myalgia, unspecified site: Secondary | ICD-10-CM | POA: Diagnosis not present

## 2020-12-22 DIAGNOSIS — M810 Age-related osteoporosis without current pathological fracture: Secondary | ICD-10-CM | POA: Diagnosis not present

## 2020-12-22 DIAGNOSIS — Z0000 Encounter for general adult medical examination without abnormal findings: Secondary | ICD-10-CM

## 2020-12-22 LAB — URINALYSIS, ROUTINE W REFLEX MICROSCOPIC
Bilirubin, UA: NEGATIVE
Glucose, UA: NEGATIVE
Ketones, UA: NEGATIVE
Nitrite, UA: NEGATIVE
Protein,UA: NEGATIVE
RBC, UA: NEGATIVE
Specific Gravity, UA: 1.02 (ref 1.005–1.030)
Urobilinogen, Ur: 0.2 mg/dL (ref 0.2–1.0)
pH, UA: 5 (ref 5.0–7.5)

## 2020-12-22 LAB — MICROSCOPIC EXAMINATION: Bacteria, UA: NONE SEEN

## 2020-12-22 MED ORDER — FLUOXETINE HCL 20 MG PO CAPS: ORAL_CAPSULE | ORAL | 4 refills | Status: DC

## 2020-12-22 MED ORDER — OMEPRAZOLE 20 MG PO CPDR: 20.0000 mg | DELAYED_RELEASE_CAPSULE | Freq: Every day | ORAL | 4 refills | Status: DC

## 2020-12-22 MED ORDER — GABAPENTIN 300 MG PO CAPS: 300.0000 mg | ORAL_CAPSULE | Freq: Two times a day (BID) | ORAL | 4 refills | Status: DC

## 2020-12-22 NOTE — Assessment & Plan Note (Signed)
Chronic, stable on Gabapentin.  Continue current medication and adjust as needed.  Return in 6 months.  Monitor kidney function and renal dose Gabapentin as needed. 

## 2020-12-22 NOTE — Assessment & Plan Note (Signed)
BMI 31.47.  Recommended eating smaller high protein, low fat meals more frequently and exercising 30 mins a day 5 times a week with a goal of 10-15lb weight loss in the next 3 months. Patient voiced their understanding and motivation to adhere to these recommendations.

## 2020-12-22 NOTE — Patient Instructions (Addendum)
Alendronate Oral Tablets What is this medicine? ALENDRONATE (a LEN droe nate) slows calcium loss from bones. It treats Paget's disease and osteoporosis. It may be used in other people at risk for bone loss. This medicine may be used for other purposes; ask your health care provider or pharmacist if you have questions. COMMON BRAND NAME(S): Fosamax What should I tell my health care provider before I take this medicine? They need to know if you have any of these conditions:  bleeding disorder  cancer  dental disease  difficulty swallowing  infection (fever, chills, cough, sore throat, pain or trouble passing urine)  kidney disease  low levels of calcium or other minerals in the blood  low red blood cell counts  receiving steroids like dexamethasone or prednisone  stomach or intestine problems  trouble sitting or standing for 30 minutes  an unusual or allergic reaction to alendronate, other drugs, foods, dyes or preservatives  pregnant or trying to get pregnant  breast-feeding How should I use this medicine? Take this drug by mouth with a full glass of water. Take it as directed on the prescription label at the same time every day. Take the dose right after waking up. Do not eat or drink anything before taking it. Do not take it with any other drink except water. Do not chew or crush the tablet. After taking it, do not eat breakfast, drink, or take any other drugs or vitamins for at least 30 minutes. Sit or stand up for at least 30 minutes after you take it. Do not lie down. Keep taking it unless your health care provider tells you to stop. A special MedGuide will be given to you by the pharmacist with each prescription and refill. Be sure to read this information carefully each time. Talk to your health care provider about the use of this drug in children. Special care may be needed. Overdosage: If you think you have taken too much of this medicine contact a poison control center  or emergency room at once. NOTE: This medicine is only for you. Do not share this medicine with others. What if I miss a dose? If you take your drug once a day, skip it. Take your next dose at the scheduled time the next morning. Do not take two doses on the same day. If you take your drug once a week, take the missed dose on the morning after you remember. Do not take two doses on the same day. What may interact with this medicine?  aluminum hydroxide  antacids  aspirin  calcium supplements  drugs for inflammation like ibuprofen, naproxen, and others  iron supplements  magnesium supplements  vitamins with minerals This list may not describe all possible interactions. Give your health care provider a list of all the medicines, herbs, non-prescription drugs, or dietary supplements you use. Also tell them if you smoke, drink alcohol, or use illegal drugs. Some items may interact with your medicine. What should I watch for while using this medicine? Visit your health care provider for regular checks on your progress. It may be some time before you see the benefit from this drug. Some people who take this drug have severe bone, joint, or muscle pain. This drug may also increase your risk for jaw problems or a broken thigh bone. Tell your health care provider right away if you have severe pain in your jaw, bones, joints, or muscles. Tell you health care provider if you have any pain that does not go  away or that gets worse. Tell your dentist and dental surgeon that you are taking this drug. You should not have major dental surgery while on this drug. See your dentist to have a dental exam and fix any dental problems before starting this drug. Take good care of your teeth while on this drug. Make sure you see your dentist for regular follow-up appointments. You should make sure you get enough calcium and vitamin D while you are taking this drug. Discuss the foods you eat and the vitamins you  take with your health care provider. You may need blood work done while you are taking this drug. What side effects may I notice from receiving this medicine? Side effects that you should report to your doctor or health care provider as soon as possible  allergic reactions (skin rash, itching or hives; swelling of the face, lips, or tongue)  bone pain  heartburn (burning feeling in chest, often after eating or when lying down)  jaw pain, especially after dental work  joint pain  low calcium levels (fast heartbeat; muscle cramps or pain; pain, tingling, or numbness in the hands or feet; seizures)  muscle pain  painful or difficulty swallowing  redness, blistering, peeling, or loosening of the skin, including inside the mouth  stomach bleeding (bloody or black, tarry stools; spitting up blood or brown material that looks like coffee grounds) Side effects that usually do not require medical attention (report to your doctor or health care provider if they continue or are bothersome):  changes in taste  constipation  diarrhea  nausea This list may not describe all possible side effects. Call your doctor for medical advice about side effects. You may report side effects to FDA at 1-800-FDA-1088. Where should I keep my medicine? Keep out of the reach of children and pets. Store at room temperature between 15 and 30 degrees C (59 and 86 degrees F). Throw away any unused drug after the expiration date. NOTE: This sheet is a summary. It may not cover all possible information. If you have questions about this medicine, talk to your doctor, pharmacist, or health care provider.  2021 Elsevier/Gold Standard (2019-08-07 10:49:07) Osteoporosis  Osteoporosis is when the bones get thin and weak. This can cause your bones to break (fracture) more easily. What are the causes? The exact cause of this condition is not known. What increases the risk?  Having family members with this  condition.  Not eating enough healthy foods.  Taking certain medicines.  Being female.  Being age 20 or older.  Smoking or using other products that contain nicotine or tobacco, such as e-cigarettes or chewing tobacco.  Not exercising.  Being of European or Asian ancestry.  Having a small body frame. What are the signs or symptoms? A broken bone might be the first sign, especially if the break results from a fall or injury that usually would not cause a bone to break. Other signs and symptoms include:  Pain in the neck or low back.  Being hunched over (stooped posture).  Getting shorter. How is this treated?  Eating more foods with more calcium and vitamin D in them.  Doing exercises.  Stopping tobacco use.  Limiting how much alcohol you drink.  Taking medicines to slow bone loss or help make the bones stronger.  Taking supplements of calcium and vitamin D every day.  Taking medicines to replace chemicals in the body (hormone replacement medicines).  Monitoring your levels of calcium and vitamin D. The  goal of treatment is to strengthen your bones and lower your risk for a bone break. Follow these instructions at home: Eating and drinking Eat plenty of calcium and vitamin D. These nutrients are good for your bones. Good sources of calcium and vitamin D include:  Some fish, such as salmon and tuna.  Foods that have calcium and vitamin D added to them (fortified foods), such as some breakfast cereals.  Egg yolks.  Cheese.  Liver.   Activity Do exercises as told by your doctor. Ask your doctor what exercises are safe for you. You should do:  Exercises that make your muscles work to hold your body weight up (weight-bearing exercises). These include tai chi, yoga, and walking.  Exercises to make your muscles stronger. One example is lifting weights. Lifestyle  Do not drink alcohol if: ? Your doctor tells you not to drink. ? You are pregnant, may be  pregnant, or are planning to become pregnant.  If you drink alcohol: ? Limit how much you use to:  0-1 drink a day for women.  0-2 drinks a day for men.  Know how much alcohol is in your drink. In the [place] one drink equals one 12 oz bottle of beer (355 mL), one 5 oz glass of wine (148 mL), or one 1 oz glass of hard liquor (44 mL).  Do not smoke or use any products that contain nicotine or tobacco. If you need help quitting, ask your doctor. Preventing falls  Use tools to help you move around (mobility aids) as needed. These include canes, walkers, scooters, and crutches.  Keep rooms well-lit.  Put away things on the floor that could make you trip. These include cords and rugs.  Install safety rails on stairs. Install grab bars in bathrooms.  Use rubber mats in slippery areas, like bathrooms.  Wear shoes that: ? Fit you well. ? Support your feet. ? Have closed toes. ? Have rubber soles or low heels.  Tell your doctor about all of the medicines you are taking. Some medicines can make you more likely to fall. General instructions  Take over-the-counter and prescription medicines only as told by your doctor.  Keep all follow-up visits. Contact a doctor if:  You have not been tested (screened) for osteoporosis and you are: ? A woman who is age 34 or older. ? A man who is age 52 or older. Get help right away if:  You fall.  You get hurt. Summary  Osteoporosis happens when your bones get thin and weak.  Weak bones can break (fracture) more easily.  Eat plenty of calcium and vitamin D. These are good for your bones.  Tell your doctor about all of the medicines that you take. This information is not intended to replace advice given to you by your health care provider. Make sure you discuss any questions you have with your health care provider. Document Revised: 04/22/2020 Document Reviewed: 04/22/2020 Elsevier Patient Education  [place].

## 2020-12-22 NOTE — Assessment & Plan Note (Signed)
Chronic, stable.  Continue Prilosec and adjust dose as needed.  She reports benefit from this medication.  Check Mag level annually. 

## 2020-12-22 NOTE — Assessment & Plan Note (Signed)
Ongoing with osteoporosis.  Continue supplement at home and check Vit D level today. 

## 2020-12-22 NOTE — Assessment & Plan Note (Signed)
Chronic, stable on current regimen with PHQ9 = 0.  Denies SI/HI.  Continue current medication regimen and adjust as needed.  Return in 6 months, refills sent in.

## 2020-12-22 NOTE — Assessment & Plan Note (Signed)
Has had trial of multiple statins, including on a 1 and 3 day a week schedule with poor response and myalgia.  Could consider Repatha in future for improved control, she wishes to think about this.

## 2020-12-22 NOTE — Assessment & Plan Note (Signed)
Chronic, poor tolerance to statins.  Had trial of Lipitor and Crestor with immediate muscle cramps.  Will consider injectable such as Repatha if ongoing elevations.  Discussed at length with patient.  Lipid panel today. 

## 2020-12-22 NOTE — Assessment & Plan Note (Signed)
Ongoing with some improvement on recent imaging.  Would consider referral to endo to discuss alternate options for treatment, as concern for exacerbation of GERD with oral medications -- she does not wish to trial these.  Provided information for her to read at home on medication and diagnosis - she will notify provider if wishes to trial oral medication or have referral.

## 2020-12-22 NOTE — Progress Notes (Signed)
BP 130/76   Pulse 72   Temp 98.3 F (36.8 C) (Oral)   Ht 5' 2.24" (1.581 m)   Wt 173 lb 6.4 oz (78.7 kg)   SpO2 98%   BMI 31.47 kg/m    Subjective:    Patient ID: Angela Martinez, female    DOB: August 26, 1951, 70 y.o.   MRN: [id number]  HPI: Angela Martinez is a 70 y.o. female presenting on 12/22/2020 for comprehensive medical examination. Current medical complaints include: none  She currently lives with: husband Menopausal Symptoms: none  Recent treatment for UTI on 08/19/20 -- treated with Macrobid at that time.  Previous was 05/10/20, 03/05/20, 10/15/19 -- educated her on pap smears and current guidelines.   HYPERTENSION / HYPERLIPIDEMIA Had muscle pains with statins in past, including Crestor and Lipitor, even on 1 and 3 day a week schedules.  Discussed possible use of injectables, like Repatha, in future for HLD due to ongoing side effects with statin therapy.  She reports interest in this in future if needed, wishes to focus on diet at this time.  Endorses at this time she has not been focused on diet as much and activity.   Satisfied with current treatment? no Duration of hypertension: chronic BP monitoring frequency: rarely BP range: 120-130/80's BP medication side effects: no Duration of hyperlipidemia: chronic Cholesterol medication side effects: no Cholesterol supplements: none Medication compliance: good compliance Aspirin: no Recent stressors: no Recurrent headaches: no Visual changes: no Palpitations: no Dyspnea: no Chest pain: no Lower extremity edema: no Dizzy/lightheaded: no  The 10-year ASCVD risk score Mikey Bussing DC Jr., et al., 2013) is: 11.5%   Values used to calculate the score:     Age: 31 years     Sex: Female     Is Non-Hispanic African American: No     Diabetic: No     Tobacco smoker: No     Systolic Blood Pressure: [id number]     Is BP treated: No     HDL Cholesterol: 47 mg/dL     Total Cholesterol: 231 mg/dL   GERD Continue on Prilosec.  Reports this is  well-controlled with this. GERD control status: controlled  Satisfied with current treatment? yes Heartburn frequency:  Medication side effects: no  Medication compliance: stable Dysphagia: no Odynophagia:  no Hematemesis: no Blood in stool: no EGD: no  OSTEOPOROSIS: Takes Vitamin D every day.  Last DEXA in 2017 noted T -3.6 and recent 05/25/20 -- the BMD measured at Femur Neck Left is 0.647 g/cm2 with a T-score of -2.8. .  No recent falls or fractures, but does have ongoing arthritis pain.  Has underlying GERD and does not want to take oral bisphosphonate due to risk for worsening GERD symptoms.  Takes Gabapentin for fibromyalgia.  DEPRESSION Continues on Prozac 20 MG daily. Mood status: stable Satisfied with current treatment?: yes Symptom severity: mild  Duration of current treatment : chronic Side effects: no Medication compliance: good compliance Psychotherapy/counseling: none Previous psychiatric medications: Prozac Depressed mood: no Anxious mood: no Anhedonia: no Significant weight loss or gain: no Insomnia: none Fatigue: no Feelings of worthlessness or guilt: no Impaired concentration/indecisiveness: no Suicidal ideations: no Hopelessness: no Crying spells: no Depression screen [facility] 2/9 12/22/2020 08/19/2020 05/10/2020 10/15/2019 09/02/2018  Decreased Interest 0 0 1 0 0  Down, Depressed, Hopeless 0 0 1 1 1   PHQ - 2 Score 0 0 2 1 1   Altered sleeping 0 1 1 0 0  Tired, decreased energy 0 1 1  1 1  Change in appetite 0 0 0 3 0  Feeling bad or failure about yourself  0 0 0 1 0  Trouble concentrating 0 0 0 0 0  Moving slowly or fidgety/restless 0 0 0 0 0  Suicidal thoughts 0 0 0 0 0  PHQ-9 Score 0 2 4 6 2   Difficult doing work/chores - Not difficult at all Somewhat difficult Not difficult at all Not difficult at all    The patient does not have a history of falls. I did not complete a risk assessment for falls. A plan of care for falls was not documented.   Past  Medical History:  Past Medical History:  Diagnosis Date  . Anxiety   . Complication of anesthesia    WITH WRIST SURGERY HARD TO GET RELAXED. OK WITH KNEE / BACK  . Cough   . Depression   . Fibromyalgia   . GERD (gastroesophageal reflux disease)   . Herpes simplex    1 AND 2  . Hyperlipidemia   . OA (osteoarthritis)   . Osteopenia   . Overweight   . Plantar fasciitis   . Shortness of breath dyspnea    DOE  . Tremors of nervous system     Surgical History:  Past Surgical History:  Procedure Laterality Date  . BACK SURGERY    . BUNIONECTOMY    . CATARACT EXTRACTION W/PHACO Right 03/16/2016   Procedure: CATARACT EXTRACTION PHACO AND INTRAOCULAR LENS PLACEMENT (IOC);  Surgeon: Leandrew Koyanagi, MD;  Location: [facility] ORS;  Service: Ophthalmology;  Laterality: Right;  [place] 52.4 AP% 8.8 CDE 4.63 Fluid Pack Lot # [id number]  . CATARACT EXTRACTION W/PHACO Left 05/18/2016   Procedure: CATARACT EXTRACTION PHACO AND INTRAOCULAR LENS PLACEMENT ([facility]);  Surgeon: Leandrew Koyanagi, MD;  Location: [facility] ORS;  Service: Ophthalmology;  Laterality: Left;  [place] 47.3 AP% 10.4 CDE 4.90 Fluid pack lot # [id number]  . DILATION AND CURETTAGE OF UTERUS     x 3  . JOINT REPLACEMENT    . LUMBAR FUSION     L4-L5 with cage  . TOTAL KNEE ARTHROPLASTY Left 2007  . WRIST SURGERY      Medications:  Current Outpatient Medications on File Prior to Visit  Medication Sig  . BIOTIN PO Take by mouth daily.  Marland Kitchen docusate sodium (COLACE) 50 MG capsule Take 50 mg by mouth daily.   . fluticasone (FLONASE) 50 MCG/ACT nasal spray Place 2 sprays into both nostrils daily.  Marland Kitchen loratadine (CLARITIN) 10 MG tablet Take 10 mg by mouth daily.  Marland Kitchen NAPROXEN PO Take by mouth as needed.  . valACYclovir (VALTREX) 500 MG tablet Take 1 tablet (500 mg total) by mouth as needed.  . Vitamin D, Cholecalciferol, 400 UNITS TABS Take 1,000 Units by mouth.    No current facility-administered medications on file prior to visit.     Allergies:  Allergies  Allergen Reactions  . Atorvastatin     Muscle cramps  . Crestor [Rosuvastatin Calcium]     Muscle aches  . Elemental Sulfur   . Lyrica [Pregabalin] Other (See Comments)    Spaced out  . Amoxicillin-Pot Clavulanate Nausea Only    Social History:  Social History   Socioeconomic History  . Marital status: Married    Spouse name: Not on file  . Number of children: Not on file  . Years of education: Not on file  . Highest education level: High school graduate  Occupational History  . Not on file  Tobacco Use  .  Smoking status: Never Smoker  . Smokeless tobacco: Never Used  Vaping Use  . Vaping Use: Never used  Substance and Sexual Activity  . Alcohol use: No  . Drug use: No  . Sexual activity: Not on file  Other Topics Concern  . Not on file  Social History Narrative  . Not on file   Social Determinants of Health   Financial Resource Strain: Not on file  Food Insecurity: Not on file  Transportation Needs: Not on file  Physical Activity: Not on file  Stress: Not on file  Social Connections: Not on file  Intimate Partner Violence: Not on file   Social History   Tobacco Use  Smoking Status Never Smoker  Smokeless Tobacco Never Used   Social History   Substance and Sexual Activity  Alcohol Use No    Family History:  Family History  Problem Relation Age of Onset  . Alcohol abuse Father   . Alcohol abuse Sister   . Arthritis Sister        5 sisters, some with RA  . Seizures Sister   . Cancer Sister        breast  . Diabetes Brother   . Breast cancer Neg Hx     Past medical history, surgical history, medications, allergies, family history and social history reviewed with patient today and changes made to appropriate areas of the chart.   Review of Systems - none All other ROS negative except what is listed above and in the HPI.      Objective:    BP 130/76   Pulse 72   Temp 98.3 F (36.8 C) (Oral)   Ht 5' 2.24"  (1.581 m)   Wt 173 lb 6.4 oz (78.7 kg)   SpO2 98%   BMI 31.47 kg/m   Wt Readings from Last 3 Encounters:  12/22/20 173 lb 6.4 oz (78.7 kg)  08/19/20 175 lb (79.4 kg)  08/19/20 174 lb 12.8 oz (79.3 kg)    Physical Exam Constitutional:      General: She is awake. She is not in acute distress.    Appearance: She is well-developed. She is not ill-appearing.  HENT:     Head: Normocephalic and atraumatic.     Right Ear: Hearing, tympanic membrane, ear canal and external ear normal. No drainage.     Left Ear: Hearing, tympanic membrane, ear canal and external ear normal. No drainage.     Nose: Nose normal.     Right Sinus: No maxillary sinus tenderness or frontal sinus tenderness.     Left Sinus: No maxillary sinus tenderness or frontal sinus tenderness.     Mouth/Throat:     Mouth: Mucous membranes are moist.     Pharynx: Oropharynx is clear. Uvula midline. No pharyngeal swelling, oropharyngeal exudate or posterior oropharyngeal erythema.  Eyes:     General: Lids are normal.        Right eye: No discharge.        Left eye: No discharge.     Extraocular Movements: Extraocular movements intact.     Conjunctiva/sclera: Conjunctivae normal.     Pupils: Pupils are equal, round, and reactive to light.     Visual Fields: Right eye visual fields normal and left eye visual fields normal.  Neck:     Thyroid: No thyromegaly.     Vascular: No carotid bruit.     Trachea: Trachea normal.  Cardiovascular:     Rate and Rhythm: Normal rate and regular rhythm.  Heart sounds: Normal heart sounds. No murmur heard. No gallop.   Pulmonary:     Effort: Pulmonary effort is normal. No accessory muscle usage or respiratory distress.     Breath sounds: Normal breath sounds.  Chest:  Breasts:     Right: Normal. No axillary adenopathy or supraclavicular adenopathy.     Left: Normal. No axillary adenopathy or supraclavicular adenopathy.    Abdominal:     General: Bowel sounds are normal.      Palpations: Abdomen is soft. There is no hepatomegaly or splenomegaly.     Tenderness: There is no abdominal tenderness. There is no right CVA tenderness or left CVA tenderness.  Musculoskeletal:        General: Normal range of motion.     Cervical back: Normal range of motion and neck supple.     Right lower leg: No edema.     Left lower leg: No edema.  Lymphadenopathy:     Head:     Right side of head: No submental, submandibular, tonsillar, preauricular or posterior auricular adenopathy.     Left side of head: No submental, submandibular, tonsillar, preauricular or posterior auricular adenopathy.     Cervical: No cervical adenopathy.     Upper Body:     Right upper body: No supraclavicular, axillary or pectoral adenopathy.     Left upper body: No supraclavicular, axillary or pectoral adenopathy.  Skin:    General: Skin is warm and dry.     Capillary Refill: Capillary refill takes less than 2 seconds.     Findings: No rash.  Neurological:     Mental Status: She is alert and oriented to person, place, and time.     Cranial Nerves: Cranial nerves are intact.     Gait: Gait is intact.     Deep Tendon Reflexes: Reflexes are normal and symmetric.     Reflex Scores:      Brachioradialis reflexes are 2+ on the right side and 2+ on the left side.      Patellar reflexes are 2+ on the right side and 2+ on the left side. Psychiatric:        Attention and Perception: Attention normal.        Mood and Affect: Mood normal.        Speech: Speech normal.        Behavior: Behavior normal. Behavior is cooperative.        Thought Content: Thought content normal.        Judgment: Judgment normal.     Results for orders placed or performed in visit on 08/19/20  Microscopic Examination   Urine  Result Value Ref Range   WBC, UA 6-10 (A) 0 - 5 /hpf   RBC None seen 0 - 2 /hpf   Epithelial Cells (non renal) 0-10 0 - 10 /hpf   Bacteria, UA Many (A) None seen/Few  Urine Culture, Reflex   Urine   Result Value Ref Range   Urine Culture, Routine Final report (A)    Organism ID, Bacteria Escherichia coli (A)    Antimicrobial Susceptibility Comment   UA/M w/rflx Culture, Routine (STAT)   Specimen: Urine   Urine  Result Value Ref Range   Specific Gravity, UA 1.010 1.005 - 1.030   pH, UA 5.0 5.0 - 7.5   Color, UA Yellow Yellow   Appearance Ur Clear Clear   Leukocytes,UA Trace (A) Negative   Protein,UA Negative Negative/Trace   Glucose, UA Negative Negative   Ketones,  UA Negative Negative   RBC, UA Negative Negative   Bilirubin, UA Negative Negative   Urobilinogen, Ur 0.2 0.2 - 1.0 mg/dL   Nitrite, UA Positive (A) Negative   Microscopic Examination See below:    Urinalysis Reflex Comment       Assessment & Plan:   Problem List Items Addressed This Visit      Cardiovascular and Mediastinum   Essential hypertension    Chronic, stable with no medications, BP on recheck is below goal.  Continue current diet control and adjust plan as needed based on readings.  Continue to check BP at home regularly and document.  Initiate medication as needed.  Focus on DASH diet.  Labs today: CBC, CMP, TSH.  Return in 6 months.      Relevant Orders   Urinalysis, Routine w reflex microscopic   CBC with Differential/Platelet   Comprehensive metabolic panel     Digestive   GERD (gastroesophageal reflux disease)    Chronic, stable.  Continue Prilosec and adjust dose as needed.  She reports benefit from this medication.  Check Mag level annually.      Relevant Medications   omeprazole (PRILOSEC) 20 MG capsule   Other Relevant Orders   Magnesium     Musculoskeletal and Integument   Osteoporosis    Ongoing with some improvement on recent imaging.  Would consider referral to endo to discuss alternate options for treatment, as concern for exacerbation of GERD with oral medications -- she does not wish to trial these.  Provided information for her to read at home on medication and diagnosis -  she will notify provider if wishes to trial oral medication or have referral.      Relevant Orders   TSH   VITAMIN D 25 Hydroxy (Vit-D Deficiency, Fractures)     Other   Fibromyalgia    Chronic, stable on Gabapentin.  Continue current medication and adjust as needed.  Return in 6 months.  Monitor kidney function and renal dose Gabapentin as needed.      Relevant Medications   FLUoxetine (PROZAC) 20 MG capsule   gabapentin (NEURONTIN) 300 MG capsule   Hyperlipidemia    Chronic, poor tolerance to statins.  Had trial of Lipitor and Crestor with immediate muscle cramps.  Will consider injectable such as Repatha if ongoing elevations.  Discussed at length with patient.  Lipid panel today.      Relevant Orders   Comprehensive metabolic panel   Lipid Panel w/o Chol/HDL Ratio   Obesity    BMI 31.47.  Recommended eating smaller high protein, low fat meals more frequently and exercising 30 mins a day 5 times a week with a goal of 10-15lb weight loss in the next 3 months. Patient voiced their understanding and motivation to adhere to these recommendations.       Depression, major, single episode, mild (HCC) - Primary    Chronic, stable on current regimen with PHQ9 = 0.  Denies SI/HI.  Continue current medication regimen and adjust as needed.  Return in 6 months, refills sent in.      Relevant Medications   FLUoxetine (PROZAC) 20 MG capsule   Vitamin D deficiency    Ongoing with osteoporosis.  Continue supplement at home and check Vit D level today.      Relevant Orders   VITAMIN D 25 Hydroxy (Vit-D Deficiency, Fractures)   Myalgia due to statin    Has had trial of multiple statins, including on a 1 and 3 day  a week schedule with poor response and myalgia.  Could consider Repatha in future for improved control, she wishes to think about this.       Other Visit Diagnoses    Annual physical exam       Annual labs today to include CBC, CMP, TSH, lipid, urine.       Follow up  plan: Return in about 6 months (around 06/21/2021) for HTN/HLD, MOOD, GERD, OSTEOPOROSIS.   LABORATORY TESTING:  - Pap smear: not applicable  IMMUNIZATIONS:   - Tdap: Tetanus vaccination status reviewed: last tetanus booster within 10 years. - Influenza: Up to date - Pneumovax: Refused - Prevnar: Refused - HPV: Not applicable - Zostavax vaccine: Refused  SCREENING: -Mammogram: Up To Date July 2021 - Colonoscopy: Up to date  - Bone Density: Up To Date July 2021 -Hearing Test: Not applicable  -Spirometry: Not applicable   PATIENT COUNSELING:   Advised to take 1 mg of folate supplement per day if capable of pregnancy.   Sexuality: Discussed sexually transmitted diseases, partner selection, use of condoms, avoidance of unintended pregnancy  and contraceptive alternatives.   Advised to avoid cigarette smoking.  I discussed with the patient that most people either abstain from alcohol or drink within safe limits (<=14/week and <=4 drinks/occasion for males, <=7/weeks and <= 3 drinks/occasion for females) and that the risk for alcohol disorders and other health effects rises proportionally with the number of drinks per week and how often a drinker exceeds daily limits.  Discussed cessation/primary prevention of drug use and availability of treatment for abuse.   Diet: Encouraged to adjust caloric intake to maintain  or achieve ideal body weight, to reduce intake of dietary saturated fat and total fat, to limit sodium intake by avoiding high sodium foods and not adding table salt, and to maintain adequate dietary potassium and calcium preferably from fresh fruits, vegetables, and low-fat dairy products.    Stressed the importance of regular exercise  Injury prevention: Discussed safety belts, safety helmets, smoke detector, smoking near bedding or upholstery.   Dental health: Discussed importance of regular tooth brushing, flossing, and dental visits.    NEXT PREVENTATIVE PHYSICAL DUE  IN 1 YEAR. Return in about 6 months (around 06/21/2021) for HTN/HLD, MOOD, GERD, OSTEOPOROSIS.

## 2020-12-22 NOTE — Assessment & Plan Note (Signed)
Chronic, stable with no medications, BP on recheck is below goal.  Continue current diet control and adjust plan as needed based on readings.  Continue to check BP at home regularly and document.  Initiate medication as needed.  Focus on DASH diet.  Labs today: CBC, CMP, TSH.  Return in 6 months.

## 2020-12-23 LAB — COMPREHENSIVE METABOLIC PANEL
ALT: 7 IU/L (ref 0–32)
AST: 14 IU/L (ref 0–40)
Albumin/Globulin Ratio: 1.6 (ref 1.2–2.2)
Albumin: 4.4 g/dL (ref 3.7–4.7)
Alkaline Phosphatase: 71 IU/L (ref 44–121)
BUN/Creatinine Ratio: 14 (ref 12–28)
BUN: 15 mg/dL (ref 8–27)
Bilirubin Total: 0.4 mg/dL (ref 0.0–1.2)
CO2: 22 mmol/L (ref 20–29)
Calcium: 9.7 mg/dL (ref 8.7–10.3)
Chloride: 104 mmol/L (ref 96–106)
Creatinine, Ser: 1.07 mg/dL — ABNORMAL HIGH (ref 0.57–1.00)
GFR calc Af Amer: 60 mL/min/{1.73_m2} (ref >59)
GFR calc non Af Amer: 52 mL/min/{1.73_m2} — ABNORMAL LOW (ref >59)
Globulin, Total: 2.7 g/dL (ref 1.5–4.5)
Glucose: 87 mg/dL (ref 65–99)
Potassium: 4.5 mmol/L (ref 3.5–5.2)
Sodium: 144 mmol/L (ref 134–144)
Total Protein: 7.1 g/dL (ref 6.0–8.5)

## 2020-12-23 LAB — LIPID PANEL W/O CHOL/HDL RATIO
Cholesterol, Total: 279 mg/dL — ABNORMAL HIGH (ref 100–199)
HDL: 46 mg/dL (ref >39)
LDL Chol Calc (NIH): 206 mg/dL — ABNORMAL HIGH (ref 0–99)
Triglycerides: 143 mg/dL (ref 0–149)
VLDL Cholesterol Cal: 27 mg/dL (ref 5–40)

## 2020-12-23 LAB — CBC WITH DIFFERENTIAL/PLATELET
Basophils Absolute: 0.1 10*3/uL (ref 0.0–0.2)
Basos: 1 %
EOS (ABSOLUTE): 0.2 10*3/uL (ref 0.0–0.4)
Eos: 3 %
Hematocrit: 43.4 % (ref 34.0–46.6)
Hemoglobin: 14 g/dL (ref 11.1–15.9)
Immature Grans (Abs): 0 10*3/uL (ref 0.0–0.1)
Immature Granulocytes: 0 %
Lymphocytes Absolute: 2.2 10*3/uL (ref 0.7–3.1)
Lymphs: 32 %
MCH: 25.1 pg — ABNORMAL LOW (ref 26.6–33.0)
MCHC: 32.3 g/dL (ref 31.5–35.7)
MCV: 78 fL — ABNORMAL LOW (ref 79–97)
Monocytes Absolute: 0.5 10*3/uL (ref 0.1–0.9)
Monocytes: 7 %
Neutrophils Absolute: 3.9 10*3/uL (ref 1.4–7.0)
Neutrophils: 57 %
Platelets: 342 10*3/uL (ref 150–450)
RBC: 5.57 x10E6/uL — ABNORMAL HIGH (ref 3.77–5.28)
RDW: 15.8 % — ABNORMAL HIGH (ref 11.7–15.4)
WBC: 6.8 10*3/uL (ref 3.4–10.8)

## 2020-12-23 LAB — VITAMIN D 25 HYDROXY (VIT D DEFICIENCY, FRACTURES): Vit D, 25-Hydroxy: 38.7 ng/mL (ref 30.0–100.0)

## 2020-12-23 LAB — TSH: TSH: 3.19 u[IU]/mL (ref 0.450–4.500)

## 2020-12-23 LAB — MAGNESIUM: Magnesium: 2.1 mg/dL (ref 1.6–2.3)

## 2020-12-23 NOTE — Progress Notes (Signed)
Please let Sylvie know her labs have returned: - No urine infection was present this check, great news!! - CBC shows no anemia.  Kidney function shows some mild kidney disease, which we will recheck next visit.  Please ensure drinking lots of water at home daily and avoid medications that contain Ibuprofen.  Liver function is normal. - Vit D, thyroid, and magnesium levels are normal. - Cholesterol levels are elevated, I do recommend in future possibly trial of Zetia, which is not a statin or starting Repatha, which is an injectable we discussed to lower cholesterol levels.  Any questions? Keep being awesome!!  Thank you for allowing me to participate in your care. Kindest regards, Mishelle Hassan

## 2020-12-27 ENCOUNTER — Other Ambulatory Visit: Payer: Self-pay

## 2020-12-27 ENCOUNTER — Ambulatory Visit (INDEPENDENT_AMBULATORY_CARE_PROVIDER_SITE_OTHER): Payer: PPO

## 2020-12-27 DIAGNOSIS — Z23 Encounter for immunization: Secondary | ICD-10-CM | POA: Diagnosis not present

## 2021-03-21 ENCOUNTER — Telehealth: Payer: Self-pay

## 2021-03-21 DIAGNOSIS — Z20822 Contact with and (suspected) exposure to covid-19: Secondary | ICD-10-CM | POA: Diagnosis not present

## 2021-03-21 DIAGNOSIS — Z03818 Encounter for observation for suspected exposure to other biological agents ruled out: Secondary | ICD-10-CM | POA: Diagnosis not present

## 2021-03-21 NOTE — Telephone Encounter (Signed)
Copied from [place] [id number]. Topic: General - Other >> Mar 21, 2021 11:46 AM Leward Quan A wrote: Reason for CRM: Patient called in asking to get an appointment she however failed the DT but still insist that she want to be seen in the office. I was offering an appointment virtually with Lauren but she refused and insist that she would prefer being seen in the office. Patient is very hoarse has had cough, flu like symptoms and other symptoms for about 2 weeks she say. Please advise call  Ph#  [phone]   Pt stated she started having diarehea as well the patient  Has birds and stated she felt sick after washing her bird feeder had a temp for 1 day and sore throat yellow mucus and has blown nose so much it bleeds started about less then a week the patient has had all her VAX Can pt be seen in office or virtual? Pt requests if she can come in due to having pneumonia in the past and is concerned it will turn to pneumonia.

## 2021-03-21 NOTE — Telephone Encounter (Signed)
PT Verbalized understanding.

## 2021-03-22 ENCOUNTER — Ambulatory Visit (INDEPENDENT_AMBULATORY_CARE_PROVIDER_SITE_OTHER): Payer: PPO | Admitting: Nurse Practitioner

## 2021-03-22 ENCOUNTER — Other Ambulatory Visit: Payer: Self-pay

## 2021-03-22 ENCOUNTER — Encounter: Payer: Self-pay | Admitting: Nurse Practitioner

## 2021-03-22 VITALS — BP 112/72 | HR 83 | Temp 98.8°F | Wt 173.4 lb

## 2021-03-22 DIAGNOSIS — J069 Acute upper respiratory infection, unspecified: Secondary | ICD-10-CM | POA: Diagnosis not present

## 2021-03-22 MED ORDER — AZITHROMYCIN 250 MG PO TABS
ORAL_TABLET | ORAL | 0 refills | Status: AC
Start: 2021-03-22 — End: 2021-03-27

## 2021-03-22 MED ORDER — PREDNISONE 10 MG PO TABS: ORAL_TABLET | ORAL | 0 refills | Status: DC

## 2021-03-22 MED ORDER — BENZONATATE 100 MG PO CAPS
100.0000 mg | ORAL_CAPSULE | Freq: Three times a day (TID) | ORAL | 0 refills | Status: DC | PRN
Start: 2021-03-22 — End: 2021-09-08

## 2021-03-22 NOTE — Progress Notes (Signed)
Acute Office Visit  Subjective:    Patient ID: Angela Martinez, female    DOB: 05/11/1951, 70 y.o.   MRN: [id number]  Chief Complaint  Patient presents with  . Cough    Pt states she has had a cough, sore throat, congestion, diarrhea, and sinus pressure for the last week and a half. States she had a negative covid test with [facility] yesterday. States she took OTC meds with little to no relief     HPI Patient is in today for cough about a week and a half ago.   UPPER RESPIRATORY TRACT INFECTION  Worst symptom: fatigue Fever: subjective yes Cough: yes, productive green/yellow Shortness of breath: no Wheezing: no Chest pain: no Chest tightness: no Chest congestion: no Nasal congestion: yes Runny nose: yes Post nasal drip: yes Sneezing: yes Sore throat: yes Swollen glands: no Sinus pressure: some Headache: no Face pain: no Toothache: no Ear pain: no  Ear pressure: yes "right Eyes red/itching:no Eye drainage/crusting: no  Vomiting: no Rash: no Fatigue: yes Sick contacts: yes, husband Strep contacts: no  Context: fluctuating Recurrent sinusitis: no Relief with OTC cold/cough medications: no  Treatments attempted: cold/sinus and mucinex, alkaseltzer, delsym     Past Medical History:  Diagnosis Date  . Anxiety   . Complication of anesthesia    WITH WRIST SURGERY HARD TO GET RELAXED. OK WITH KNEE / BACK  . Cough   . Depression   . Fibromyalgia   . GERD (gastroesophageal reflux disease)   . Herpes simplex    1 AND 2  . Hyperlipidemia   . OA (osteoarthritis)   . Osteopenia   . Overweight   . Plantar fasciitis   . Shortness of breath dyspnea    DOE  . Tremors of nervous system     Past Surgical History:  Procedure Laterality Date  . BACK SURGERY    . BUNIONECTOMY    . CATARACT EXTRACTION W/PHACO Right 03/16/2016   Procedure: CATARACT EXTRACTION PHACO AND INTRAOCULAR LENS PLACEMENT ([facility]);  Surgeon: Leandrew Koyanagi, MD;  Location: [facility] ORS;   Service: Ophthalmology;  Laterality: Right;  [place] 52.4 AP% 8.8 CDE 4.63 Fluid Pack Lot # [id number]  . CATARACT EXTRACTION W/PHACO Left 05/18/2016   Procedure: CATARACT EXTRACTION PHACO AND INTRAOCULAR LENS PLACEMENT ([place]);  Surgeon: Leandrew Koyanagi, MD;  Location: [facility] ORS;  Service: Ophthalmology;  Laterality: Left;  [place] 47.3 AP% 10.4 CDE 4.90 Fluid pack lot # [id number]  . DILATION AND CURETTAGE OF UTERUS     x 3  . JOINT REPLACEMENT    . LUMBAR FUSION     L4-L5 with cage  . TOTAL KNEE ARTHROPLASTY Left 2007  . WRIST SURGERY      Family History  Problem Relation Age of Onset  . Alcohol abuse Father   . Alcohol abuse Sister   . Arthritis Sister        5 sisters, some with RA  . Seizures Sister   . Cancer Sister        breast  . Cirrhosis Sister   . Diabetes Brother   . Breast cancer Neg Hx     Social History   Socioeconomic History  . Marital status: Married    Spouse name: Not on file  . Number of children: Not on file  . Years of education: Not on file  . Highest education level: High school graduate  Occupational History  . Not on file  Tobacco Use  . Smoking status: Never Smoker  .  Smokeless tobacco: Never Used  Vaping Use  . Vaping Use: Never used  Substance and Sexual Activity  . Alcohol use: No  . Drug use: No  . Sexual activity: Not on file  Other Topics Concern  . Not on file  Social History Narrative  . Not on file   Social Determinants of Health   Financial Resource Strain: Not on file  Food Insecurity: Not on file  Transportation Needs: Not on file  Physical Activity: Not on file  Stress: Not on file  Social Connections: Not on file  Intimate Partner Violence: Not on file    Outpatient Medications Prior to Visit  Medication Sig Dispense Refill  . BIOTIN PO Take by mouth daily.    Marland Kitchen docusate sodium (COLACE) 50 MG capsule Take 50 mg by mouth daily.     Marland Kitchen FLUoxetine (PROZAC) 20 MG capsule Take 1 tablet (20 mg total) by mouth daily. 90  capsule 4  . fluticasone (FLONASE) 50 MCG/ACT nasal spray Place 2 sprays into both nostrils daily. 16 g 6  . gabapentin (NEURONTIN) 300 MG capsule Take 1 capsule (300 mg total) by mouth 2 (two) times daily. 180 capsule 4  . loratadine (CLARITIN) 10 MG tablet Take 10 mg by mouth daily.    Marland Kitchen NAPROXEN PO Take by mouth as needed.    Marland Kitchen omeprazole (PRILOSEC) 20 MG capsule Take 1 capsule (20 mg total) by mouth daily. 90 capsule 4  . valACYclovir (VALTREX) 500 MG tablet Take 1 tablet (500 mg total) by mouth as needed. 30 tablet 4  . Vitamin D, Cholecalciferol, 400 UNITS TABS Take 1,000 Units by mouth.      No facility-administered medications prior to visit.    Allergies  Allergen Reactions  . Atorvastatin     Muscle cramps  . Crestor [Rosuvastatin Calcium]     Muscle aches  . Elemental Sulfur   . Lyrica [Pregabalin] Other (See Comments)    Spaced out  . Amoxicillin-Pot Clavulanate Nausea Only    Review of Systems  Constitutional: Positive for fatigue and fever.  HENT: Positive for congestion, ear pain, postnasal drip, rhinorrhea, sinus pressure, sneezing and sore throat. Negative for nosebleeds, sinus pain and tinnitus.   Eyes: Negative.   Respiratory: Positive for cough. Negative for shortness of breath.   Cardiovascular: Negative.   Gastrointestinal: Positive for diarrhea. Negative for abdominal pain and constipation.  Genitourinary: Negative.   Musculoskeletal: Negative.   Skin: Negative.   Neurological: Negative.        Objective:    Physical Exam Vitals and nursing note reviewed.  Constitutional:      General: She is not in acute distress.    Appearance: Normal appearance.  HENT:     Head: Normocephalic.     Right Ear: Ear canal and external ear normal. A middle ear effusion is present.     Left Ear: Ear canal and external ear normal. A middle ear effusion is present.     Nose: Congestion and rhinorrhea present.     Mouth/Throat:     Pharynx: Posterior oropharyngeal  erythema present.  Eyes:     Conjunctiva/sclera: Conjunctivae normal.  Cardiovascular:     Rate and Rhythm: Normal rate and regular rhythm.     Pulses: Normal pulses.     Heart sounds: Normal heart sounds.  Pulmonary:     Effort: Pulmonary effort is normal.     Breath sounds: Normal breath sounds.  Abdominal:     Palpations: Abdomen is soft.  Tenderness: There is no abdominal tenderness.  Musculoskeletal:     Cervical back: Normal range of motion.  Skin:    General: Skin is warm.  Neurological:     General: No focal deficit present.     Mental Status: She is alert and oriented to person, place, and time.  Psychiatric:        Mood and Affect: Mood normal.        Behavior: Behavior normal.        Thought Content: Thought content normal.        Judgment: Judgment normal.     BP 112/72   Pulse 83   Temp 98.8 F (37.1 C) (Oral)   Wt 173 lb 6.4 oz (78.7 kg)   SpO2 95%   BMI 31.47 kg/m  Wt Readings from Last 3 Encounters:  03/22/21 173 lb 6.4 oz (78.7 kg)  12/22/20 173 lb 6.4 oz (78.7 kg)  08/19/20 175 lb (79.4 kg)    There are no preventive care reminders to display for this patient.  There are no preventive care reminders to display for this patient.   Lab Results  Component Value Date   TSH 3.190 12/22/2020   Lab Results  Component Value Date   WBC 6.8 12/22/2020   HGB 14.0 12/22/2020   HCT 43.4 12/22/2020   MCV 78 (L) 12/22/2020   PLT 342 12/22/2020   Lab Results  Component Value Date   NA 144 12/22/2020   K 4.5 12/22/2020   CO2 22 12/22/2020   GLUCOSE 87 12/22/2020   BUN 15 12/22/2020   CREATININE 1.07 (H) 12/22/2020   BILITOT 0.4 12/22/2020   ALKPHOS 71 12/22/2020   AST 14 12/22/2020   ALT 7 12/22/2020   PROT 7.1 12/22/2020   ALBUMIN 4.4 12/22/2020   CALCIUM 9.7 12/22/2020   Lab Results  Component Value Date   CHOL 279 (H) 12/22/2020   Lab Results  Component Value Date   HDL 46 12/22/2020   Lab Results  Component Value Date    LDLCALC 206 (H) 12/22/2020   Lab Results  Component Value Date   TRIG 143 12/22/2020   Lab Results  Component Value Date   CHOLHDL 5.0 (H) 09/02/2018   No results found for: HGBA1C     Assessment & Plan:   Problem List Items Addressed This Visit   None   Visit Diagnoses    Upper respiratory tract infection, unspecified type    -  Primary   Symptoms for >10 days. Treat with prednisone, z-pak, and tessalon prn. Continue fluids, rest, mucinex. F/U if symptoms worsen or don't improve   Relevant Medications   azithromycin (ZITHROMAX) 250 MG tablet       Meds ordered this encounter  Medications  . predniSONE (DELTASONE) 10 MG tablet    Sig: Take 6 tablets today, then 5 tablets tomorrow, then decrease by 1 tablet every day until gone    Dispense:  21 tablet    Refill:  0  . azithromycin (ZITHROMAX) 250 MG tablet    Sig: Take 2 tablets on day 1, then 1 tablet daily on days 2 through 5    Dispense:  6 tablet    Refill:  0  . benzonatate (TESSALON) 100 MG capsule    Sig: Take 1 capsule (100 mg total) by mouth 3 (three) times daily as needed for cough.    Dispense:  20 capsule    Refill:  0     Charyl Dancer, NP

## 2021-03-22 NOTE — Patient Instructions (Signed)
Upper Respiratory Infection, Adult An upper respiratory infection (URI) is a common viral infection of the nose, throat, and upper air passages that lead to the lungs. The most common type of URI is the common cold. URIs usually get better on their own, without medical treatment. What are the causes? A URI is caused by a virus. You may catch a virus by:  Breathing in droplets from an infected person's cough or sneeze.  Touching something that has been exposed to the virus (contaminated) and then touching your mouth, nose, or eyes. What increases the risk? You are more likely to get a URI if:  You are very young or very old.  It is autumn or winter.  You have close contact with others, such as at a daycare, school, or health care facility.  You smoke.  You have long-term (chronic) heart or lung disease.  You have a weakened disease-fighting (immune) system.  You have nasal allergies or asthma.  You are experiencing a lot of stress.  You work in an area that has poor air circulation.  You have poor nutrition. What are the signs or symptoms? A URI usually involves some of the following symptoms:  Runny or stuffy (congested) nose.  Sneezing.  Cough.  Sore throat.  Headache.  Fatigue.  Fever.  Loss of appetite.  Pain in your forehead, behind your eyes, and over your cheekbones (sinus pain).  Muscle aches.  Redness or irritation of the eyes.  Pressure in the ears or face. How is this diagnosed? This condition may be diagnosed based on your medical history and symptoms, and a physical exam. Your health care provider may use a cotton swab to take a mucus sample from your nose (nasal swab). This sample can be tested to determine what virus is causing the illness. How is this treated? URIs usually get better on their own within 7-10 days. You can take steps at home to relieve your symptoms. Medicines cannot cure URIs, but your health care provider may recommend  certain medicines to help relieve symptoms, such as:  Over-the-counter cold medicines.  Cough suppressants. Coughing is a type of defense against infection that helps to clear the respiratory system, so take these medicines only as recommended by your health care provider.  Fever-reducing medicines. Follow these instructions at home: Activity  Rest as needed.  If you have a fever, stay home from work or school until your fever is gone or until your health care provider says you are no longer contagious. Your health care provider may have you wear a face mask to prevent your infection from spreading. Relieving symptoms  Gargle with a salt-water mixture 3-4 times a day or as needed. To make a salt-water mixture, completely dissolve -1 tsp of salt in 1 cup of warm water.  Use a cool-mist humidifier to add moisture to the air. This can help you breathe more easily. Eating and drinking  Drink enough fluid to keep your urine pale yellow.  Eat soups and other clear broths.   General instructions  Take over-the-counter and prescription medicines only as told by your health care provider. These include cold medicines, fever reducers, and cough suppressants.  Do not use any products that contain nicotine or tobacco, such as cigarettes and e-cigarettes. If you need help quitting, ask your health care provider.  Stay away from secondhand smoke.  Stay up to date on all immunizations, including the yearly (annual) flu vaccine.  Keep all follow-up visits as told by your health  care provider. This is important.   How to prevent the spread of infection to others  URIs can be passed from person to person (are contagious). To prevent the infection from spreading: ? Wash your hands often with soap and water. If soap and water are not available, use hand sanitizer. ? Avoid touching your mouth, face, eyes, or nose. ? Cough or sneeze into a tissue or your sleeve or elbow instead of into your hand or  into the air.   Contact a health care provider if:  You are getting worse instead of better.  You have a fever or chills.  Your mucus is brown or red.  You have yellow or brown discharge coming from your nose.  You have pain in your face, especially when you bend forward.  You have swollen neck glands.  You have pain while swallowing.  You have white areas in the back of your throat. Get help right away if:  You have shortness of breath that gets worse.  You have severe or persistent: ? Headache. ? Ear pain. ? Sinus pain. ? Chest pain.  You have chronic lung disease along with any of the following: ? Wheezing. ? Prolonged cough. ? Coughing up blood. ? A change in your usual mucus.  You have a stiff neck.  You have changes in your: ? Vision. ? Hearing. ? Thinking. ? Mood. Summary  An upper respiratory infection (URI) is a common infection of the nose, throat, and upper air passages that lead to the lungs.  A URI is caused by a virus.  URIs usually get better on their own within 7-10 days.  Medicines cannot cure URIs, but your health care provider may recommend certain medicines to help relieve symptoms. This information is not intended to replace advice given to you by your health care provider. Make sure you discuss any questions you have with your health care provider. Document Revised: 07/15/2020 Document Reviewed: 07/15/2020 Elsevier Patient Education  [place].

## 2021-05-16 ENCOUNTER — Encounter: Payer: Self-pay | Admitting: Nurse Practitioner

## 2021-05-30 ENCOUNTER — Telehealth: Payer: Self-pay

## 2021-05-30 NOTE — Telephone Encounter (Signed)
If pt calls back let her know Ruthven is her PCP now she was ok to see her as a pt

## 2021-05-30 NOTE — Telephone Encounter (Signed)
Copied from [place] [id number]. Topic: General - Other >> May 24, 2021  8:46 AM Celene Kras wrote: Reason for CRM: Pt called and is requesting to be seen by Dr. Neomia Dear instead of Henrine Screws so that she is seeing the same Provider as her husband. Please advise.

## 2021-06-22 ENCOUNTER — Ambulatory Visit: Payer: PPO | Admitting: Nurse Practitioner

## 2021-06-22 ENCOUNTER — Ambulatory Visit (INDEPENDENT_AMBULATORY_CARE_PROVIDER_SITE_OTHER): Payer: PPO | Admitting: Internal Medicine

## 2021-06-22 ENCOUNTER — Encounter: Payer: Self-pay | Admitting: Internal Medicine

## 2021-06-22 ENCOUNTER — Other Ambulatory Visit: Payer: Self-pay

## 2021-06-22 VITALS — BP 133/84 | HR 78 | Temp 98.7°F | Ht 64.96 in | Wt 175.0 lb

## 2021-06-22 DIAGNOSIS — Z1239 Encounter for other screening for malignant neoplasm of breast: Secondary | ICD-10-CM | POA: Diagnosis not present

## 2021-06-22 DIAGNOSIS — R197 Diarrhea, unspecified: Secondary | ICD-10-CM | POA: Insufficient documentation

## 2021-06-22 DIAGNOSIS — Z1211 Encounter for screening for malignant neoplasm of colon: Secondary | ICD-10-CM

## 2021-06-22 DIAGNOSIS — M797 Fibromyalgia: Secondary | ICD-10-CM | POA: Diagnosis not present

## 2021-06-22 DIAGNOSIS — I10 Essential (primary) hypertension: Secondary | ICD-10-CM

## 2021-06-22 DIAGNOSIS — Z124 Encounter for screening for malignant neoplasm of cervix: Secondary | ICD-10-CM

## 2021-06-22 LAB — URINALYSIS, ROUTINE W REFLEX MICROSCOPIC
Bilirubin, UA: NEGATIVE
Glucose, UA: NEGATIVE
Ketones, UA: NEGATIVE
Leukocytes,UA: NEGATIVE
Nitrite, UA: NEGATIVE
Protein,UA: NEGATIVE
RBC, UA: NEGATIVE
Specific Gravity, UA: 1.015 (ref 1.005–1.030)
Urobilinogen, Ur: 0.2 mg/dL (ref 0.2–1.0)
pH, UA: 6 (ref 5.0–7.5)

## 2021-06-22 MED ORDER — ALBENDAZOLE 200 MG PO TABS: 400.0000 mg | ORAL_TABLET | Freq: Two times a day (BID) | ORAL | 0 refills | Status: AC

## 2021-06-22 NOTE — Progress Notes (Signed)
BP 133/84   Pulse 78   Temp 98.7 F (37.1 C) (Oral)   Ht 5' 4.96" (1.65 m)   Wt 175 lb (79.4 kg)   SpO2 96%   BMI 29.16 kg/m    Subjective:    Patient ID: Angela Martinez, female    DOB: [birth year], 71 y.o.   MRN: [id number]  Chief Complaint  Patient presents with   Hypertension   Hyperlipidemia   Gastroesophageal Reflux   Osteoporosis   Mood    HPI: Angela Martinez is a 70 y.o. female  Pt is here to establish care Has been gardening, concerns for pin worms has a ho such.   Hypertension This is a chronic problem. The problem is controlled. Pertinent negatives include no anxiety, blurred vision, chest pain, headaches, malaise/fatigue, neck pain, palpitations, peripheral edema or shortness of breath.  Hyperlipidemia This is a chronic problem. Pertinent negatives include no chest pain, myalgias or shortness of breath.  Gastroesophageal Reflux She complains of abdominal pain. She reports no belching, no chest pain, no choking, no coughing, no dysphagia, no early satiety, no globus sensation, no heartburn, no hoarse voice, no nausea, no sore throat, no stridor, no tooth decay, no water brash or no wheezing. has some cramping in the left side of the [place].. Pertinent negatives include no fatigue.  Diarrhea  This is a chronic (has a ho pin worms. says she feels a little wiggle . says she got one out x 3-4 years ago.) problem. The current episode started more than 1 month ago. Associated symptoms include abdominal pain. Pertinent negatives include no arthralgias, chills, coughing, fever, headaches, myalgias or vomiting. Associated symptoms comments: Has diarrhea x 3 times a day once a week x 3 months. Then clears off, not associated it yet with any food. Had a ho constipation .  Depression      (has some cramping in the left side of the [place].)  This is a chronic (is on prozac for such) problem.  Associated symptoms include no decreased concentration, no fatigue, no appetite change, no  myalgias, no headaches and no suicidal ideas.   Pertinent negatives include no anxiety.  Chief Complaint  Patient presents with   Hypertension   Hyperlipidemia   Gastroesophageal Reflux   Osteoporosis   Mood    Relevant past medical, surgical, family and social history reviewed and updated as indicated. Interim medical history since our last visit reviewed. Allergies and medications reviewed and updated.  Review of Systems  Constitutional:  Negative for activity change, appetite change, chills, fatigue, fever and malaise/fatigue.  HENT:  Negative for congestion, ear discharge, ear pain, facial swelling, hoarse voice and sore throat.   Eyes:  Negative for blurred vision, pain and itching.  Respiratory:  Negative for apnea, cough, choking, chest tightness, shortness of breath and wheezing.   Cardiovascular:  Negative for chest pain, palpitations and leg swelling.  Gastrointestinal:  Positive for abdominal pain and diarrhea. Negative for abdominal distention, anal bleeding, blood in stool, constipation, dysphagia, heartburn, nausea, rectal pain and vomiting.  Endocrine: Negative for cold intolerance, heat intolerance, polydipsia, polyphagia and polyuria.  Genitourinary:  Negative for difficulty urinating, dysuria, flank pain, frequency, hematuria and urgency.  Musculoskeletal:  Negative for arthralgias, gait problem, joint swelling, myalgias and neck pain.  Skin:  Negative for color change, rash and wound.  Neurological:  Negative for dizziness, tremors, speech difficulty, weakness, light-headedness, numbness and headaches.  Hematological:  Does not bruise/bleed easily.  Psychiatric/Behavioral:  Positive for depression. Negative  for agitation, confusion, decreased concentration, sleep disturbance and suicidal ideas.    Per HPI unless specifically indicated above     Objective:    BP 133/84   Pulse 78   Temp 98.7 F (37.1 C) (Oral)   Ht 5' 4.96" (1.65 m)   Wt 175 lb (79.4 kg)    SpO2 96%   BMI 29.16 kg/m   Wt Readings from Last 3 Encounters:  06/22/21 175 lb (79.4 kg)  03/22/21 173 lb 6.4 oz (78.7 kg)  12/22/20 173 lb 6.4 oz (78.7 kg)    Physical Exam Vitals and nursing note reviewed.  Constitutional:      General: She is not in acute distress.    Appearance: Normal appearance. She is not ill-appearing or diaphoretic.  HENT:     Head: Normocephalic and atraumatic.     Right Ear: Tympanic membrane and external ear normal. There is no impacted cerumen.     Left Ear: External ear normal.     Nose: No congestion or rhinorrhea.     Mouth/Throat:     Pharynx: No oropharyngeal exudate or posterior oropharyngeal erythema.  Eyes:     Conjunctiva/sclera: Conjunctivae normal.     Pupils: Pupils are equal, round, and reactive to light.  Cardiovascular:     Rate and Rhythm: Normal rate and regular rhythm.     Heart sounds: No murmur heard.   No friction rub. No gallop.  Pulmonary:     Effort: No respiratory distress.     Breath sounds: No stridor. No wheezing or rhonchi.  Chest:     Chest wall: No tenderness.  Abdominal:     General: Abdomen is flat. Bowel sounds are normal. There is no distension.     Palpations: Abdomen is soft. There is no mass.     Tenderness: There is no abdominal tenderness. There is no guarding.  Musculoskeletal:        General: No swelling or deformity.     Cervical back: Normal range of motion and neck supple. No rigidity or tenderness.     Right lower leg: No edema.     Left lower leg: No edema.  Skin:    General: Skin is warm and dry.     Coloration: Skin is not jaundiced.     Findings: No erythema.  Neurological:     Mental Status: She is alert and oriented to person, place, and time. Mental status is at baseline.  Psychiatric:        Mood and Affect: Mood normal.        Behavior: Behavior normal.        Thought Content: Thought content normal.        Judgment: Judgment normal.          Current Outpatient Medications:     BIOTIN PO, Take by mouth daily., Disp: , Rfl:    docusate sodium (COLACE) 50 MG capsule, Take 50 mg by mouth daily. , Disp: , Rfl:    FLUoxetine (PROZAC) 20 MG capsule, Take 1 tablet (20 mg total) by mouth daily., Disp: 90 capsule, Rfl: 4   fluticasone (FLONASE) 50 MCG/ACT nasal spray, Place 2 sprays into both nostrils daily., Disp: 16 g, Rfl: 6   gabapentin (NEURONTIN) 300 MG capsule, Take 1 capsule (300 mg total) by mouth 2 (two) times daily., Disp: 180 capsule, Rfl: 4   loratadine (CLARITIN) 10 MG tablet, Take 10 mg by mouth daily., Disp: , Rfl:    NAPROXEN PO, Take by mouth  as needed., Disp: , Rfl:    omeprazole (PRILOSEC) 20 MG capsule, Take 1 capsule (20 mg total) by mouth daily., Disp: 90 capsule, Rfl: 4   valACYclovir (VALTREX) 500 MG tablet, Take 1 tablet (500 mg total) by mouth as needed., Disp: 30 tablet, Rfl: 4   Vitamin D, Cholecalciferol, 400 UNITS TABS, Take 1,000 Units by mouth. , Disp: , Rfl:    benzonatate (TESSALON) 100 MG capsule, Take 1 capsule (100 mg total) by mouth 3 (three) times daily as needed for cough. (Patient not taking: Reported on 06/22/2021), Disp: 20 capsule, Rfl: 0   MODERNA COVID-19 VACCINE 100 MCG/0.5ML injection, , Disp: , Rfl:     Assessment & Plan:  Diarrhea ? Sec to Pin worm manifestations.  Will need to use probiotics after every loose stool Will start pt on albendazole x 3 days.  Advised to call the office or go to the ER if she develops further abdominal pain crampign, any new onset of bleeding / black stools or  fresh red blood from any orifice,.  Pt verbalized understanding of such.   2.Fibromyalgia :  Stable , on prozac and gabpentin for such  Chronic.  potential Side effects dw pt. to call office if develops any SE. will fu with me in 1 month for such. pt verbalised understanding.     Problem List Items Addressed This Visit   None    Orders Placed This Encounter  Procedures   Cdiff NAA+O+P+Stool Culture   Fecal leukocytes   MM 3D  SCREEN BREAST BILATERAL   Cologuard   CBC with Differential/Platelet   Comprehensive metabolic panel   Lipid panel   TSH   Urinalysis, Routine w reflex microscopic   Ambulatory referral to Obstetrics / Gynecology     Meds ordered this encounter  Medications   albendazole (ALBENZA) 200 MG tablet    Sig: Take 2 tablets (400 mg total) by mouth 2 (two) times daily for 3 days.    Dispense:  12 tablet    Refill:  0     Follow up plan: No follow-ups on file.  Health Maintenance :  Mammogram : ordered  Paps smear: ordered  Cscope : cologaurd.

## 2021-06-23 LAB — CBC WITH DIFFERENTIAL/PLATELET
Basophils Absolute: 0.1 10*3/uL (ref 0.0–0.2)
Basos: 1 %
EOS (ABSOLUTE): 0.1 10*3/uL (ref 0.0–0.4)
Eos: 2 %
Hematocrit: 43.1 % (ref 34.0–46.6)
Hemoglobin: 13.6 g/dL (ref 11.1–15.9)
Immature Grans (Abs): 0 10*3/uL (ref 0.0–0.1)
Immature Granulocytes: 0 %
Lymphocytes Absolute: 2 10*3/uL (ref 0.7–3.1)
Lymphs: 34 %
MCH: 25.3 pg — ABNORMAL LOW (ref 26.6–33.0)
MCHC: 31.6 g/dL (ref 31.5–35.7)
MCV: 80 fL (ref 79–97)
Monocytes Absolute: 0.5 10*3/uL (ref 0.1–0.9)
Monocytes: 8 %
Neutrophils Absolute: 3.2 10*3/uL (ref 1.4–7.0)
Neutrophils: 55 %
Platelets: 326 10*3/uL (ref 150–450)
RBC: 5.38 x10E6/uL — ABNORMAL HIGH (ref 3.77–5.28)
RDW: 15.9 % — ABNORMAL HIGH (ref 11.7–15.4)
WBC: 5.8 10*3/uL (ref 3.4–10.8)

## 2021-06-23 LAB — COMPREHENSIVE METABOLIC PANEL
ALT: 10 IU/L (ref 0–32)
AST: 16 IU/L (ref 0–40)
Albumin/Globulin Ratio: 1.6 (ref 1.2–2.2)
Albumin: 4.2 g/dL (ref 3.7–4.7)
Alkaline Phosphatase: 70 IU/L (ref 44–121)
BUN/Creatinine Ratio: 17 (ref 12–28)
BUN: 19 mg/dL (ref 8–27)
Bilirubin Total: 0.3 mg/dL (ref 0.0–1.2)
CO2: 23 mmol/L (ref 20–29)
Calcium: 9.5 mg/dL (ref 8.7–10.3)
Chloride: 104 mmol/L (ref 96–106)
Creatinine, Ser: 1.12 mg/dL — ABNORMAL HIGH (ref 0.57–1.00)
Globulin, Total: 2.7 g/dL (ref 1.5–4.5)
Glucose: 96 mg/dL (ref 65–99)
Potassium: 4.7 mmol/L (ref 3.5–5.2)
Sodium: 143 mmol/L (ref 134–144)
Total Protein: 6.9 g/dL (ref 6.0–8.5)
eGFR: 52 mL/min/{1.73_m2} — ABNORMAL LOW (ref >59)

## 2021-06-23 LAB — LIPID PANEL
Chol/HDL Ratio: 5.1 ratio — ABNORMAL HIGH (ref 0.0–4.4)
Cholesterol, Total: 249 mg/dL — ABNORMAL HIGH (ref 100–199)
HDL: 49 mg/dL (ref >39)
LDL Chol Calc (NIH): 177 mg/dL — ABNORMAL HIGH (ref 0–99)
Triglycerides: 127 mg/dL (ref 0–149)
VLDL Cholesterol Cal: 23 mg/dL (ref 5–40)

## 2021-06-23 LAB — TSH: TSH: 4.89 u[IU]/mL — ABNORMAL HIGH (ref 0.450–4.500)

## 2021-06-24 ENCOUNTER — Other Ambulatory Visit: Payer: PPO

## 2021-06-24 ENCOUNTER — Other Ambulatory Visit: Payer: Self-pay

## 2021-06-24 DIAGNOSIS — R197 Diarrhea, unspecified: Secondary | ICD-10-CM | POA: Diagnosis not present

## 2021-06-24 DIAGNOSIS — M797 Fibromyalgia: Secondary | ICD-10-CM | POA: Diagnosis not present

## 2021-06-24 DIAGNOSIS — I10 Essential (primary) hypertension: Secondary | ICD-10-CM | POA: Diagnosis not present

## 2021-06-27 ENCOUNTER — Telehealth: Payer: Self-pay

## 2021-06-27 NOTE — Telephone Encounter (Signed)
Yes please it was for stool samples - and cologaurd is sperate, she has diarrhea were looking for etiology O and P  / culture  Thnx.

## 2021-06-27 NOTE — Telephone Encounter (Signed)
Copied from [place] [id number]. Topic: General - Other >> Jun 27, 2021 11:51 AM Bayard [name] wrote: Reason for ED:[id number] called said received care package in the mail, but says she received one from Dr Neomia Dear at her appt and she was wanting to know if its the same thing as what Dr Neomia Dear gave. Please call back

## 2021-06-27 NOTE — Telephone Encounter (Signed)
Routing to provider to advise. Looks like patient was given things in the office for stool samples/studies and patient should have received a Cologuard kit in the mail. Does she need to complete all of the tests?

## 2021-06-28 LAB — FECAL LEUKOCYTES

## 2021-06-28 LAB — SPECIMEN STATUS REPORT

## 2021-06-28 LAB — FECAL OCCULT BLOOD, IMMUNOCHEMICAL: Fecal Occult Bld: NEGATIVE

## 2021-06-28 NOTE — Telephone Encounter (Signed)
Patient notified that she needs to complete all tests.

## 2021-07-06 ENCOUNTER — Other Ambulatory Visit: Payer: Self-pay | Admitting: Internal Medicine

## 2021-07-06 DIAGNOSIS — Z1239 Encounter for other screening for malignant neoplasm of breast: Secondary | ICD-10-CM

## 2021-07-06 LAB — CDIFF NAA+O+P+STOOL CULTURE
E coli, Shiga toxin Assay: NEGATIVE
Toxigenic C. Difficile by PCR: NEGATIVE

## 2021-07-06 LAB — FECAL LEUKOCYTES

## 2021-07-06 NOTE — Progress Notes (Signed)
Please let Toye know that stool results are negative for infection.  Thanks!!  Have a great day!!

## 2021-07-12 ENCOUNTER — Encounter: Payer: Self-pay | Admitting: Obstetrics and Gynecology

## 2021-07-14 DIAGNOSIS — Z1211 Encounter for screening for malignant neoplasm of colon: Secondary | ICD-10-CM | POA: Diagnosis not present

## 2021-07-26 ENCOUNTER — Other Ambulatory Visit: Payer: Self-pay

## 2021-07-26 ENCOUNTER — Encounter: Payer: Self-pay | Admitting: Family Medicine

## 2021-07-26 ENCOUNTER — Ambulatory Visit
Admission: RE | Admit: 2021-07-26 | Discharge: 2021-07-26 | Disposition: A | Discharge status: Home / Self Care | Payer: PPO | Source: Ambulatory Visit | Attending: Internal Medicine | Admitting: Internal Medicine

## 2021-07-26 DIAGNOSIS — Z1231 Encounter for screening mammogram for malignant neoplasm of breast: Secondary | ICD-10-CM | POA: Insufficient documentation

## 2021-07-26 DIAGNOSIS — Z1239 Encounter for other screening for malignant neoplasm of breast: Secondary | ICD-10-CM

## 2021-07-26 LAB — COLOGUARD: Cologuard: NEGATIVE

## 2021-08-03 ENCOUNTER — Ambulatory Visit: Payer: PPO | Admitting: Internal Medicine

## 2021-09-08 ENCOUNTER — Other Ambulatory Visit: Payer: Self-pay

## 2021-09-08 ENCOUNTER — Ambulatory Visit (INDEPENDENT_AMBULATORY_CARE_PROVIDER_SITE_OTHER): Payer: PPO | Admitting: Internal Medicine

## 2021-09-08 ENCOUNTER — Encounter: Payer: Self-pay | Admitting: Internal Medicine

## 2021-09-08 VITALS — BP 124/82 | HR 68 | Temp 97.9°F | Ht 64.96 in | Wt 175.6 lb

## 2021-09-08 DIAGNOSIS — R7989 Other specified abnormal findings of blood chemistry: Secondary | ICD-10-CM

## 2021-09-08 DIAGNOSIS — E039 Hypothyroidism, unspecified: Secondary | ICD-10-CM | POA: Insufficient documentation

## 2021-09-08 DIAGNOSIS — E785 Hyperlipidemia, unspecified: Secondary | ICD-10-CM | POA: Diagnosis not present

## 2021-09-08 MED ORDER — EZETIMIBE 10 MG PO TABS: 10.0000 mg | ORAL_TABLET | Freq: Every day | ORAL | 3 refills | Status: DC

## 2021-09-08 NOTE — Progress Notes (Signed)
BP 124/82   Pulse 68   Temp 97.9 F (36.6 C) (Oral)   Ht 5' 4.96" (1.65 m)   Wt 175 lb 9.6 oz (79.7 kg)   SpO2 98%   BMI 29.26 kg/m    Subjective:    Patient ID: Angela Martinez, female    DOB: November 21, 1950, 70 y.o.   MRN: [id number]  Chief Complaint  Patient presents with   Depression   Hypertension   Hyperlipidemia   Knee Pain    Right knee pain a few days ago after she mowed    HPI: Angela Martinez is a 70 y.o. female  Pt I shere for a fu, she couldn't take her albendazole as rx she had nausea from the first 2 doses and stopped taking the pills. She hasnt had any more diarrhea, no itching around the anal area like she had in the past.   Depression        This is a chronic problem.  The current episode started 1 to 4 weeks ago.   Associated symptoms include no decreased concentration, no fatigue, no helplessness, no hopelessness, not irritable, no restlessness, no decreased interest and no myalgias. Hyperlipidemia This is a chronic (has been eating an unhealthy fat rich diet.) problem. Episode onset: TG very high. The problem is uncontrolled. Pertinent negatives include no focal weakness or myalgias.  Knee Pain  Incident onset: chronic, stable.ha sa ho fibromyalgia. The pain has been Intermittent since onset.   Chief Complaint  Patient presents with   Depression   Hypertension   Hyperlipidemia   Knee Pain    Right knee pain a few days ago after she mowed    Relevant past medical, surgical, family and social history reviewed and updated as indicated. Interim medical history since our last visit reviewed. Allergies and medications reviewed and updated.  Review of Systems  Constitutional:  Negative for fatigue.  Musculoskeletal:  Negative for myalgias.  Neurological:  Negative for focal weakness.  Psychiatric/Behavioral:  Positive for depression. Negative for decreased concentration.    Per HPI unless specifically indicated above     Objective:    BP 124/82   Pulse  68   Temp 97.9 F (36.6 C) (Oral)   Ht 5' 4.96" (1.65 m)   Wt 175 lb 9.6 oz (79.7 kg)   SpO2 98%   BMI 29.26 kg/m   Wt Readings from Last 3 Encounters:  09/08/21 175 lb 9.6 oz (79.7 kg)  06/22/21 175 lb (79.4 kg)  03/22/21 173 lb 6.4 oz (78.7 kg)    Physical Exam Vitals and nursing note reviewed.  Constitutional:      General: She is not irritable.She is not in acute distress.    Appearance: Normal appearance. She is not ill-appearing or diaphoretic.  Eyes:     Conjunctiva/sclera: Conjunctivae normal.  Cardiovascular:     Rate and Rhythm: Normal rate and regular rhythm.  Pulmonary:     Effort: Pulmonary effort is normal.     Breath sounds: Normal breath sounds. No rhonchi.  Abdominal:     General: Abdomen is flat. Bowel sounds are normal. There is no distension.     Palpations: Abdomen is soft. There is no mass.     Tenderness: There is no abdominal tenderness. There is no guarding.  Skin:    General: Skin is warm and dry.     Coloration: Skin is not jaundiced.     Findings: No erythema.  Neurological:     Mental Status: She  is alert.    Results for orders placed or performed in visit on 06/24/21  Fecal leukocytes   Specimen: Stool   STO  Result Value Ref Range   White Blood Cells (WBC), Stool CANCELED         Current Outpatient Medications:    BIOTIN PO, Take by mouth daily., Disp: , Rfl:    ezetimibe (ZETIA) 10 MG tablet, Take 1 tablet (10 mg total) by mouth daily., Disp: 30 tablet, Rfl: 3   FLUoxetine (PROZAC) 20 MG capsule, Take 1 tablet (20 mg total) by mouth daily., Disp: 90 capsule, Rfl: 4   fluticasone (FLONASE) 50 MCG/ACT nasal spray, Place 2 sprays into both nostrils daily., Disp: 16 g, Rfl: 6   gabapentin (NEURONTIN) 300 MG capsule, Take 1 capsule (300 mg total) by mouth 2 (two) times daily., Disp: 180 capsule, Rfl: 4   loratadine (CLARITIN) 10 MG tablet, Take 10 mg by mouth daily., Disp: , Rfl:    NAPROXEN PO, Take by mouth as needed., Disp: , Rfl:     omeprazole (PRILOSEC) 20 MG capsule, Take 1 capsule (20 mg total) by mouth daily., Disp: 90 capsule, Rfl: 4   valACYclovir (VALTREX) 500 MG tablet, Take 1 tablet (500 mg total) by mouth as needed., Disp: 30 tablet, Rfl: 4   Vitamin D, Cholecalciferol, 400 UNITS TABS, Take 1,000 Units by mouth. , Disp: , Rfl:    MODERNA COVID-19 VACCINE 100 MCG/0.5ML injection, , Disp: , Rfl:     Assessment & Plan:  HLD : Will need to add lipitor 40 mg.  recheck FLP, check LFT's work on diet, SE of meds explained to pt. low fat and high fiber diet explained to pt.   Ref. Range 12/22/2020 14:05 06/22/2021 09:24  Total CHOL/HDL Ratio Latest Ref Range: 0.0 - 4.4 ratio  5.1 (H)  Cholesterol, Total Latest Ref Range: 100 - 199 mg/dL 279 (H) 249 (H)  HDL Cholesterol Latest Ref Range: >39 mg/dL 46 49  Triglycerides Latest Ref Range: 0 - 149 mg/dL 143 127  VLDL Cholesterol Cal Latest Ref Range: 5 - 40 mg/dL 27 23  LDL Chol Calc (NIH) Latest Ref Range: 0 - 99 mg/dL 206 (H) 177 (H)   2. Elevated creatinine: increase to 64 oz a day Will need to increase water intake.   3. Fibromyalgia :  Stable , on prozac and gabpentin for such  Chronic.  potential Side effects dw pt. to call office if develops any SE. will fu with me in 1 month for such. pt verbalised understanding.   4. Diarrhea took 2 albendazole , had nausea. Itching.   Problem List Items Addressed This Visit       Endocrine   Hypothyroidism   Relevant Orders   TSH   T4, free     Other   Hyperlipidemia - Primary   Relevant Medications   ezetimibe (ZETIA) 10 MG tablet   Other Relevant Orders   AMB Referral to [facility]     Orders Placed This Encounter  Procedures   TSH   T4, free   AMB Referral to [facility] ordered this encounter  Medications   ezetimibe (ZETIA) 10 MG tablet    Sig: Take 1 tablet (10 mg total) by mouth daily.    Dispense:  30 tablet    Refill:  3     Follow up plan: No  follow-ups on file.

## 2021-09-08 NOTE — Patient Instructions (Addendum)
increase to 64 oz a day Will need to increase water intake. High Cholesterol High cholesterol is a condition in which the blood has high levels of a white, waxy substance similar to fat (cholesterol). The liver makes all the cholesterol that the body needs. The human body needs small amounts of cholesterol to help build cells. A person gets extra or excess cholesterol from the food that he or she eats. The blood carries cholesterol from the liver to the rest of the body. If you have high cholesterol, deposits (plaques) may build up on the walls of your arteries. Arteries are the blood vessels that carry blood away from your heart. These plaques make the arteries narrow and stiff. Cholesterol plaques increase your risk for heart attack and stroke. Work with your health care provider to keep your cholesterol levels in a healthy range. What increases the risk? The following factors may make you more likely to develop this condition: Eating foods that are high in animal fat (saturated fat) or cholesterol. Being overweight. Not getting enough exercise. A family history of high cholesterol (familial hypercholesterolemia). Use of tobacco products. Having diabetes. What are the signs or symptoms? In most cases, high cholesterol does not usually cause any symptoms. In severe cases, very high cholesterol levels can cause: Fatty bumps under the skin (xanthomas). A white or gray ring around the black center (pupil) of the eye. How is this diagnosed? This condition may be diagnosed based on the results of a blood test. If you are older than 72 years of age, your health care provider may check your cholesterol levels every 4-6 years. You may be checked more often if you have high cholesterol or other risk factors for heart disease. The blood test for cholesterol measures: "Bad" cholesterol, or LDL cholesterol. This is the main type of cholesterol that causes heart disease. The desired level is less than 100  mg/dL (2.59 mmol/L). "Good" cholesterol, or HDL cholesterol. HDL helps protect against heart disease by cleaning the arteries and carrying the LDL to the liver for processing. The desired level for HDL is 60 mg/dL (1.55 mmol/L) or higher. Triglycerides. These are fats that your body can store or burn for energy. The desired level is less than 150 mg/dL (1.69 mmol/L). Total cholesterol. This measures the total amount of cholesterol in your blood and includes LDL, HDL, and triglycerides. The desired level is less than 200 mg/dL (5.17 mmol/L). How is this treated? Treatment for high cholesterol starts with lifestyle changes, such as diet and exercise. Diet changes. You may be asked to eat foods that have more fiber and less saturated fats or added sugar. Lifestyle changes. These may include regular exercise, maintaining a healthy weight, and quitting use of tobacco products. Medicines. These are given when diet and lifestyle changes have not worked. You may be prescribed a statin medicine to help lower your cholesterol levels. Follow these instructions at home: Eating and drinking  Eat a healthy, balanced diet. This diet includes: Daily servings of a variety of fresh, frozen, or canned fruits and vegetables. Daily servings of whole grain foods that are rich in fiber. Foods that are low in saturated fats and trans fats. These include poultry and fish without skin, lean cuts of meat, and low-fat dairy products. A variety of fish, especially oily fish that contain omega-3 fatty acids. Aim to eat fish at least 2 times a week. Avoid foods and drinks that have added sugar. Use healthy cooking methods, such as roasting, grilling, broiling, baking,  poaching, steaming, and stir-frying. Do not fry your food except for stir-frying. If you drink alcohol: Limit how much you have to: 0-1 drink a day for women who are not pregnant. 0-2 drinks a day for men. Know how much alcohol is in a drink. In the [place] one  drink equals one 12 oz bottle of beer (355 mL), one 5 oz glass of wine (148 mL), or one 1 oz glass of hard liquor (44 mL). Lifestyle  Get regular exercise. Aim to exercise for a total of 150 minutes a week. Increase your activity level by doing activities such as gardening, walking, and taking the stairs. Do not use any products that contain nicotine or tobacco. These products include cigarettes, chewing tobacco, and vaping devices, such as e-cigarettes. If you need help quitting, ask your health care provider. General instructions Take over-the-counter and prescription medicines only as told by your health care provider. Keep all follow-up visits. This is important. Where to find more information American Heart Association: www.heart.org National Heart, Lung, and Blood Institute: https://wilson-eaton.com/ Contact a health care provider if: You have trouble achieving or maintaining a healthy diet or weight. You are starting an exercise program. You are unable to stop smoking. Get help right away if: You have chest pain. You have trouble breathing. You have discomfort or pain in your jaw, neck, back, shoulder, or arm. You have any symptoms of a stroke. "BE FAST" is an easy way to remember the main warning signs of a stroke: B - Balance. Signs are dizziness, sudden trouble walking, or loss of balance. E - Eyes. Signs are trouble seeing or a sudden change in vision. F - Face. Signs are sudden weakness or numbness of the face, or the face or eyelid drooping on one side. A - Arms. Signs are weakness or numbness in an arm. This happens suddenly and usually on one side of the body. S - Speech. Signs are sudden trouble speaking, slurred speech, or trouble understanding what people say. T - Time. Time to call emergency services. Write down what time symptoms started. You have other signs of a stroke, such as: A sudden, severe headache with no known cause. Nausea or vomiting. Seizure. These symptoms may  represent a serious problem that is an emergency. Do not wait to see if the symptoms will go away. Get medical help right away. Call your local emergency services (911 in the [place] Do not drive yourself to the hospital. Summary Cholesterol plaques increase your risk for heart attack and stroke. Work with your health care provider to keep your cholesterol levels in a healthy range. Eat a healthy, balanced diet, get regular exercise, and maintain a healthy weight. Do not use any products that contain nicotine or tobacco. These products include cigarettes, chewing tobacco, and vaping devices, such as e-cigarettes. Get help right away if you have any symptoms of a stroke. This information is not intended to replace advice given to you by your health care provider. Make sure you discuss any questions you have with your health care provider. Document Revised: 01/20/2021 Document Reviewed: 01/10/2021 Elsevier Patient Education  2022 [employer].

## 2021-09-09 ENCOUNTER — Ambulatory Visit (INDEPENDENT_AMBULATORY_CARE_PROVIDER_SITE_OTHER): Payer: PPO

## 2021-09-09 DIAGNOSIS — E785 Hyperlipidemia, unspecified: Secondary | ICD-10-CM

## 2021-09-09 DIAGNOSIS — M159 Polyosteoarthritis, unspecified: Secondary | ICD-10-CM

## 2021-09-09 DIAGNOSIS — F320 Major depressive disorder, single episode, mild: Secondary | ICD-10-CM

## 2021-09-09 DIAGNOSIS — I10 Essential (primary) hypertension: Secondary | ICD-10-CM

## 2021-09-09 DIAGNOSIS — M797 Fibromyalgia: Secondary | ICD-10-CM

## 2021-09-09 LAB — CBC WITH DIFFERENTIAL/PLATELET
Basophils Absolute: 0.1 10*3/uL (ref 0.0–0.2)
Basos: 1 %
EOS (ABSOLUTE): 0.2 10*3/uL (ref 0.0–0.4)
Eos: 3 %
Hematocrit: 42.9 % (ref 34.0–46.6)
Hemoglobin: 13.8 g/dL (ref 11.1–15.9)
Immature Grans (Abs): 0 10*3/uL (ref 0.0–0.1)
Immature Granulocytes: 0 %
Lymphocytes Absolute: 2 10*3/uL (ref 0.7–3.1)
Lymphs: 37 %
MCH: 25.4 pg — ABNORMAL LOW (ref 26.6–33.0)
MCHC: 32.2 g/dL (ref 31.5–35.7)
MCV: 79 fL (ref 79–97)
Monocytes Absolute: 0.4 10*3/uL (ref 0.1–0.9)
Monocytes: 7 %
Neutrophils Absolute: 2.9 10*3/uL (ref 1.4–7.0)
Neutrophils: 52 %
Platelets: 332 10*3/uL (ref 150–450)
RBC: 5.44 x10E6/uL — ABNORMAL HIGH (ref 3.77–5.28)
RDW: 15.9 % — ABNORMAL HIGH (ref 11.7–15.4)
WBC: 5.5 10*3/uL (ref 3.4–10.8)

## 2021-09-09 LAB — COMPREHENSIVE METABOLIC PANEL
ALT: 8 IU/L (ref 0–32)
AST: 15 IU/L (ref 0–40)
Albumin/Globulin Ratio: 1.7 (ref 1.2–2.2)
Albumin: 4.1 g/dL (ref 3.7–4.7)
Alkaline Phosphatase: 68 IU/L (ref 44–121)
BUN/Creatinine Ratio: 16 (ref 12–28)
BUN: 16 mg/dL (ref 8–27)
Bilirubin Total: 0.3 mg/dL (ref 0.0–1.2)
CO2: 23 mmol/L (ref 20–29)
Calcium: 9.5 mg/dL (ref 8.7–10.3)
Chloride: 106 mmol/L (ref 96–106)
Creatinine, Ser: 1 mg/dL (ref 0.57–1.00)
Globulin, Total: 2.4 g/dL (ref 1.5–4.5)
Glucose: 87 mg/dL (ref 70–99)
Potassium: 4.8 mmol/L (ref 3.5–5.2)
Sodium: 144 mmol/L (ref 134–144)
Total Protein: 6.5 g/dL (ref 6.0–8.5)
eGFR: 60 mL/min/{1.73_m2} (ref >59)

## 2021-09-09 LAB — LIPID PANEL
Chol/HDL Ratio: 5.2 ratio — ABNORMAL HIGH (ref 0.0–4.4)
Cholesterol, Total: 239 mg/dL — ABNORMAL HIGH (ref 100–199)
HDL: 46 mg/dL (ref >39)
LDL Chol Calc (NIH): 168 mg/dL — ABNORMAL HIGH (ref 0–99)
Triglycerides: 137 mg/dL (ref 0–149)
VLDL Cholesterol Cal: 25 mg/dL (ref 5–40)

## 2021-09-09 LAB — TSH: TSH: 5.96 u[IU]/mL — ABNORMAL HIGH (ref 0.450–4.500)

## 2021-09-09 LAB — T4, FREE: Free T4: 0.91 ng/dL (ref 0.82–1.77)

## 2021-09-09 NOTE — Patient Instructions (Signed)
Visit Information   PATIENT GOALS:   Goals Addressed             This Visit's Progress    RNCM: Cope with Pain-Osteoarthritis       Follow Up Date 10/12/2021    - join a support group - learn how to meditate - learn relaxation techniques - practice acceptance of chronic pain - practice relaxation or meditation daily - spend time with positive people - tell myself I can (not I can't) - think of new ways to do favorite things - use distraction techniques    Why is this important?   Living with joint pain and enjoying your life may be hard.  Feelings like depression or anger can make your pain worse.  Learning ways to cope may help you find some relief from the pain.    09-09-2021: The patient states her pain varies daily. The patient rates her pain level at a 4 today on a scale of0-10. She does not let the pain stop her from doing what she needs to do.  She is very active. The patient loves to [place] and garden.      RNCM: Track and Manage My Symptoms-Depression       Timeframe:  Long-Range Goal Priority:  High Start Date:       09-09-2021                      Expected End Date:    09-09-2022                   Follow Up Date 10/12/2021    - avoid negative self-talk - develop a personal safety plan - develop a plan to deal with triggers like holidays, anniversaries - exercise at least 2 to 3 times per week - have a plan for how to handle bad days - journal feelings and what helps to feel better or worse - spend time or talk with others at least 2 to 3 times per week - spend time or talk with others every day - watch for early signs of feeling worse - write in journal every day    Why is this important?   Keeping track of your progress will help your treatment team find the right mix of medicine and therapy for you.  Write in your journal every day.  Day-to-day changes in depression symptoms are normal. It may be more helpful to check your progress at the end of each week  instead of every day.     09-09-2021: The patient deals with her depression well. She is very active and writes in her journal and prays a lot for strength. Denies any acute distress today.          CLINICAL CARE PLAN: Patient Care Plan: RNCM: General Plan of Care (Adult) for Chronic Disease Management and Care Coordination Needs     Problem Identified: RNCM: Development of Plan of Care for Chronic Disease Managment and Care Coordination needs (HTN, HLD, Fibromyalgia, Hypothyroidism, Depression)   Priority: High     Long-Range Goal: RNCM: Development of Plan of Care for Chronic Disease Managment and Care Coordination needs (HTN, HLD, Fibromyalgia, Hypothyroidism, Depression   Start Date: 09/09/2021  Expected End Date: 09/09/2022  Priority: High  Note:   Current Barriers:  Knowledge Deficits related to plan of care for management of HTN, HLD, Depression: depressed mood Grief and loss of a child, grief over not being able to work and having  to retire early, and fibromyalgia and chronic pain   Chronic Disease Management support and education needs related to HTN, HLD, Depression: depressed mood grief, and fibromyalgia and chronic pain  RNCM Clinical Goal(s):  Patient will verbalize understanding of plan for management of HTN, HLD, Depression, and Fibromyalgia and chronic pain  verbalize basic understanding of HTN, HLD, Depression, and Chronic pain and fibromyalgia disease process and self health management plan   take all medications exactly as prescribed and will call provider for medication related questions demonstrate understanding of rationale for each prescribed medication   attend all scheduled medical appointments: 10-20-2021 at 0820 am demonstrate improved and ongoing health management independence   demonstrate a decrease in HTN, HLD, Depression, and chronic pain and fibromyalgia exacerbations   demonstrate ongoing self health care management ability by effective management  of chronic conditions   through collaboration with RN Care manager, provider, and care team.   Interventions: 1:1 collaboration with primary care provider regarding development and update of comprehensive plan of care as evidenced by provider attestation and co-signature Inter-disciplinary care team collaboration (see longitudinal plan of care) Evaluation of current treatment plan related to  self management and patient's adherence to plan as established by provider   SDOH Barriers (Status: Goal on track: YES.)  Patient interviewed and SDOH assessment performed        SDOH Interventions    Flowsheet Row Most Recent Value  SDOH Interventions   Food Insecurity Interventions Intervention Not Indicated  Financial Strain Interventions Intervention Not Indicated  Housing Interventions Intervention Not Indicated  Intimate Partner Violence Interventions Intervention Not Indicated  Physical Activity Interventions Intervention Not Indicated, Other (Comments)  [Active in her garden and yard work and does a lot of baking]  Stress Interventions Intervention Not Indicated  Social Connections Interventions Intervention Not Indicated  Transportation Interventions Intervention Not Indicated     Patient interviewed and appropriate assessments performed Provided patient with information about resources available for working with the CCM team Discussed plans with patient for ongoing care management follow up and provided patient with direct contact information for care management team Advised patient to call the office for changes in SDOH, questions, or concerns    Depression  (Status: New goal. Goal on track: YES.) Evaluation of current treatment plan related to Depression,  self-management and patient's adherence to plan as established by provider. Discussed plans with patient for ongoing care management follow up and provided patient with direct contact information for care management team Advised  patient to call the office for changes in mood, anxiety, or depression; Provided education to patient re: CCM team available to assist in the management of her chronic conditions and help her maintain her health and well being ; Reviewed medications with patient and discussed compliance ; Provided patient with grief and depression  educational materials related to depression and grief ; Reviewed scheduled/upcoming provider appointments including 10-20-2021 at 820 am; Discussed plans with patient for ongoing care management follow up and provided patient with direct contact information for care management team; Advised patient to discuss about the fatty "lumps" she has all over her body with provider; Screening for signs and symptoms of depression related to chronic disease state;  Assessed social determinant of health barriers;   Hyperlipidemia:  (Status: New goal. Goal on track: NO.) Lab Results  Component Value Date   CHOL 239 (H) 09/08/2021   HDL 46 09/08/2021   LDLCALC 168 (H) 09/08/2021   TRIG 137 09/08/2021   CHOLHDL 5.2 (H) 09/08/2021  Medication review performed; medication list updated in electronic medical record.  Provider established cholesterol goals reviewed; Counseled on importance of regular laboratory monitoring as prescribed; Provided HLD educational materials; Reviewed role and benefits of statin for ASCVD risk reduction; Discussed strategies to manage statin-induced myalgias; Reviewed importance of limiting foods high in cholesterol; Reviewed exercise goals and target of 150 minutes per week;  Hypertension: (Status: New goal. Goal on track: YES.) Last practice recorded BP readings:  BP Readings from Last 3 Encounters:  09/08/21 124/82  06/22/21 133/84  03/22/21 112/72  Most recent eGFR/CrCl:  Lab Results  Component Value Date   EGFR 60 09/08/2021    No components found for: CRCL  Evaluation of current treatment plan related to hypertension self  management and patient's adherence to plan as established by provider;   Provided education to patient re: stroke prevention, s/s of heart attack and stroke; Reviewed prescribed diet heart healthy diet  Reviewed medications with patient and discussed importance of compliance;  Discussed plans with patient for ongoing care management follow up and provided patient with direct contact information for care management team; Advised patient, providing education and rationale, to monitor blood pressure daily and record, calling PCP for findings outside established parameters;  Reviewed scheduled/upcoming provider appointments including:  Provided education on prescribed diet heart healthy;  Discussed complications of poorly controlled blood pressure such as heart disease, stroke, circulatory complications, vision complications, kidney impairment, sexual dysfunction;   Pain and fibromyalgia:  (Status: New goal. Goal on track: YES.) Pain assessment performed Medications reviewed Reviewed provider established plan for pain management; Discussed importance of adherence to all scheduled medical appointments; Counseled on the importance of reporting any/all new or changed pain symptoms or management strategies to pain management provider; Advised patient to report to care team affect of pain on daily activities; Discussed use of relaxation techniques and/or diversional activities to assist with pain reduction (distraction, imagery, relaxation, massage, acupressure, TENS, heat, and cold application; Reviewed with patient prescribed pharmacological and nonpharmacological pain relief strategies; Advised patient to discuss changes in level or intensity of pain, or unresolved pain with provider;  Patient Goals/Self-Care Activities: Patient will self administer medications as prescribed as evidenced by self report/primary caregiver report  Patient will attend all scheduled provider appointments as evidenced by  clinician review of documented attendance to scheduled appointments and patient/caregiver report Patient will call pharmacy for medication refills as evidenced by patient report and review of pharmacy fill history as appropriate Patient will attend church or other social activities as evidenced by patient report Patient will continue to perform ADL's independently as evidenced by patient/caregiver report Patient will continue to perform IADL's independently as evidenced by patient/caregiver report Patient will call provider office for new concerns or questions as evidenced by review of documented incoming telephone call notes and patient report Patient will work with BSW to address care coordination needs and will continue to work with the clinical team to address health care and disease management related needs as evidenced by documented adherence to scheduled care management/care coordination appointments - check blood pressure weekly - choose a place to take my blood pressure (home, clinic or office, retail store) - write blood pressure results in a log or diary - learn about high blood pressure - keep a blood pressure log - take blood pressure log to all doctor appointments - call doctor for signs and symptoms of high blood pressure - develop an action plan for high blood pressure - keep all doctor appointments - take medications  for blood pressure exactly as prescribed - report new symptoms to your doctor - eat more whole grains, fruits and vegetables, lean meats and healthy fats - call for medicine refill 2 or 3 days before it runs out - take all medications exactly as prescribed - call doctor with any symptoms you believe are related to your medicine - call doctor when you experience any new symptoms - go to all doctor appointments as scheduled - adhere to prescribed diet: heart healthy       Consent to CCM Services: Ms. Curless was given information about Chronic Care Management  services including:  CCM service includes personalized support from designated clinical staff supervised by her physician, including individualized plan of care and coordination with other care providers 24/7 contact phone numbers for assistance for urgent and routine care needs. Service will only be billed when office clinical staff spend 20 minutes or more in a month to coordinate care. Only one practitioner may furnish and bill the service in a calendar month. The patient may stop CCM services at any time (effective at the end of the month) by phone call to the office staff. The patient will be responsible for cost sharing (co-pay) of up to 20% of the service fee (after annual deductible is met).  Patient agreed to services and verbal consent obtained.   The patient verbalized understanding of instructions, educational materials, and care plan provided today and declined offer to receive copy of patient instructions, educational materials, and care plan.   Telephone follow up appointment with care management team member scheduled for: 10-12-2021 at [place] am  Noreene Larsson RN, MSN, [facility] Mobile: [phone]

## 2021-09-09 NOTE — Progress Notes (Signed)
This encounter was created in error - please disregard.

## 2021-09-09 NOTE — Chronic Care Management (AMB) (Signed)
Chronic Care Management   CCM RN Visit Note  09/09/2021 Name: Angela Martinez MRN: [id number] DOB: [birth year]  Subjective: Angela Martinez is a 70 y.o. year old female who is a primary care patient of [name], [name], MD. The care management team was consulted for assistance with disease management and care coordination needs.    Engaged with patient by telephone for initial visit in response to provider referral for case management and/or care coordination services.   Consent to Services:  The patient was given the following information about Chronic Care Management services today, agreed to services, and gave verbal consent: 1. CCM service includes personalized support from designated clinical staff supervised by the primary care provider, including individualized plan of care and coordination with other care providers 2. 24/7 contact phone numbers for assistance for urgent and routine care needs. 3. Service will only be billed when office clinical staff spend 20 minutes or more in a month to coordinate care. 4. Only one practitioner may furnish and bill the service in a calendar month. 5.The patient may stop CCM services at any time (effective at the end of the month) by phone call to the office staff. 6. The patient will be responsible for cost sharing (co-pay) of up to 20% of the service fee (after annual deductible is met). Patient agreed to services and consent obtained.  Patient agreed to services and verbal consent obtained.   Assessment: Review of patient past medical history, allergies, medications, health status, including review of consultants reports, laboratory and other test data, was performed as part of comprehensive evaluation and provision of chronic care management services.   SDOH (Social Determinants of Health) assessments and interventions performed:  SDOH Interventions    Flowsheet Row Most Recent Value  SDOH Interventions   Food Insecurity Interventions Intervention Not  Indicated  Financial Strain Interventions Intervention Not Indicated  Housing Interventions Intervention Not Indicated  Intimate Partner Violence Interventions Intervention Not Indicated  Physical Activity Interventions Intervention Not Indicated, Other (Comments)  [Active in her garden and yard work and does a lot of baking]  Stress Interventions Intervention Not Indicated  Social Connections Interventions Intervention Not Indicated  Transportation Interventions Intervention Not Indicated        CCM Care Plan  Allergies  Allergen Reactions   Atorvastatin     Muscle cramps   Crestor [Rosuvastatin Calcium]     Muscle aches   Elemental Sulfur    Lyrica [Pregabalin] Other (See Comments)    Spaced out   Amoxicillin-Pot Clavulanate Nausea Only    Outpatient Encounter Medications as of 09/09/2021  Medication Sig Note   BIOTIN PO Take by mouth daily.    ezetimibe (ZETIA) 10 MG tablet Take 1 tablet (10 mg total) by mouth daily.    FLUoxetine (PROZAC) 20 MG capsule Take 1 tablet (20 mg total) by mouth daily.    fluticasone (FLONASE) 50 MCG/ACT nasal spray Place 2 sprays into both nostrils daily. 10/15/2019: As needed   gabapentin (NEURONTIN) 300 MG capsule Take 1 capsule (300 mg total) by mouth 2 (two) times daily.    loratadine (CLARITIN) 10 MG tablet Take 10 mg by mouth daily.    MODERNA COVID-19 VACCINE 100 MCG/0.5ML injection     NAPROXEN PO Take by mouth as needed.    omeprazole (PRILOSEC) 20 MG capsule Take 1 capsule (20 mg total) by mouth daily.    valACYclovir (VALTREX) 500 MG tablet Take 1 tablet (500 mg total) by mouth as needed.    Vitamin  D, Cholecalciferol, 400 UNITS TABS Take 1,000 Units by mouth.     No facility-administered encounter medications on file as of 09/09/2021.    Patient Active Problem List   Diagnosis Date Noted   Hypothyroidism 09/08/2021   Encounter for screening for malignant neoplasm of breast 06/22/2021   Diarrhea 06/22/2021   Myalgia due to  statin 12/22/2020   Vitamin D deficiency 10/15/2019   Eczema 08/02/2018   Advanced care planning/counseling discussion 08/02/2017   Trigeminal neuralgia of left side of face 12/27/2015   Fibromyalgia    OA (osteoarthritis)    Osteoporosis    Hyperlipidemia    Essential hypertension    Obesity    GERD (gastroesophageal reflux disease)    Depression, major, single episode, mild (HCC)     Conditions to be addressed/monitored:HTN, HLD, Depression, and Chronic pain and fibromyalgia   Care Plan : RNCM: General Plan of Care (Adult) for Chronic Disease Management and Care Coordination Needs  Updates made by Vanita Ingles, RN since 09/09/2021 12:00 AM     Problem: RNCM: Development of Plan of Care for Chronic Disease Managment and Care Coordination needs (HTN, HLD, Fibromyalgia, Hypothyroidism, Depression)   Priority: High     Long-Range Goal: RNCM: Development of Plan of Care for Chronic Disease Managment and Care Coordination needs (HTN, HLD, Fibromyalgia, Hypothyroidism, Depression   Start Date: 09/09/2021  Expected End Date: 09/09/2022  Priority: High  Note:   Current Barriers:  Knowledge Deficits related to plan of care for management of HTN, HLD, Depression: depressed mood Grief and loss of a child, grief over not being able to work and having to retire early, and fibromyalgia and chronic pain   Chronic Disease Management support and education needs related to HTN, HLD, Depression: depressed mood grief, and fibromyalgia and chronic pain  RNCM Clinical Goal(s):  Patient will verbalize understanding of plan for management of HTN, HLD, Depression, and Fibromyalgia and chronic pain  verbalize basic understanding of HTN, HLD, Depression, and Chronic pain and fibromyalgia disease process and self health management plan   take all medications exactly as prescribed and will call provider for medication related questions demonstrate understanding of rationale for each prescribed  medication   attend all scheduled medical appointments: 10-20-2021 at 0820 am demonstrate improved and ongoing health management independence   demonstrate a decrease in HTN, HLD, Depression, and chronic pain and fibromyalgia exacerbations   demonstrate ongoing self health care management ability by effective management of chronic conditions   through collaboration with RN Care manager, provider, and care team.   Interventions: 1:1 collaboration with primary care provider regarding development and update of comprehensive plan of care as evidenced by provider attestation and co-signature Inter-disciplinary care team collaboration (see longitudinal plan of care) Evaluation of current treatment plan related to  self management and patient's adherence to plan as established by provider   SDOH Barriers (Status: Goal on track: YES.)  Patient interviewed and SDOH assessment performed        SDOH Interventions    Flowsheet Row Most Recent Value  SDOH Interventions   Food Insecurity Interventions Intervention Not Indicated  Financial Strain Interventions Intervention Not Indicated  Housing Interventions Intervention Not Indicated  Intimate Partner Violence Interventions Intervention Not Indicated  Physical Activity Interventions Intervention Not Indicated, Other (Comments)  [Active in her garden and yard work and does a lot of baking]  Stress Interventions Intervention Not Indicated  Social Connections Interventions Intervention Not Indicated  Transportation Interventions Intervention Not Indicated  Patient interviewed and appropriate assessments performed Provided patient with information about resources available for working with the CCM team Discussed plans with patient for ongoing care management follow up and provided patient with direct contact information for care management team Advised patient to call the office for changes in SDOH, questions, or concerns    Depression  (Status:  New goal. Goal on track: YES.) Evaluation of current treatment plan related to Depression,  self-management and patient's adherence to plan as established by provider. Discussed plans with patient for ongoing care management follow up and provided patient with direct contact information for care management team Advised patient to call the office for changes in mood, anxiety, or depression; Provided education to patient re: CCM team available to assist in the management of her chronic conditions and help her maintain her health and well being ; Reviewed medications with patient and discussed compliance ; Provided patient with grief and depression  educational materials related to depression and grief ; Reviewed scheduled/upcoming provider appointments including 10-20-2021 at 820 am; Discussed plans with patient for ongoing care management follow up and provided patient with direct contact information for care management team; Advised patient to discuss about the fatty "lumps" she has all over her body with provider; Screening for signs and symptoms of depression related to chronic disease state;  Assessed social determinant of health barriers;   Hyperlipidemia:  (Status: New goal. Goal on track: NO.) Lab Results  Component Value Date   CHOL 239 (H) 09/08/2021   HDL 46 09/08/2021   LDLCALC 168 (H) 09/08/2021   TRIG 137 09/08/2021   CHOLHDL 5.2 (H) 09/08/2021     Medication review performed; medication list updated in electronic medical record.  Provider established cholesterol goals reviewed; Counseled on importance of regular laboratory monitoring as prescribed; Provided HLD educational materials; Reviewed role and benefits of statin for ASCVD risk reduction; Discussed strategies to manage statin-induced myalgias; Reviewed importance of limiting foods high in cholesterol; Reviewed exercise goals and target of 150 minutes per week;  Hypertension: (Status: New goal. Goal on track:  YES.) Last practice recorded BP readings:  BP Readings from Last 3 Encounters:  09/08/21 124/82  06/22/21 133/84  03/22/21 112/72  Most recent eGFR/CrCl:  Lab Results  Component Value Date   EGFR 60 09/08/2021    No components found for: CRCL  Evaluation of current treatment plan related to hypertension self management and patient's adherence to plan as established by provider;   Provided education to patient re: stroke prevention, s/s of heart attack and stroke; Reviewed prescribed diet heart healthy diet  Reviewed medications with patient and discussed importance of compliance;  Discussed plans with patient for ongoing care management follow up and provided patient with direct contact information for care management team; Advised patient, providing education and rationale, to monitor blood pressure daily and record, calling PCP for findings outside established parameters;  Reviewed scheduled/upcoming provider appointments including:  Provided education on prescribed diet heart healthy;  Discussed complications of poorly controlled blood pressure such as heart disease, stroke, circulatory complications, vision complications, kidney impairment, sexual dysfunction;   Pain and fibromyalgia:  (Status: New goal. Goal on track: YES.) Pain assessment performed Medications reviewed Reviewed provider established plan for pain management; Discussed importance of adherence to all scheduled medical appointments; Counseled on the importance of reporting any/all new or changed pain symptoms or management strategies to pain management provider; Advised patient to report to care team affect of pain on daily activities; Discussed use of relaxation techniques  and/or diversional activities to assist with pain reduction (distraction, imagery, relaxation, massage, acupressure, TENS, heat, and cold application; Reviewed with patient prescribed pharmacological and nonpharmacological pain relief  strategies; Advised patient to discuss changes in level or intensity of pain, or unresolved pain with provider;  Patient Goals/Self-Care Activities: Patient will self administer medications as prescribed as evidenced by self report/primary caregiver report  Patient will attend all scheduled provider appointments as evidenced by clinician review of documented attendance to scheduled appointments and patient/caregiver report Patient will call pharmacy for medication refills as evidenced by patient report and review of pharmacy fill history as appropriate Patient will attend church or other social activities as evidenced by patient report Patient will continue to perform ADL's independently as evidenced by patient/caregiver report Patient will continue to perform IADL's independently as evidenced by patient/caregiver report Patient will call provider office for new concerns or questions as evidenced by review of documented incoming telephone call notes and patient report Patient will work with BSW to address care coordination needs and will continue to work with the clinical team to address health care and disease management related needs as evidenced by documented adherence to scheduled care management/care coordination appointments - check blood pressure weekly - choose a place to take my blood pressure (home, clinic or office, retail store) - write blood pressure results in a log or diary - learn about high blood pressure - keep a blood pressure log - take blood pressure log to all doctor appointments - call doctor for signs and symptoms of high blood pressure - develop an action plan for high blood pressure - keep all doctor appointments - take medications for blood pressure exactly as prescribed - report new symptoms to your doctor - eat more whole grains, fruits and vegetables, lean meats and healthy fats - call for medicine refill 2 or 3 days before it runs out - take all medications  exactly as prescribed - call doctor with any symptoms you believe are related to your medicine - call doctor when you experience any new symptoms - go to all doctor appointments as scheduled - adhere to prescribed diet: heart healthy       Plan:Telephone follow up appointment with care management team member scheduled for:  10-12-2021 at [place] am  Noreene Larsson RN, MSN, [facility] Mobile: [phone]

## 2021-09-19 DIAGNOSIS — F320 Major depressive disorder, single episode, mild: Secondary | ICD-10-CM | POA: Diagnosis not present

## 2021-09-19 DIAGNOSIS — I10 Essential (primary) hypertension: Secondary | ICD-10-CM | POA: Diagnosis not present

## 2021-09-19 DIAGNOSIS — M159 Polyosteoarthritis, unspecified: Secondary | ICD-10-CM

## 2021-09-19 DIAGNOSIS — E785 Hyperlipidemia, unspecified: Secondary | ICD-10-CM | POA: Diagnosis not present

## 2021-10-04 ENCOUNTER — Telehealth: Payer: PPO

## 2021-10-12 ENCOUNTER — Telehealth: Payer: Self-pay

## 2021-10-12 ENCOUNTER — Telehealth: Payer: PPO

## 2021-10-12 NOTE — Telephone Encounter (Signed)
  Care Management   Follow Up Note   10/12/2021 Name: Angela Martinez MRN: [id number] DOB: November 08, 1951   Referred by: Charlynne Cousins, MD Reason for referral : Chronic Care Management (RNCM: Follow up for Chronic Disease Management and Care Coordination Needs )   An unsuccessful telephone outreach was attempted today. The patient was referred to the case management team for assistance with care management and care coordination.   Follow Up Plan: A HIPPA compliant phone message was left for the patient providing contact information and requesting a return call.   Noreene Larsson RN, MSN, [facility] Mobile: [phone]

## 2021-10-20 ENCOUNTER — Ambulatory Visit: Payer: PPO | Admitting: Internal Medicine

## 2021-10-24 ENCOUNTER — Ambulatory Visit (INDEPENDENT_AMBULATORY_CARE_PROVIDER_SITE_OTHER): Payer: PPO | Admitting: *Deleted

## 2021-10-24 DIAGNOSIS — Z0000 Encounter for general adult medical examination without abnormal findings: Secondary | ICD-10-CM

## 2021-10-24 NOTE — Progress Notes (Addendum)
Subjective:   Angela Martinez is a 70 y.o. female who presents for Medicare Annual (Subsequent) preventive examination  I connected with  Lonnie J Losey on 10/24/21 by a telephone enabled telemedicine application and verified that I am speaking with the correct person using two identifiers.   I discussed the limitations of evaluation and management by telemedicine. The patient expressed understanding and agreed to proceed.  Patient location: home  Provider location: Tele-Health not in clinic    Review of Systems     Cardiac Risk Factors include: advanced age (>46men, >57 women);hypertension     Objective:    Today's Vitals   10/24/21 0948  PainSc: 0-No pain   There is no height or weight on file to calculate BMI.  Advanced Directives 10/24/2021 07/26/2018 07/05/2017 05/18/2016 03/16/2016  Does Patient Have a Medical Advance Directive? No No No No No  Would patient like information on creating a medical advance directive? No - Patient declined Yes (MAU/Ambulatory/Procedural Areas - Information given) Yes (MAU/Ambulatory/Procedural Areas - Information given) No - patient declined information Yes - Educational materials given    Current Medications (verified) Outpatient Encounter Medications as of 10/24/2021  Medication Sig   BIOTIN PO Take by mouth daily.   ezetimibe (ZETIA) 10 MG tablet Take 1 tablet (10 mg total) by mouth daily.   FLUoxetine (PROZAC) 20 MG capsule Take 1 tablet (20 mg total) by mouth daily.   fluticasone (FLONASE) 50 MCG/ACT nasal spray Place 2 sprays into both nostrils daily.   gabapentin (NEURONTIN) 300 MG capsule Take 1 capsule (300 mg total) by mouth 2 (two) times daily.   loratadine (CLARITIN) 10 MG tablet Take 10 mg by mouth daily.   MODERNA COVID-19 VACCINE 100 MCG/0.5ML injection    NAPROXEN PO Take by mouth as needed.   omeprazole (PRILOSEC) 20 MG capsule Take 1 capsule (20 mg total) by mouth daily.   valACYclovir (VALTREX) 500 MG tablet Take 1 tablet (500  mg total) by mouth as needed.   Vitamin D, Cholecalciferol, 400 UNITS TABS Take 1,000 Units by mouth.    No facility-administered encounter medications on file as of 10/24/2021.    Allergies (verified) Atorvastatin, Crestor [rosuvastatin calcium], Elemental sulfur, Lyrica [pregabalin], and Amoxicillin-pot clavulanate   History: Past Medical History:  Diagnosis Date   Anxiety    Complication of anesthesia    WITH WRIST SURGERY HARD TO GET RELAXED. OK WITH KNEE / BACK   Cough    Depression    Fibromyalgia    GERD (gastroesophageal reflux disease)    Herpes simplex    1 AND 2   Hyperlipidemia    OA (osteoarthritis)    Osteopenia    Overweight    Plantar fasciitis    Shortness of breath dyspnea    DOE   Tremors of nervous system    Past Surgical History:  Procedure Laterality Date   BACK SURGERY     BUNIONECTOMY     CATARACT EXTRACTION W/PHACO Right 03/16/2016   Procedure: CATARACT EXTRACTION PHACO AND INTRAOCULAR LENS PLACEMENT ([place]);  Surgeon: Leandrew Koyanagi, MD;  Location: [facility] ORS;  Service: Ophthalmology;  Laterality: Right;  [place] 52.4 AP% 8.8 CDE 4.63 Fluid Pack Lot # [id number]   CATARACT EXTRACTION W/PHACO Left 05/18/2016   Procedure: CATARACT EXTRACTION PHACO AND INTRAOCULAR LENS PLACEMENT ([facility]);  Surgeon: Leandrew Koyanagi, MD;  Location: [facility] ORS;  Service: Ophthalmology;  Laterality: Left;  [place] 47.3 AP% 10.4 CDE 4.90 Fluid pack lot # [id number]   DILATION AND CURETTAGE OF  UTERUS     x 3   JOINT REPLACEMENT     LUMBAR FUSION     L4-L5 with cage   TOTAL KNEE ARTHROPLASTY Left 2007   WRIST SURGERY     Family History  Problem Relation Age of Onset   Alcohol abuse Father    Alcohol abuse Sister    Arthritis Sister        5 sisters, some with RA   Seizures Sister    Cancer Sister        breast   Cirrhosis Sister    Diabetes Brother    Breast cancer Neg Hx    Social History   Socioeconomic History   Marital status: Married    Spouse name: Not on file    Number of children: Not on file   Years of education: Not on file   Highest education level: High school graduate  Occupational History   Not on file  Tobacco Use   Smoking status: Never   Smokeless tobacco: Never  Vaping Use   Vaping Use: Never used  Substance and Sexual Activity   Alcohol use: No   Drug use: No   Sexual activity: Not on file  Other Topics Concern   Not on file  Social History Narrative   Not on file   Social Determinants of Health   Financial Resource Strain: Low Risk    Difficulty of Paying Living Expenses: Not hard at all  Food Insecurity: No Food Insecurity   Worried About Charity fundraiser in the Last Year: Never true   [place] in the Last Year: Never true  Transportation Needs: No Transportation Needs   Lack of Transportation (Medical): No   Lack of Transportation (Non-Medical): No  Physical Activity: Insufficiently Active   Days of Exercise per Week: 3 days   Minutes of Exercise per Session: 40 min  Stress: No Stress Concern Present   Feeling of Stress : Not at all  Social Connections: Moderately Integrated   Frequency of Communication with Friends and Family: Twice a week   Frequency of Social Gatherings with Friends and Family: Twice a week   Attends Religious Services: More than 4 times per year   Active Member of [employer] or Organizations: No   Attends Music therapist: Never   Marital Status: Married    Tobacco Counseling Counseling given: Not Answered   Clinical Intake:  Pre-visit preparation completed: Yes  Pain : No/denies pain Pain Score: 0-No pain     Nutritional Risks: None Diabetes: No  How often do you need to have someone help you when you read instructions, pamphlets, or other written materials from your doctor or pharmacy?: 1 - Never  Diabetic?  no  Interpreter Needed?: No  Information entered by :: Leroy Kennedy LPN   Activities of Daily Living In your present state of health, do you  have any difficulty performing the following activities: 10/24/2021  Hearing? N  Vision? N  Difficulty concentrating or making decisions? N  Walking or climbing stairs? N  Dressing or bathing? N  Preparing Food and eating ? N  Using the Toilet? N  In the past six months, have you accidently leaked urine? N  Do you have problems with loss of bowel control? N  Managing your Medications? N  Managing your Finances? N  Housekeeping or managing your Housekeeping? N  Some recent data might be hidden    Patient Care Team: Charlynne Cousins, MD as PCP -  General (Internal Medicine) Beverly Gust, MD (Unknown Physician Specialty) Vanita Ingles, RN as Case Manager  Indicate any recent Medical Services you may have received from other than [facility] providers in the past year (date may be approximate).     Assessment:   This is a routine wellness examination for [place].  Hearing/Vision screen Hearing Screening - Comments:: No trouble hearing Vision Screening - Comments:: Up to date Has had cataract surgery [facility]  Dietary issues and exercise activities discussed: Current Exercise Habits: Home exercise routine, Type of exercise: stretching, Time (Minutes): 15, Frequency (Times/Week): 3, Weekly Exercise (Minutes/Week): 45, Intensity: Mild   Goals Addressed             This Visit's Progress    COMPLETED: DIET - INCREASE WATER INTAKE       Recommend continue drinking at least 6-8 glasses of water a day     Patient Stated       No goals at this time       Depression Screen PHQ 2/9 Scores 10/24/2021 09/08/2021 06/22/2021 12/22/2020 08/19/2020 05/10/2020 10/15/2019  PHQ - 2 Score 2 4 0 0 0 2 1  PHQ- 9 Score 4 11 0 0 2 4 6     Fall Risk Fall Risk  10/24/2021 09/08/2021 06/22/2021 12/22/2020 08/19/2020  Falls in the past year? 0 0 0 0 0  Comment - - - - -  Number falls in past yr: 0 0 0 - 0  Injury with Fall? 0 0 0 - 0  Risk for fall due to : - No Fall Risks No Fall Risks - No Fall Risks   Follow up Falls evaluation completed;Falls prevention discussed Falls evaluation completed Falls evaluation completed - Falls evaluation completed    FALL RISK PREVENTION PERTAINING TO THE HOME:  Any stairs in or around the home? No  If so, are there any without handrails? No  Home free of loose throw rugs in walkways, pet beds, electrical cords, etc? Yes  Adequate lighting in your home to reduce risk of falls? Yes   ASSISTIVE DEVICES UTILIZED TO PREVENT FALLS:  Life alert? No  Use of a cane, walker or w/c? No  Grab bars in the bathroom? No  Shower chair or bench in shower? No  Elevated toilet seat or a handicapped toilet? No   TIMED UP AND GO:  Was the test performed? No .    Cognitive Function:  Normal cognitive status assessed by direct observation by this Nurse Health Advisor. No abnormalities found.       6CIT Screen 10/24/2021 07/26/2018 07/05/2017  What Year? 0 points 0 points 0 points  What month? 0 points 0 points 0 points  What time? 0 points 0 points 0 points  Count back from 20 0 points 0 points 0 points  Months in reverse 0 points 0 points 0 points  Repeat phrase - 2 points 2 points  Total Score - 2 2    Immunizations Immunization History  Administered Date(s) Administered   Influenza,inj,Quad PF,6+ Mos 08/27/2017   Influenza,inj,quad, With Preservative 09/18/2018, 08/20/2019   Influenza-Unspecified 08/20/2014, 09/08/2015, 09/21/2016, 09/14/2018, 09/24/2020   Moderna Sars-Covid-2 Vaccination 01/16/2020, 02/13/2020, 10/18/2020   Pneumococcal Conjugate-13 12/27/2020   Tdap 07/16/2012    TDAP status: Up to date  Flu Vaccine status: Due, Education has been provided regarding the importance of this vaccine. Advised may receive this vaccine at local pharmacy or Health Dept. Aware to provide a copy of the vaccination record if obtained from local pharmacy or  Health Dept. Verbalized acceptance and understanding.  Pneumococcal vaccine status: Up to  date  Covid-19 vaccine status: Information provided on how to obtain vaccines.   Qualifies for Shingles Vaccine? Yes   Zostavax completed No   Shingrix Completed?: No.    Education has been provided regarding the importance of this vaccine. Patient has been advised to call insurance company to determine out of pocket expense if they have not yet received this vaccine. Advised may also receive vaccine at local pharmacy or Health Dept. Verbalized acceptance and understanding.  Screening Tests Health Maintenance  Topic Date Due   Zoster Vaccines- Shingrix (1 of 2) Never done   COVID-19 Vaccine (4 - Booster for Moderna series) 12/13/2020   COLONOSCOPY (Pts 45-37yrs Insurance coverage will need to be confirmed)  04/27/2021   INFLUENZA VACCINE  06/20/2021   Pneumonia Vaccine 60+ Years old (2 - PPSV23 if available, else PCV20) 12/27/2021   TETANUS/TDAP  07/16/2022   MAMMOGRAM  07/27/2023   DEXA SCAN  Completed   Hepatitis C Screening  Completed   HPV VACCINES  Aged Out    Health Maintenance  Health Maintenance Due  Topic Date Due   Zoster Vaccines- Shingrix (1 of 2) Never done   COVID-19 Vaccine (4 - Booster for Moderna series) 12/13/2020   COLONOSCOPY (Pts 45-66yrs Insurance coverage will need to be confirmed)  04/27/2021   INFLUENZA VACCINE  06/20/2021   Pneumonia Vaccine 52+ Years old (2 - PPSV23 if available, else PCV20) 12/27/2021    Colorectal cancer screening: Type of screening: Cologuard. Completed 2022. Repeat every 3 years  Mammogram status: Completed  . Repeat every year  Bone Density status: Completed 2021. Results reflect: Bone density results: OSTEOPOROSIS. Repeat every   years.  Lung Cancer Screening: (Low Dose CT Chest recommended if Age 22-80 years, 30 pack-year currently smoking OR have quit w/in 15years.) does not qualify.   Lung Cancer Screening Referral:   Additional Screening:  Hepatitis C Screening: does not qualify; Completed 2017  Vision Screening:  Recommended annual ophthalmology exams for early detection of glaucoma and other disorders of the eye. Is the patient up to date with their annual eye exam?  Yes  Who is the provider or what is the name of the office in which the patient attends annual eye exams? [facility] If pt is not established with a provider, would they like to be referred to a provider to establish care? No .   Dental Screening: Recommended annual dental exams for proper oral hygiene  Community Resource Referral / Chronic Care Management: CRR required this visit?  No   CCM required this visit?  No      Plan:     I have personally reviewed and noted the following in the patient's chart:   Medical and social history Use of alcohol, tobacco or illicit drugs  Current medications and supplements including opioid prescriptions.  Functional ability and status Nutritional status Physical activity Advanced directives List of other physicians Hospitalizations, surgeries, and ER visits in previous 12 months Vitals Screenings to include cognitive, depression, and falls Referrals and appointments  In addition, I have reviewed and discussed with patient certain preventive protocols, quality metrics, and best practice recommendations. A written personalized care plan for preventive services as well as general preventive health recommendations were provided to patient.     Leroy Kennedy, LPN   [id number]   Nurse Notes:

## 2021-10-24 NOTE — Patient Instructions (Signed)
Angela Martinez , Thank you for taking time to come for your Medicare Wellness Visit. I appreciate your ongoing commitment to your health goals. Please review the following plan we discussed and let me know if I can assist you in the future.   Screening recommendations/referrals: Colonoscopy: up to date Mammogram: up tod ate Bone Density: up to date Recommended yearly ophthalmology/optometry visit for glaucoma screening and checkup Recommended yearly dental visit for hygiene and checkup  Vaccinations: Influenza vaccine: Education provided Pneumococcal vaccine: up to date Tdap vaccine: up to date Shingles vaccine:Education provided  Advanced directives: Education provided  Conditions/risks identified:   Next appointment: 10-25-2021 [name]             Preventive Care 72 Years and Older, Female Preventive care refers to lifestyle choices and visits with your health care provider that can promote health and wellness. What does preventive care include? A yearly physical exam. This is also called an annual well check. Dental exams once or twice a year. Routine eye exams. Ask your health care provider how often you should have your eyes checked. Personal lifestyle choices, including: Daily care of your teeth and gums. Regular physical activity. Eating a healthy diet. Avoiding tobacco and drug use. Limiting alcohol use. Practicing safe sex. Taking low-dose aspirin every day. Taking vitamin and mineral supplements as recommended by your health care provider. What happens during an annual well check? The services and screenings done by your health care provider during your annual well check will depend on your age, overall health, lifestyle risk factors, and family history of disease. Counseling  Your health care provider may ask you questions about your: Alcohol use. Tobacco use. Drug use. Emotional well-being. Home and relationship well-being. Sexual activity. Eating habits. History of  falls. Memory and ability to understand (cognition). Work and work Statistician. Reproductive health. Screening  You may have the following tests or measurements: Height, weight, and BMI. Blood pressure. Lipid and cholesterol levels. These may be checked every 5 years, or more frequently if you are over 12 years old. Skin check. Lung cancer screening. You may have this screening every year starting at age 54 if you have a 30-pack-year history of smoking and currently smoke or have quit within the past 15 years. Fecal occult blood test (FOBT) of the stool. You may have this test every year starting at age 75. Flexible sigmoidoscopy or colonoscopy. You may have a sigmoidoscopy every 5 years or a colonoscopy every 10 years starting at age 77. Hepatitis C blood test. Hepatitis B blood test. Sexually transmitted disease (STD) testing. Diabetes screening. This is done by checking your blood sugar (glucose) after you have not eaten for a while (fasting). You may have this done every 1-3 years. Bone density scan. This is done to screen for osteoporosis. You may have this done starting at age 32. Mammogram. This may be done every 1-2 years. Talk to your health care provider about how often you should have regular mammograms. Talk with your health care provider about your test results, treatment options, and if necessary, the need for more tests. Vaccines  Your health care provider may recommend certain vaccines, such as: Influenza vaccine. This is recommended every year. Tetanus, diphtheria, and acellular pertussis (Tdap, Td) vaccine. You may need a Td booster every 10 years. Zoster vaccine. You may need this after age 13. Pneumococcal 13-valent conjugate (PCV13) vaccine. One dose is recommended after age 46. Pneumococcal polysaccharide (PPSV23) vaccine. One dose is recommended after age 43. Talk to  your health care provider about which screenings and vaccines you need and how often you need  them. This information is not intended to replace advice given to you by your health care provider. Make sure you discuss any questions you have with your health care provider. Document Released: 12/03/2015 Document Revised: 07/26/2016 Document Reviewed: 09/07/2015 Elsevier Interactive Patient Education  2017 [place] Prevention in the Home Falls can cause injuries. They can happen to people of all ages. There are many things you can do to make your home safe and to help prevent falls. What can I do on the outside of my home? Regularly fix the edges of walkways and driveways and fix any cracks. Remove anything that might make you trip as you walk through a door, such as a raised step or threshold. Trim any bushes or trees on the path to your home. Use bright outdoor lighting. Clear any walking paths of anything that might make someone trip, such as rocks or tools. Regularly check to see if handrails are loose or broken. Make sure that both sides of any steps have handrails. Any raised decks and porches should have guardrails on the edges. Have any leaves, snow, or ice cleared regularly. Use sand or salt on walking paths during winter. Clean up any spills in your garage right away. This includes oil or grease spills. What can I do in the bathroom? Use night lights. Install grab bars by the toilet and in the tub and shower. Do not use towel bars as grab bars. Use non-skid mats or decals in the tub or shower. If you need to sit down in the shower, use a plastic, non-slip stool. Keep the floor dry. Clean up any water that spills on the floor as soon as it happens. Remove soap buildup in the tub or shower regularly. Attach bath mats securely with double-sided non-slip rug tape. Do not have throw rugs and other things on the floor that can make you trip. What can I do in the bedroom? Use night lights. Make sure that you have a light by your bed that is easy to reach. Do not use  any sheets or blankets that are too big for your bed. They should not hang down onto the floor. Have a firm chair that has side arms. You can use this for support while you get dressed. Do not have throw rugs and other things on the floor that can make you trip. What can I do in the kitchen? Clean up any spills right away. Avoid walking on wet floors. Keep items that you use a lot in easy-to-reach places. If you need to reach something above you, use a strong step stool that has a grab bar. Keep electrical cords out of the way. Do not use floor polish or wax that makes floors slippery. If you must use wax, use non-skid floor wax. Do not have throw rugs and other things on the floor that can make you trip. What can I do with my stairs? Do not leave any items on the stairs. Make sure that there are handrails on both sides of the stairs and use them. Fix handrails that are broken or loose. Make sure that handrails are as long as the stairways. Check any carpeting to make sure that it is firmly attached to the stairs. Fix any carpet that is loose or worn. Avoid having throw rugs at the top or bottom of the stairs. If you do have throw rugs, attach  them to the floor with carpet tape. Make sure that you have a light switch at the top of the stairs and the bottom of the stairs. If you do not have them, ask someone to add them for you. What else can I do to help prevent falls? Wear shoes that: Do not have high heels. Have rubber bottoms. Are comfortable and fit you well. Are closed at the toe. Do not wear sandals. If you use a stepladder: Make sure that it is fully opened. Do not climb a closed stepladder. Make sure that both sides of the stepladder are locked into place. Ask someone to hold it for you, if possible. Clearly mark and make sure that you can see: Any grab bars or handrails. First and last steps. Where the edge of each step is. Use tools that help you move around (mobility aids)  if they are needed. These include: Canes. Walkers. Scooters. Crutches. Turn on the lights when you go into a dark area. Replace any light bulbs as soon as they burn out. Set up your furniture so you have a clear path. Avoid moving your furniture around. If any of your floors are uneven, fix them. If there are any pets around you, be aware of where they are. Review your medicines with your doctor. Some medicines can make you feel dizzy. This can increase your chance of falling. Ask your doctor what other things that you can do to help prevent falls. This information is not intended to replace advice given to you by your health care provider. Make sure you discuss any questions you have with your health care provider. Document Released: 09/02/2009 Document Revised: 04/13/2016 Document Reviewed: 12/11/2014 Elsevier Interactive Patient Education  2017 [employer].

## 2021-10-25 ENCOUNTER — Ambulatory Visit: Payer: PPO | Admitting: Internal Medicine

## 2021-11-01 ENCOUNTER — Ambulatory Visit (INDEPENDENT_AMBULATORY_CARE_PROVIDER_SITE_OTHER): Payer: PPO | Admitting: Internal Medicine

## 2021-11-01 ENCOUNTER — Encounter: Payer: Self-pay | Admitting: Internal Medicine

## 2021-11-01 ENCOUNTER — Other Ambulatory Visit: Payer: Self-pay

## 2021-11-01 VITALS — BP 122/79 | HR 66 | Temp 98.8°F | Ht 64.96 in | Wt 169.2 lb

## 2021-11-01 DIAGNOSIS — E785 Hyperlipidemia, unspecified: Secondary | ICD-10-CM | POA: Diagnosis not present

## 2021-11-01 DIAGNOSIS — E039 Hypothyroidism, unspecified: Secondary | ICD-10-CM | POA: Diagnosis not present

## 2021-11-01 DIAGNOSIS — M810 Age-related osteoporosis without current pathological fracture: Secondary | ICD-10-CM

## 2021-11-01 MED ORDER — ALENDRONATE SODIUM 70 MG PO TABS: 70.0000 mg | ORAL_TABLET | ORAL | 11 refills | Status: DC
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
## 2021-11-01 NOTE — Patient Instructions (Signed)
OSTEOPOROSIS patient advised to take this once a month on empty stomach needs to sit upright for an hour after take this drug by mouth with a full glass of water.  Take it as directed on the prescription label on the same day of each month.Take the dose right after waking up. Do not eat or drink anything before taking it. Do not take it with any other drink except water. Do not chew or crush the tablet. After taking it, do not eat breakfast, drink, or take any other drugs or vitamins for at least 30 minutes. Sit or stand up for at least 60 minutes after you take it. Do notlie down. Keep taking it unless your heOsteoporosis Osteoporosis is when the bones get thin and weak. This can cause your bones to break (fracture) more easily. What are the causes? The exact cause of this condition is not known. What increases the risk? Having family members with this condition. Not eating enough healthy foods. Taking certain medicines. Being female. Being age 14 or older. Smoking or using other products that contain nicotine or tobacco, such as e-cigarettes or chewing tobacco. Not exercising. Being of European or Asian ancestry. Having a small body frame. What are the signs or symptoms? A broken bone might be the first sign, especially if the break results from a fall or injury that usually would not cause a bone to break. Other signs and symptoms include: Pain in the neck or low back. Being hunched over (stooped posture). Getting shorter. How is this treated? Eating more foods with more calcium and vitamin D in them. Doing exercises. Stopping tobacco use. Limiting how much alcohol you drink. Taking medicines to slow bone loss or help make the bones stronger. Taking supplements of calcium and vitamin D every day. Taking medicines to replace chemicals in the body (hormone replacement medicines). Monitoring your levels of calcium and vitamin D. The goal of treatment is to strengthen your bones and  lower your risk for a bone break. Follow these instructions at home: Eating and drinking Eat plenty of calcium and vitamin D. These nutrients are good for your bones. Good sources of calcium and vitamin D include: Some fish, such as salmon and tuna. Foods that have calcium and vitamin D added to them (fortified foods), such as some breakfast cereals. Egg yolks. Cheese. Liver.  Activity Do exercises as told by your doctor. Ask your doctor what exercises are safe for you. You should do: Exercises that make your muscles work to hold your body weight up (weight-bearing exercises). These include tai chi, yoga, and walking. Exercises to make your muscles stronger. One example is lifting weights. Lifestyle Do not drink alcohol if: Your doctor tells you not to drink. You are pregnant, may be pregnant, or are planning to become pregnant. If you drink alcohol: Limit how much you use to: 0-1 drink a day for women. 0-2 drinks a day for men. Know how much alcohol is in your drink. In the [place] one drink equals one 12 oz bottle of beer (355 mL), one 5 oz glass of wine (148 mL), or one 1 oz glass of hard liquor (44 mL). Do not smoke or use any products that contain nicotine or tobacco. If you need help quitting, ask your doctor. Preventing falls Use tools to help you move around (mobility aids) as needed. These include canes, walkers, scooters, and crutches. Keep rooms well-lit. Put away things on the floor that could make you trip. These include cords and rugs.  Install safety rails on stairs. Install grab bars in bathrooms. Use rubber mats in slippery areas, like bathrooms. Wear shoes that: Fit you well. Support your feet. Have closed toes. Have rubber soles or low heels. Tell your doctor about all of the medicines you are taking. Some medicines can make you more likely to fall. General instructions Take over-the-counter and prescription medicines only as told by your doctor. Keep all  follow-up visits. Contact a doctor if: You have not been tested (screened) for osteoporosis and you are: A woman who is age 25 or older. A man who is age 52 or older. Get help right away if: You fall. You get hurt. Summary Osteoporosis happens when your bones get thin and weak. Weak bones can break (fracture) more easily. Eat plenty of calcium and vitamin D. These are good for your bones. Tell your doctor about all of the medicines that you take. This information is not intended to replace advice given to you by your health care provider. Make sure you discuss any questions you have with your health care provider. Document Revised: 04/22/2020 Document Reviewed: 04/22/2020 Elsevier Patient Education  2022 [place] care provider tells you to stop.

## 2021-11-02 LAB — COMPREHENSIVE METABOLIC PANEL
ALT: 8 IU/L (ref 0–32)
AST: 14 IU/L (ref 0–40)
Albumin/Globulin Ratio: 1.5 (ref 1.2–2.2)
Albumin: 4.1 g/dL (ref 3.7–4.7)
Alkaline Phosphatase: 65 IU/L (ref 44–121)
BUN/Creatinine Ratio: 12 (ref 12–28)
BUN: 12 mg/dL (ref 8–27)
Bilirubin Total: 0.4 mg/dL (ref 0.0–1.2)
CO2: 25 mmol/L (ref 20–29)
Calcium: 9.4 mg/dL (ref 8.7–10.3)
Chloride: 104 mmol/L (ref 96–106)
Creatinine, Ser: 0.99 mg/dL (ref 0.57–1.00)
Globulin, Total: 2.7 g/dL (ref 1.5–4.5)
Glucose: 82 mg/dL (ref 70–99)
Potassium: 4.6 mmol/L (ref 3.5–5.2)
Sodium: 142 mmol/L (ref 134–144)
Total Protein: 6.8 g/dL (ref 6.0–8.5)
eGFR: 61 mL/min/{1.73_m2} (ref >59)

## 2021-11-02 LAB — TSH: TSH: 4.67 u[IU]/mL — ABNORMAL HIGH (ref 0.450–4.500)

## 2021-11-02 LAB — T4, FREE: Free T4: 1.02 ng/dL (ref 0.82–1.77)

## 2021-12-07 ENCOUNTER — Other Ambulatory Visit: Payer: Self-pay

## 2021-12-07 ENCOUNTER — Other Ambulatory Visit: Payer: PPO

## 2021-12-07 DIAGNOSIS — E785 Hyperlipidemia, unspecified: Secondary | ICD-10-CM | POA: Diagnosis not present

## 2021-12-07 DIAGNOSIS — E039 Hypothyroidism, unspecified: Secondary | ICD-10-CM

## 2021-12-08 LAB — LIPID PANEL
Chol/HDL Ratio: 4.1 ratio (ref 0.0–4.4)
Cholesterol, Total: 207 mg/dL — ABNORMAL HIGH (ref 100–199)
HDL: 50 mg/dL (ref >39)
LDL Chol Calc (NIH): 135 mg/dL — ABNORMAL HIGH (ref 0–99)
Triglycerides: 125 mg/dL (ref 0–149)
VLDL Cholesterol Cal: 22 mg/dL (ref 5–40)

## 2021-12-13 ENCOUNTER — Ambulatory Visit: Payer: PPO | Admitting: Internal Medicine

## 2021-12-20 ENCOUNTER — Telehealth: Payer: PPO

## 2021-12-20 ENCOUNTER — Telehealth: Payer: Self-pay

## 2021-12-20 NOTE — Telephone Encounter (Signed)
°  Care Management   Follow Up Note   12/20/2021 Name: Angela Martinez MRN: [id number] DOB: 02-28-1951   Referred by: Charlynne Cousins, MD Reason for referral : Chronic Care Management (RNCM: Follow up for Chronic Disease Management and Care Coordination Needs- 2nd attempt)   A second unsuccessful telephone outreach was attempted today. The patient was referred to the case management team for assistance with care management and care coordination.   Follow Up Plan: A HIPPA compliant phone message was left for the patient providing contact information and requesting a return call.   Noreene Larsson RN, MSN, [facility] Mobile: [phone]

## 2021-12-26 ENCOUNTER — Ambulatory Visit: Payer: Self-pay

## 2021-12-26 ENCOUNTER — Encounter: Payer: Self-pay | Admitting: Internal Medicine

## 2021-12-26 ENCOUNTER — Ambulatory Visit (INDEPENDENT_AMBULATORY_CARE_PROVIDER_SITE_OTHER): Payer: PPO | Admitting: Internal Medicine

## 2021-12-26 ENCOUNTER — Other Ambulatory Visit: Payer: Self-pay

## 2021-12-26 VITALS — BP 124/82 | HR 80 | Temp 98.6°F | Ht 64.96 in | Wt 173.4 lb

## 2021-12-26 DIAGNOSIS — G500 Trigeminal neuralgia: Secondary | ICD-10-CM | POA: Diagnosis not present

## 2021-12-26 DIAGNOSIS — R350 Frequency of micturition: Secondary | ICD-10-CM | POA: Diagnosis not present

## 2021-12-26 DIAGNOSIS — M797 Fibromyalgia: Secondary | ICD-10-CM | POA: Diagnosis not present

## 2021-12-26 DIAGNOSIS — M549 Dorsalgia, unspecified: Secondary | ICD-10-CM | POA: Insufficient documentation

## 2021-12-26 LAB — MICROSCOPIC EXAMINATION

## 2021-12-26 LAB — URINALYSIS, ROUTINE W REFLEX MICROSCOPIC
Bilirubin, UA: NEGATIVE
Glucose, UA: NEGATIVE
Ketones, UA: NEGATIVE
Nitrite, UA: POSITIVE — AB
Protein,UA: NEGATIVE
Specific Gravity, UA: 1.015 (ref 1.005–1.030)
Urobilinogen, Ur: 0.2 mg/dL (ref 0.2–1.0)
pH, UA: 5.5 (ref 5.0–7.5)

## 2021-12-26 MED ORDER — GABAPENTIN 300 MG PO CAPS: 600.0000 mg | ORAL_CAPSULE | Freq: Every day | ORAL | 2 refills | Status: DC

## 2021-12-26 MED ORDER — EZETIMIBE 10 MG PO TABS: 10.0000 mg | ORAL_TABLET | Freq: Every day | ORAL | 6 refills | Status: DC

## 2021-12-26 MED ORDER — CEPHALEXIN 500 MG PO CAPS: 500.0000 mg | ORAL_CAPSULE | Freq: Two times a day (BID) | ORAL | 0 refills | Status: AC

## 2021-12-26 MED ORDER — ALENDRONATE SODIUM 70 MG PO TABS: 70.0000 mg | ORAL_TABLET | ORAL | 11 refills | Status: DC

## 2021-12-26 NOTE — Telephone Encounter (Signed)
°  Chief Complaint: trigeminal neuralgia Symptoms: face pain near gum and mouth Frequency: today Pertinent Negatives: NA Disposition: [] ED /[] Urgent Care (no appt availability in office) / [x] Appointment(In office/virtual)/ []  [place] [facility]/ [] Home Care/ [] Refused Recommended Disposition /[] [facility]/ []  Follow-up with PCP Additional Notes:    Reason for Disposition  [1] SEVERE pain (e.g., excruciating) AND [2] not improved after 2 hours of pain medicine  Answer Assessment - Initial Assessment Questions 1. ONSET: "When did the pain start?" (e.g., minutes, hours, days)     Yesterday and today 2. ONSET: "Does the pain come and go, or has it been constant since it started?" (e.g., constant, intermittent, fleeting)     Comes and goes  3. SEVERITY: "How bad is the pain?"   (Scale 1-10; mild, moderate or severe)   - MILD (1-3): doesn't interfere with normal activities    - MODERATE (4-7): interferes with normal activities or awakens from sleep    - SEVERE (8-10): excruciating pain, unable to do any normal activities      Severe when present 4. LOCATION: "Where does it hurt?"      Near mouth 5. RASH: "Is there any redness, rash, or swelling of the face?"     NO 6. FEVER: "Do you have a fever?" If Yes, ask: "What is it, how was it measured, and when did it start?"      NO 7. OTHER SYMPTOMS: "Do you have any other symptoms?" (e.g., fever, toothache, nasal discharge, nasal congestion, clicking sensation in jaw joint) No  Protocols used: Face Pain-A-AH

## 2021-12-26 NOTE — Patient Instructions (Signed)
Dr Gurney Maxin neurology   Address: [address] [place], [place], [place] [address] Phone: [phone]

## 2021-12-26 NOTE — Telephone Encounter (Signed)
Patient called, left VM to return the call to the office to discuss symptoms with a nurse.   Pt stated she has Neuralgia around her gum area/ pt asked for medication that she had in the past / was able to schedule pt an appt for this afternoon / Please advise if needed

## 2021-12-26 NOTE — Progress Notes (Signed)
BP 124/82    Pulse 80    Temp 98.6 F (37 C) (Oral)    Ht 5' 4.96" (1.65 m)    Wt 173 lb 6.4 oz (78.7 kg)    SpO2 98%    BMI 28.89 kg/m    Subjective:    Patient ID: Angela Martinez, female    DOB: Sep 07, 1951, 71 y.o.   MRN: [id number]  Chief Complaint  Patient presents with   Urinary Frequency    Started last Wednesday    Oral Pain    For the past 2 weeks, she has been feeling hot little needs in her mouth and upper gums.     HPI: [name] is a 71 y.o. female  Pt is on fosamax for osteoporosis hasnt been exercise   Urinary Frequency  This is a new (last wednesday had to go home and had an accident and had incontinence of stool never happened since then thinks she has a UTI from this.) problem. The current episode started yesterday. The pain is mild. Associated symptoms include chills, frequency, nausea and urgency. Pertinent negatives include no discharge, flank pain, hematuria, hesitancy, possible pregnancy, sweats or vomiting. There is no history of recurrent UTIs.  Oral Pain  Chronicity: has  ?neuralgia has sharp pains  says she cannot eat comes to her gums per pt. went to the physician @ neurology in the past x 2 yrs ago went away , recurrent now. was diagnosed with neurology.   Chief Complaint  Patient presents with   Urinary Frequency    Started last Wednesday    Oral Pain    For the past 2 weeks, she has been feeling hot little needs in her mouth and upper gums.     Relevant past medical, surgical, family and social history reviewed and updated as indicated. Interim medical history since our last visit reviewed. Allergies and medications reviewed and updated.  Review of Systems  Constitutional:  Positive for chills.  Gastrointestinal:  Positive for nausea. Negative for vomiting.  Genitourinary:  Positive for frequency and urgency. Negative for flank pain, hematuria and hesitancy.   Per HPI unless specifically indicated above     Objective:    BP 124/82     Pulse 80    Temp 98.6 F (37 C) (Oral)    Ht 5' 4.96" (1.65 m)    Wt 173 lb 6.4 oz (78.7 kg)    SpO2 98%    BMI 28.89 kg/m   Wt Readings from Last 3 Encounters:  12/26/21 173 lb 6.4 oz (78.7 kg)  11/01/21 169 lb 3.2 oz (76.7 kg)  09/08/21 175 lb 9.6 oz (79.7 kg)    Physical Exam Vitals and nursing note reviewed.  Constitutional:      General: She is not in acute distress.    Appearance: Normal appearance. She is not ill-appearing or diaphoretic.  Eyes:     Conjunctiva/sclera: Conjunctivae normal.  Pulmonary:     Breath sounds: No rhonchi.  Abdominal:     General: Abdomen is flat. Bowel sounds are normal. There is no distension.     Palpations: Abdomen is soft. There is no mass.     Tenderness: There is no abdominal tenderness. There is no guarding.  Skin:    General: Skin is warm and dry.     Coloration: Skin is not jaundiced.     Findings: No erythema.  Neurological:     Mental Status: She is alert.    Results for orders  placed or performed in visit on 12/07/21  Lipid panel  Result Value Ref Range   Cholesterol, Total 207 (H) 100 - 199 mg/dL   Triglycerides 125 0 - 149 mg/dL   HDL 50 >39 mg/dL   VLDL Cholesterol Cal 22 5 - 40 mg/dL   LDL Chol Calc (NIH) 135 (H) 0 - 99 mg/dL   Chol/HDL Ratio 4.1 0.0 - 4.4 ratio        Current Outpatient Medications:    BIOTIN PO, Take by mouth daily., Disp: , Rfl:    ezetimibe (ZETIA) 10 MG tablet, Take 1 tablet (10 mg total) by mouth daily., Disp: 30 tablet, Rfl: 3   FLUoxetine (PROZAC) 20 MG capsule, Take 1 tablet (20 mg total) by mouth daily., Disp: 90 capsule, Rfl: 4   fluticasone (FLONASE) 50 MCG/ACT nasal spray, Place 2 sprays into both nostrils daily., Disp: 16 g, Rfl: 6   gabapentin (NEURONTIN) 300 MG capsule, Take 1 capsule (300 mg total) by mouth 2 (two) times daily., Disp: 180 capsule, Rfl: 4   loratadine (CLARITIN) 10 MG tablet, Take 10 mg by mouth daily., Disp: , Rfl:    magnesium citrate SOLN, Take 1 Bottle by mouth  once., Disp: , Rfl:    NAPROXEN PO, Take by mouth as needed., Disp: , Rfl:    omeprazole (PRILOSEC) 20 MG capsule, Take 1 capsule (20 mg total) by mouth daily., Disp: 90 capsule, Rfl: 4   valACYclovir (VALTREX) 500 MG tablet, Take 1 tablet (500 mg total) by mouth as needed., Disp: 30 tablet, Rfl: 4   Vitamin D, Cholecalciferol, 400 UNITS TABS, Take 1,000 Units by mouth. , Disp: , Rfl:    alendronate (FOSAMAX) 70 MG tablet, Take 1 tablet (70 mg total) by mouth every 7 (seven) days. Take with a full glass of water on an empty stomach., Disp: 4 tablet, Rfl: 11   MODERNA COVID-19 VACCINE 100 MCG/0.5ML injection, , Disp: , Rfl:     Assessment & Plan:  UTI UA +ve for nitrities and leuks    pt is currently symptomatic for an Urinary tract infection(abd pain, burning etc), will cover with emperic abx, see med module for details.  encouraged to increase water/fluid intake.Signs and symptoms of emergency were discussed with the patient. The risks, benefits and side effects of treatment were discussed with the patient. The patient verbalized an understanding of plan, and was told to call the clinic/go to the ED if symptoms worsen at any point of time.  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    Problem List Items Addressed This Visit   None Visit Diagnoses     Frequency of urination    -  Primary   Relevant Orders   Urinalysis, Routine w reflex microscopic   Urine Culture   Back pain, unspecified back location, unspecified back pain laterality, unspecified chronicity       Relevant Orders   Urinalysis, Routine w reflex microscopic   Urine Culture        Orders Placed This Encounter  Procedures   Urine Culture   Urinalysis, Routine w reflex microscopic     Meds ordered this encounter  Medications   alendronate (FOSAMAX) 70 MG tablet     Sig: Take 1 tablet (70 mg total) by mouth every 7 (seven) days. Take with a full glass of water  on an empty stomach.    Dispense:  4 tablet    Refill:  11     Follow up plan: No follow-ups on file.

## 2021-12-28 LAB — URINE CULTURE

## 2022-01-06 ENCOUNTER — Other Ambulatory Visit: Payer: Self-pay | Admitting: Nurse Practitioner

## 2022-01-06 DIAGNOSIS — K219 Gastro-esophageal reflux disease without esophagitis: Secondary | ICD-10-CM

## 2022-01-06 NOTE — Telephone Encounter (Signed)
Requested Prescriptions  Pending Prescriptions Disp Refills   FLUoxetine (PROZAC) 20 MG capsule [Pharmacy Med Name: FLUOXETINE HCL 20 MG CAPSULE] 90 capsule 0    Sig: Take 1 tablet (20 mg total) by mouth daily.     Psychiatry:  Antidepressants - SSRI Passed - 01/06/2022  9:58 AM      Passed - Completed PHQ-2 or PHQ-9 in the last 360 days      Passed - Valid encounter within last 6 months    Recent Outpatient Visits          1 week ago Frequency of urination   [place], MD   2 months ago Hyperlipidemia, unspecified hyperlipidemia type   [facility] [name], [name], MD   4 months ago Hyperlipidemia, unspecified hyperlipidemia type   [place] [name], [name], MD   6 months ago Screen for colon cancer   [place] [name], [name], MD   9 months ago Upper respiratory tract infection, unspecified type   [place] McElwee, Lauren A, NP      Future Appointments            In 2 months [name], [name], MD [place]            omeprazole (PRILOSEC) 20 MG capsule [Pharmacy Med Name: OMEPRAZOLE DR 20 MG CAPSULE] 90 capsule 0    Sig: Take 1 capsule (20 mg total) by mouth daily.     Gastroenterology: Proton Pump Inhibitors Passed - 01/06/2022  9:58 AM      Passed - Valid encounter within last 12 months    Recent Outpatient Visits          1 week ago Frequency of urination   [place] [name], [name], MD   2 months ago Hyperlipidemia, unspecified hyperlipidemia type   [facility] [name], [name], MD   4 months ago Hyperlipidemia, unspecified hyperlipidemia type   [place] [name], [name], MD   6 months ago Screen for colon cancer   [place] [name], [name], MD   9 months ago Upper respiratory tract infection, unspecified type   [place] McElwee, Scheryl Darter, NP      Future Appointments            In 2 months [name], [name], MD  [facility]

## 2022-01-09 ENCOUNTER — Ambulatory Visit: Payer: PPO | Admitting: Internal Medicine

## 2022-01-10 ENCOUNTER — Telehealth: Payer: Self-pay | Admitting: Internal Medicine

## 2022-01-10 NOTE — Telephone Encounter (Signed)
Copied from [address] [id number]. Topic: Medicare AWV >> Jan 10, 2022  1:58 PM Lavonia Drafts wrote: Reason for CRM: Left message for patient to call back and schedule the Medicare Annual Wellness Visit (AWV) virtually or by telephone.  Last AWV 07/26/18  Please schedule at anytime with CFP-Nurse Health Advisor.  45 minute appointment  Any questions, please call me at [phone]

## 2022-01-12 NOTE — Progress Notes (Signed)
I, as supervising physician, have reviewed the nurse health advisor's Medicare Wellness Visit note for this patient and concur with the findings and recommendations listed above.   

## 2022-01-24 ENCOUNTER — Telehealth: Payer: Self-pay

## 2022-01-24 ENCOUNTER — Telehealth: Payer: PPO

## 2022-01-24 NOTE — Telephone Encounter (Signed)
?  Care Management  ? ?Follow Up Note ? ? ?01/24/2022 ?Name: Angela Martinez MRN: [id number] DOB: Jun 25, 1951 ? ? ?Referred by: Charlynne Cousins, MD ?Reason for referral : Chronic Care Management (RNCM: Follow up for Chronic Disease Management and Care Coordination Needs) ? ? ?A second unsuccessful telephone outreach was attempted today. The patient was referred to the case management team for assistance with care management and care coordination.  ? ?Follow Up Plan: A HIPPA compliant phone message was left for the patient providing contact information and requesting a return call.  ? ?Noreene Larsson RN, MSN, CCM ?Community Care Coordinator ?[facility] ?[place] ?Mobile: [phone]  ?

## 2022-01-26 DIAGNOSIS — G500 Trigeminal neuralgia: Secondary | ICD-10-CM | POA: Diagnosis not present

## 2022-01-26 DIAGNOSIS — M542 Cervicalgia: Secondary | ICD-10-CM | POA: Diagnosis not present

## 2022-01-26 DIAGNOSIS — R251 Tremor, unspecified: Secondary | ICD-10-CM | POA: Diagnosis not present

## 2022-01-26 DIAGNOSIS — R202 Paresthesia of skin: Secondary | ICD-10-CM | POA: Diagnosis not present

## 2022-01-26 DIAGNOSIS — F411 Generalized anxiety disorder: Secondary | ICD-10-CM | POA: Diagnosis not present

## 2022-01-26 DIAGNOSIS — R200 Anesthesia of skin: Secondary | ICD-10-CM | POA: Diagnosis not present

## 2022-02-09 DIAGNOSIS — R251 Tremor, unspecified: Secondary | ICD-10-CM | POA: Diagnosis not present

## 2022-02-09 DIAGNOSIS — G500 Trigeminal neuralgia: Secondary | ICD-10-CM | POA: Diagnosis not present

## 2022-02-09 DIAGNOSIS — R200 Anesthesia of skin: Secondary | ICD-10-CM | POA: Diagnosis not present

## 2022-02-09 DIAGNOSIS — F411 Generalized anxiety disorder: Secondary | ICD-10-CM | POA: Diagnosis not present

## 2022-02-09 DIAGNOSIS — M542 Cervicalgia: Secondary | ICD-10-CM | POA: Diagnosis not present

## 2022-02-09 DIAGNOSIS — R202 Paresthesia of skin: Secondary | ICD-10-CM | POA: Diagnosis not present

## 2022-02-24 ENCOUNTER — Telehealth: Payer: Self-pay

## 2022-02-24 NOTE — Chronic Care Management (AMB) (Signed)
?  Care Management  ? ?Note ? ?02/24/2022 ?Name: Angela Martinez MRN: [id number] DOB: 08-27-1951 ? ?Angela Martinez is a 71 y.o. year old female who is a primary care patient of [name], [name], MD and is actively engaged with the care management team. I reached out to [employer] by phone today to assist with re-scheduling a follow up visit with the RN Case Manager ? ?Follow up plan: ?Unsuccessful telephone outreach attempt made. A HIPAA compliant phone message was left for the patient providing contact information and requesting a return call.  ?The care management team will reach out to the patient again over the next 7 days.  ?If patient returns call to provider office, please advise to call [place]  at [phone] ? ?Noreene Larsson, RMA ?Care Guide, Embedded Care Coordination ?[facility]  Care Management  ?[place], [place] [address] ?Direct Dial: [phone] ?Museum/gallery conservator.[email or web address] ?Website: [email or web address]  ? ?

## 2022-03-06 DIAGNOSIS — G500 Trigeminal neuralgia: Secondary | ICD-10-CM | POA: Diagnosis not present

## 2022-03-09 ENCOUNTER — Encounter: Payer: Self-pay | Admitting: Internal Medicine

## 2022-03-14 ENCOUNTER — Ambulatory Visit (INDEPENDENT_AMBULATORY_CARE_PROVIDER_SITE_OTHER): Payer: PPO | Admitting: Internal Medicine

## 2022-03-14 ENCOUNTER — Encounter: Payer: Self-pay | Admitting: Internal Medicine

## 2022-03-14 VITALS — BP 114/77 | HR 75 | Temp 98.4°F | Ht 65.0 in | Wt 171.0 lb

## 2022-03-14 DIAGNOSIS — R350 Frequency of micturition: Secondary | ICD-10-CM

## 2022-03-14 DIAGNOSIS — J069 Acute upper respiratory infection, unspecified: Secondary | ICD-10-CM | POA: Diagnosis not present

## 2022-03-14 MED ORDER — SULFAMETHOXAZOLE-TRIMETHOPRIM 800-160 MG PO TABS: 1.0000 | ORAL_TABLET | Freq: Two times a day (BID) | ORAL | 0 refills | Status: AC

## 2022-03-14 NOTE — Progress Notes (Signed)
? ?BP 114/77   Pulse 75   Temp 98.4 ?F (36.9 ?C) (Oral)   Ht '5\' 5"'$  (1.651 m)   Wt 171 lb (77.6 kg)   SpO2 98%   BMI 28.46 kg/m?   ? ?Subjective:  ? ? Patient ID: Angela Martinez, female    DOB: 10/18/1951, 71 y.o.   MRN: [id number] ? ?Chief Complaint  ?Patient presents with  ?? Nasal Congestion  ?  Chest congestion  and fatigue for the past 2 weeks  ?? Urinary Frequency  ?  For past 3 days  ? ? ?HPI: ?Angela Martinez is a 71 y.o. female ? ?Urinary Frequency  ?This is a new problem. The current episode started in the past 7 days. The quality of the pain is described as burning. Associated symptoms include chills and frequency. Pertinent negatives include no discharge, flank pain, hematuria, hesitancy, nausea, possible pregnancy, sweats, urgency or vomiting.  ?URI  ?This is a new problem. The current episode started in the past 7 days. The maximum temperature recorded prior to her arrival was 100.4 - 100.9 F. Associated symptoms include congestion, coughing and a sore throat. Pertinent negatives include no abdominal pain, chest pain, diarrhea, dysuria, ear pain, headaches, joint pain, joint swelling, nausea, neck pain, plugged ear sensation, rash, rhinorrhea, sinus pain, sneezing, swollen glands, vomiting or wheezing.  ? ?Chief Complaint  ?Patient presents with  ?? Nasal Congestion  ?  Chest congestion  and fatigue for the past 2 weeks  ?? Urinary Frequency  ?  For past 3 days  ? ? ?Relevant past medical, surgical, family and social history reviewed and updated as indicated. Interim medical history since our last visit reviewed. ?Allergies and medications reviewed and updated. ? ?Review of Systems  ?Constitutional:  Positive for chills.  ?HENT:  Positive for congestion and sore throat. Negative for ear pain, rhinorrhea, sinus pain and sneezing.   ?Respiratory:  Positive for cough. Negative for wheezing.   ?Cardiovascular:  Negative for chest pain.  ?Gastrointestinal:  Negative for abdominal pain, diarrhea, nausea and  vomiting.  ?Genitourinary:  Positive for frequency. Negative for dysuria, flank pain, hematuria, hesitancy and urgency.  ?Musculoskeletal:  Negative for joint pain and neck pain.  ?Skin:  Negative for rash.  ?Neurological:  Negative for headaches.  ? ?Per HPI unless specifically indicated above ? ?   ?Objective:  ?  ?BP 114/77   Pulse 75   Temp 98.4 ?F (36.9 ?C) (Oral)   Ht '5\' 5"'$  (1.651 m)   Wt 171 lb (77.6 kg)   SpO2 98%   BMI 28.46 kg/m?   ?Wt Readings from Last 3 Encounters:  ?03/14/22 171 lb (77.6 kg)  ?12/26/21 173 lb 6.4 oz (78.7 kg)  ?11/01/21 169 lb 3.2 oz (76.7 kg)  ?  ?Physical Exam ?Vitals and nursing note reviewed.  ?Constitutional:   ?   General: She is not in acute distress. ?   Appearance: Normal appearance. She is not ill-appearing or diaphoretic.  ?Eyes:  ?   Conjunctiva/sclera: Conjunctivae normal.  ?Cardiovascular:  ?   Rate and Rhythm: Normal rate.  ?Pulmonary:  ?   Effort: No respiratory distress.  ?   Breath sounds: No stridor. No rhonchi.  ?Abdominal:  ?   General: Bowel sounds are normal. There is no distension.  ?   Palpations: Abdomen is soft.  ?   Tenderness: There is no abdominal tenderness. There is no guarding.  ?   Hernia: No hernia is present.  ?Skin: ?  General: Skin is warm.  ?   Coloration: Skin is not jaundiced.  ?   Findings: No erythema.  ?Neurological:  ?   Mental Status: She is alert.  ? ? ?Results for orders placed or performed in visit on 12/26/21  ?Urine Culture  ? Specimen: Urine  ? UR  ?Result Value Ref Range  ? Urine Culture, Routine Final report (A)   ? Organism ID, Bacteria Escherichia coli (A)   ? Antimicrobial Susceptibility Comment   ?Microscopic Examination  ? Urine  ?Result Value Ref Range  ? WBC, UA 6-10 (A) 0 - 5 /hpf  ? RBC 0-2 0 - 2 /hpf  ? Epithelial Cells (non renal) 0-10 0 - 10 /hpf  ? Bacteria, UA Many (A) None seen/Few  ?Urinalysis, Routine w reflex microscopic  ?Result Value Ref Range  ? Specific Gravity, UA 1.015 1.005 - 1.030  ? pH, UA 5.5 5.0 -  7.5  ? Color, UA Yellow Yellow  ? Appearance Ur Cloudy (A) Clear  ? Leukocytes,UA 2+ (A) Negative  ? Protein,UA Negative Negative/Trace  ? Glucose, UA Negative Negative  ? Ketones, UA Negative Negative  ? RBC, UA Trace (A) Negative  ? Bilirubin, UA Negative Negative  ? Urobilinogen, Ur 0.2 0.2 - 1.0 mg/dL  ? Nitrite, UA Positive (A) Negative  ? Microscopic Examination See below:   ? ?   ? ? ?Current Outpatient Medications:  ??  alendronate (FOSAMAX) 70 MG tablet, Take 1 tablet (70 mg total) by mouth every 7 (seven) days. Take with a full glass of water on an empty stomach., Disp: 4 tablet, Rfl: 11 ??  BIOTIN PO, Take by mouth daily., Disp: , Rfl:  ??  ezetimibe (ZETIA) 10 MG tablet, Take 1 tablet (10 mg total) by mouth daily., Disp: 30 tablet, Rfl: 6 ??  FLUoxetine (PROZAC) 20 MG capsule, Take 1 tablet (20 mg total) by mouth daily., Disp: 90 capsule, Rfl: 0 ??  fluticasone (FLONASE) 50 MCG/ACT nasal spray, Place 2 sprays into both nostrils daily., Disp: 16 g, Rfl: 6 ??  gabapentin (NEURONTIN) 300 MG capsule, Take 2 capsules (600 mg total) by mouth at bedtime., Disp: 60 capsule, Rfl: 2 ??  loratadine (CLARITIN) 10 MG tablet, Take 10 mg by mouth daily., Disp: , Rfl:  ??  magnesium citrate SOLN, Take 1 Bottle by mouth once., Disp: , Rfl:  ??  NAPROXEN PO, Take by mouth as needed., Disp: , Rfl:  ??  omeprazole (PRILOSEC) 20 MG capsule, Take 1 capsule (20 mg total) by mouth daily., Disp: 90 capsule, Rfl: 0 ??  valACYclovir (VALTREX) 500 MG tablet, Take 1 tablet (500 mg total) by mouth as needed., Disp: 30 tablet, Rfl: 4 ??  Vitamin D, Cholecalciferol, 400 UNITS TABS, Take 1,000 Units by mouth. , Disp: , Rfl:  ??  MODERNA COVID-19 VACCINE 100 MCG/0.5ML injection, , Disp: , Rfl:   ? ? ?Assessment & Plan:  ? ?Recurrent UTI ?check UA.  pt is currently symptomatic for an Urinary tract infection(abd pain, burning etc), will cover with emperic abx, see med module for details.  encouraged to increase water/fluid intake.Signs and  symptoms of emergency were discussed with the patient. The risks, benefits and side effects of treatment were discussed with the patient. The patient verbalized an understanding of plan, and was told to call the clinic/go to the ED if symptoms worsen at any point of time. ? ? ?URI :URI: Flu and strep ordered at this visit, both negative. pt advised to take Tylenol  q 4- 6 hourly as needed. pt to take allegra q pm as needed and to call office if symptoms worsened pt verbalised understanding of such. ? ? ? ?Problem List Items Addressed This Visit   ? ?  ? Other  ? Frequency of urination - Primary  ? Relevant Orders  ? Urine Culture  ? Urinalysis, Routine w reflex microscopic  ? ?Other Visit Diagnoses   ? ? Upper respiratory tract infection, unspecified type      ? Relevant Orders  ? Veritor Flu A/B Waived  ? Rapid Strep Screen ([facility] ONLY)  ? Novel Coronavirus, NAA ([facility])  ? ?  ?  ? ?Orders Placed This Encounter  ?Procedures  ?? Rapid Strep Screen ([facility] ONLY)  ?? Novel Coronavirus, NAA ([facility])  ?? Urine Culture  ?? Veritor Flu A/B Waived  ?? Urinalysis, Routine w reflex microscopic  ?  ? ?No orders of the defined types were placed in this encounter. ?  ? ?Follow up plan: ?No follow-ups on file. ? ?

## 2022-03-16 LAB — NOVEL CORONAVIRUS, NAA: SARS-CoV-2, NAA: NOT DETECTED

## 2022-03-17 LAB — URINALYSIS, ROUTINE W REFLEX MICROSCOPIC
Bilirubin, UA: NEGATIVE
Glucose, UA: NEGATIVE
Ketones, UA: NEGATIVE
Nitrite, UA: POSITIVE — AB
Specific Gravity, UA: 1.015 (ref 1.005–1.030)
Urobilinogen, Ur: 0.2 mg/dL (ref 0.2–1.0)
pH, UA: 7 (ref 5.0–7.5)

## 2022-03-17 LAB — CULTURE, GROUP A STREP: Strep A Culture: NEGATIVE

## 2022-03-17 LAB — RAPID STREP SCREEN (MED CTR MEBANE ONLY): Strep Gp A Ag, IA W/Reflex: NEGATIVE

## 2022-03-17 LAB — MICROSCOPIC EXAMINATION: WBC, UA: >30 /hpf — ABNORMAL HIGH (ref 0–5)

## 2022-03-17 LAB — VERITOR FLU A/B WAIVED
Influenza A: NEGATIVE
Influenza B: NEGATIVE

## 2022-03-18 LAB — URINE CULTURE

## 2022-03-21 NOTE — Chronic Care Management (AMB) (Signed)
?  Chronic Care Management ?Note ? ?03/21/2022 ?Name: Angela Martinez MRN: [id number] DOB: 05-24-1951 ? ?Angela Martinez is a 71 y.o. year old female who is a primary care patient of [name], [name], MD and is actively engaged with the care management team. I reached out to [employer] by phone today to assist with re-scheduling a follow up visit with the RN Case Manager ? ?Follow up plan: ?Telephone appointment with care management team member scheduled for:04/25/2022 ? ?Noreene Larsson, RMA ?Care Guide, Embedded Care Coordination ?[facility]  [facility] Management  ?[place], [place] [address] ?Direct Dial: [phone] ?Museum/gallery conservator.[email or web address] ?Website: [email or web address]  ? ?

## 2022-03-23 DIAGNOSIS — R9389 Abnormal findings on diagnostic imaging of other specified body structures: Secondary | ICD-10-CM | POA: Diagnosis not present

## 2022-03-23 DIAGNOSIS — G500 Trigeminal neuralgia: Secondary | ICD-10-CM | POA: Diagnosis not present

## 2022-03-27 ENCOUNTER — Ambulatory Visit: Payer: PPO | Admitting: Internal Medicine

## 2022-03-28 ENCOUNTER — Ambulatory Visit (INDEPENDENT_AMBULATORY_CARE_PROVIDER_SITE_OTHER): Payer: PPO | Admitting: Internal Medicine

## 2022-03-28 ENCOUNTER — Encounter: Payer: Self-pay | Admitting: Internal Medicine

## 2022-03-28 VITALS — BP 136/82 | HR 72 | Temp 97.7°F | Ht 65.0 in | Wt 172.0 lb

## 2022-03-28 DIAGNOSIS — R7989 Other specified abnormal findings of blood chemistry: Secondary | ICD-10-CM

## 2022-03-28 DIAGNOSIS — I10 Essential (primary) hypertension: Secondary | ICD-10-CM

## 2022-03-28 DIAGNOSIS — E785 Hyperlipidemia, unspecified: Secondary | ICD-10-CM | POA: Diagnosis not present

## 2022-03-28 LAB — URINALYSIS, ROUTINE W REFLEX MICROSCOPIC
Bilirubin, UA: NEGATIVE
Glucose, UA: NEGATIVE
Ketones, UA: NEGATIVE
Nitrite, UA: NEGATIVE
Protein,UA: NEGATIVE
RBC, UA: NEGATIVE
Specific Gravity, UA: 1.015 (ref 1.005–1.030)
Urobilinogen, Ur: 0.2 mg/dL (ref 0.2–1.0)
pH, UA: 6.5 (ref 5.0–7.5)

## 2022-03-28 LAB — MICROSCOPIC EXAMINATION: RBC, Urine: NONE SEEN /hpf (ref 0–2)

## 2022-03-28 MED ORDER — FEXOFENADINE HCL 180 MG PO TABS: 180.0000 mg | ORAL_TABLET | Freq: Every day | ORAL | 1 refills | Status: DC

## 2022-03-28 NOTE — Patient Instructions (Signed)

## 2022-03-29 DIAGNOSIS — R7989 Other specified abnormal findings of blood chemistry: Secondary | ICD-10-CM | POA: Insufficient documentation

## 2022-03-29 LAB — CBC WITH DIFFERENTIAL/PLATELET
Basophils Absolute: 0 x10E3/uL (ref 0.0–0.2)
Basos: 1 %
EOS (ABSOLUTE): 0.2 x10E3/uL (ref 0.0–0.4)
Eos: 3 %
Hematocrit: 42.8 % (ref 34.0–46.6)
Hemoglobin: 13.1 g/dL (ref 11.1–15.9)
Immature Grans (Abs): 0 x10E3/uL (ref 0.0–0.1)
Immature Granulocytes: 0 %
Lymphocytes Absolute: 2.1 x10E3/uL (ref 0.7–3.1)
Lymphs: 36 %
MCH: 25 pg — ABNORMAL LOW (ref 26.6–33.0)
MCHC: 30.6 g/dL — ABNORMAL LOW (ref 31.5–35.7)
MCV: 82 fL (ref 79–97)
Monocytes Absolute: 0.4 x10E3/uL (ref 0.1–0.9)
Monocytes: 7 %
Neutrophils Absolute: 3.2 x10E3/uL (ref 1.4–7.0)
Neutrophils: 53 %
Platelets: 325 x10E3/uL (ref 150–450)
RBC: 5.24 x10E6/uL (ref 3.77–5.28)
RDW: 16.1 % — ABNORMAL HIGH (ref 11.7–15.4)
WBC: 6 x10E3/uL (ref 3.4–10.8)

## 2022-03-29 LAB — COMPREHENSIVE METABOLIC PANEL WITH GFR
ALT: 12 IU/L (ref 0–32)
AST: 19 IU/L (ref 0–40)
Albumin/Globulin Ratio: 1.6 (ref 1.2–2.2)
Albumin: 4.1 g/dL (ref 3.7–4.7)
Alkaline Phosphatase: 52 IU/L (ref 44–121)
BUN/Creatinine Ratio: 16 (ref 12–28)
BUN: 16 mg/dL (ref 8–27)
Bilirubin Total: 0.3 mg/dL (ref 0.0–1.2)
CO2: 23 mmol/L (ref 20–29)
Calcium: 9.4 mg/dL (ref 8.7–10.3)
Chloride: 106 mmol/L (ref 96–106)
Creatinine, Ser: 0.97 mg/dL (ref 0.57–1.00)
Globulin, Total: 2.6 g/dL (ref 1.5–4.5)
Glucose: 88 mg/dL (ref 70–99)
Potassium: 5.2 mmol/L (ref 3.5–5.2)
Sodium: 142 mmol/L (ref 134–144)
Total Protein: 6.7 g/dL (ref 6.0–8.5)
eGFR: 62 mL/min/1.73 (ref >59)

## 2022-03-29 LAB — TSH: TSH: 5.65 u[IU]/mL — ABNORMAL HIGH (ref 0.450–4.500)

## 2022-03-29 LAB — LIPID PANEL
Chol/HDL Ratio: 3.8 ratio (ref 0.0–4.4)
Cholesterol, Total: 196 mg/dL (ref 100–199)
HDL: 51 mg/dL (ref >39)
LDL Chol Calc (NIH): 122 mg/dL — ABNORMAL HIGH (ref 0–99)
Triglycerides: 130 mg/dL (ref 0–149)
VLDL Cholesterol Cal: 23 mg/dL (ref 5–40)

## 2022-03-29 NOTE — Progress Notes (Signed)
? ?BP 136/82   Pulse 72   Temp 97.7 ?F (36.5 ?C) (Oral)   Ht '5\' 5"'  (1.651 m)   Wt 172 lb (78 kg)   SpO2 99%   BMI 28.62 kg/m?   ? ?Subjective:  ? ? Patient ID: Angela Martinez, female    DOB: 04-03-1951, 71 y.o.   MRN: [id number] ? ?Chief Complaint  ?Patient presents with  ? Depression  ? Anxiety  ? Hyperlipidemia  ? Hypothyroidism  ? Cough  ?  Has a dry cough since she was last time.   ? ? ?HPI: ?Angela Martinez is a 71 y.o. female ? ?Hyperlipidemia ?This is a chronic problem. Pertinent negatives include no chest pain or myalgias.  ?Cough ?This is a recurrent problem. The current episode started yesterday. The problem has been waxing and waning. The cough is Non-productive. Associated symptoms include postnasal drip. Pertinent negatives include no chest pain, chills, ear congestion, ear pain, fever, headaches, heartburn, hemoptysis, myalgias, nasal congestion or rash.  ? ?Chief Complaint  ?Patient presents with  ? Depression  ? Anxiety  ? Hyperlipidemia  ? Hypothyroidism  ? Cough  ?  Has a dry cough since she was last time.   ? ? ?Relevant past medical, surgical, family and social history reviewed and updated as indicated. Interim medical history since our last visit reviewed. ?Allergies and medications reviewed and updated. ? ?Review of Systems  ?Constitutional:  Negative for chills and fever.  ?HENT:  Positive for postnasal drip. Negative for ear pain.   ?Respiratory:  Positive for cough. Negative for hemoptysis.   ?Cardiovascular:  Negative for chest pain.  ?Gastrointestinal:  Negative for heartburn.  ?Musculoskeletal:  Negative for myalgias.  ?Skin:  Negative for rash.  ?Neurological:  Negative for headaches.  ? ?Per HPI unless specifically indicated above ? ?   ?Objective:  ?  ?BP 136/82   Pulse 72   Temp 97.7 ?F (36.5 ?C) (Oral)   Ht '5\' 5"'  (1.651 m)   Wt 172 lb (78 kg)   SpO2 99%   BMI 28.62 kg/m?   ?Wt Readings from Last 3 Encounters:  ?03/28/22 172 lb (78 kg)  ?03/14/22 171 lb (77.6 kg)  ?12/26/21 173 lb  6.4 oz (78.7 kg)  ?  ?Physical Exam ?Vitals and nursing note reviewed.  ?Constitutional:   ?   General: She is not in acute distress. ?   Appearance: Normal appearance. She is not ill-appearing or diaphoretic.  ?Eyes:  ?   Conjunctiva/sclera: Conjunctivae normal.  ?Pulmonary:  ?   Breath sounds: No rhonchi.  ?Abdominal:  ?   General: Abdomen is flat. Bowel sounds are normal. There is no distension.  ?   Palpations: Abdomen is soft. There is no mass.  ?   Tenderness: There is no abdominal tenderness. There is no guarding.  ?Skin: ?   General: Skin is warm and dry.  ?   Coloration: Skin is not jaundiced.  ?   Findings: No erythema.  ?Neurological:  ?   Mental Status: She is alert.  ? ? ?Results for orders placed or performed in visit on 03/28/22  ?Microscopic Examination  ? Urine  ?Result Value Ref Range  ? WBC, UA 0-5 0 - 5 /hpf  ? RBC None seen 0 - 2 /hpf  ? Epithelial Cells (non renal) 0-10 0 - 10 /hpf  ? Mucus, UA Present (A) Not Estab.  ? Bacteria, UA Few (A) None seen/Few  ?CBC with Differential/Platelet  ?Result Value Ref Range  ?  WBC 6.0 3.4 - 10.8 x10E3/uL  ? RBC 5.24 3.77 - 5.28 x10E6/uL  ? Hemoglobin 13.1 11.1 - 15.9 g/dL  ? Hematocrit 42.8 34.0 - 46.6 %  ? MCV 82 79 - 97 fL  ? MCH 25.0 (L) 26.6 - 33.0 pg  ? MCHC 30.6 (L) 31.5 - 35.7 g/dL  ? RDW 16.1 (H) 11.7 - 15.4 %  ? Platelets 325 150 - 450 x10E3/uL  ? Neutrophils 53 Not Estab. %  ? Lymphs 36 Not Estab. %  ? Monocytes 7 Not Estab. %  ? Eos 3 Not Estab. %  ? Basos 1 Not Estab. %  ? Neutrophils Absolute 3.2 1.4 - 7.0 x10E3/uL  ? Lymphocytes Absolute 2.1 0.7 - 3.1 x10E3/uL  ? Monocytes Absolute 0.4 0.1 - 0.9 x10E3/uL  ? EOS (ABSOLUTE) 0.2 0.0 - 0.4 x10E3/uL  ? Basophils Absolute 0.0 0.0 - 0.2 x10E3/uL  ? Immature Granulocytes 0 Not Estab. %  ? Immature Grans (Abs) 0.0 0.0 - 0.1 x10E3/uL  ?Comprehensive metabolic panel  ?Result Value Ref Range  ? Glucose 88 70 - 99 mg/dL  ? BUN 16 8 - 27 mg/dL  ? Creatinine, Ser 0.97 0.57 - 1.00 mg/dL  ? eGFR 62 >59  mL/min/1.73  ? BUN/Creatinine Ratio 16 12 - 28  ? Sodium 142 134 - 144 mmol/L  ? Potassium 5.2 3.5 - 5.2 mmol/L  ? Chloride 106 96 - 106 mmol/L  ? CO2 23 20 - 29 mmol/L  ? Calcium 9.4 8.7 - 10.3 mg/dL  ? Total Protein 6.7 6.0 - 8.5 g/dL  ? Albumin 4.1 3.7 - 4.7 g/dL  ? Globulin, Total 2.6 1.5 - 4.5 g/dL  ? Albumin/Globulin Ratio 1.6 1.2 - 2.2  ? Bilirubin Total 0.3 0.0 - 1.2 mg/dL  ? Alkaline Phosphatase 52 44 - 121 IU/L  ? AST 19 0 - 40 IU/L  ? ALT 12 0 - 32 IU/L  ?Urinalysis, Routine w reflex microscopic  ?Result Value Ref Range  ? Specific Gravity, UA 1.015 1.005 - 1.030  ? pH, UA 6.5 5.0 - 7.5  ? Color, UA Yellow Yellow  ? Appearance Ur Clear Clear  ? Leukocytes,UA Trace (A) Negative  ? Protein,UA Negative Negative/Trace  ? Glucose, UA Negative Negative  ? Ketones, UA Negative Negative  ? RBC, UA Negative Negative  ? Bilirubin, UA Negative Negative  ? Urobilinogen, Ur 0.2 0.2 - 1.0 mg/dL  ? Nitrite, UA Negative Negative  ? Microscopic Examination See below:   ?TSH  ?Result Value Ref Range  ? TSH 5.650 (H) 0.450 - 4.500 uIU/mL  ?Lipid panel  ?Result Value Ref Range  ? Cholesterol, Total 196 100 - 199 mg/dL  ? Triglycerides 130 0 - 149 mg/dL  ? HDL 51 >39 mg/dL  ? VLDL Cholesterol Cal 23 5 - 40 mg/dL  ? LDL Chol Calc (NIH) 122 (H) 0 - 99 mg/dL  ? Chol/HDL Ratio 3.8 0.0 - 4.4 ratio  ? ?   ? ? ?Current Outpatient Medications:  ?  alendronate (FOSAMAX) 70 MG tablet, Take 1 tablet (70 mg total) by mouth every 7 (seven) days. Take with a full glass of water on an empty stomach., Disp: 4 tablet, Rfl: 11 ?  BIOTIN PO, Take by mouth daily., Disp: , Rfl:  ?  ezetimibe (ZETIA) 10 MG tablet, Take 1 tablet (10 mg total) by mouth daily., Disp: 30 tablet, Rfl: 6 ?  fexofenadine (ALLEGRA ALLERGY) 180 MG tablet, Take 1 tablet (180 mg total) by mouth daily., Disp:  10 tablet, Rfl: 1 ?  FLUoxetine (PROZAC) 20 MG capsule, Take 1 tablet (20 mg total) by mouth daily., Disp: 90 capsule, Rfl: 0 ?  gabapentin (NEURONTIN) 300 MG capsule,  Take 2 capsules (600 mg total) by mouth at bedtime., Disp: 60 capsule, Rfl: 2 ?  magnesium citrate SOLN, Take 1 Bottle by mouth once., Disp: , Rfl:  ?  MODERNA COVID-19 VACCINE 100 MCG/0.5ML injection, , Disp: , Rfl:  ?  NAPROXEN PO, Take by mouth as needed., Disp: , Rfl:  ?  omeprazole (PRILOSEC) 20 MG capsule, Take 1 capsule (20 mg total) by mouth daily., Disp: 90 capsule, Rfl: 0 ?  valACYclovir (VALTREX) 500 MG tablet, Take 1 tablet (500 mg total) by mouth as needed., Disp: 30 tablet, Rfl: 4 ?  Vitamin D, Cholecalciferol, 400 UNITS TABS, Take 1,000 Units by mouth. , Disp: , Rfl:   ? ? ?Assessment & Plan:  ?Angela Martinez ?Will start pt on allegra for lingering cough, s/p uri.  ? ? ?GERD is on prilosec for such ?GERD continue pantoprazole 40 mg q.day as prescribed ?patient advised to avoid laying down soon after his meals. He took a 2 hours between dinner and bedtime. Avoid spicy food and triggers that he knows food wise that worsen his acid reflux. Patient verbalized understanding of the above. Lifestyle modifications as above discussed with patient. ? ? ?Osteoporosis is on fosamax ?patient advised to take this once a month on empty stomach needs to sit upright for an hour after  ?ake this drug by mouth with a full glass of water. Take it as directed on the prescription label on the same day of each month.Take the dose right after waking up. Do not eat or drink anything before taking it. Do not take it with any other drink except water. Do not chew or crush the tablet. After taking it, do not eat breakfast, drink, or take any other drugs or vitamins for at least 30 minutes. Sit or stand up for at least 60 minutes after you take it. Do notlie down. Keep taking it unless your health care provider tells you to stop. ? ?Problem List Items Addressed This Visit   ? ?  ? Cardiovascular and Mediastinum  ? Essential hypertension  ? Relevant Medications  ? fexofenadine (ALLEGRA ALLERGY) 180 MG tablet  ? Other Relevant Orders  ? CBC  with Differential/Platelet (Completed)  ? Comprehensive metabolic panel (Completed)  ? Lipid panel  ? Urinalysis, Routine w reflex microscopic (Completed)  ? TSH (Completed)  ? Microscopic Examination (Completed)  ? Lipi

## 2022-04-04 DIAGNOSIS — H1013 Acute atopic conjunctivitis, bilateral: Secondary | ICD-10-CM | POA: Diagnosis not present

## 2022-04-04 DIAGNOSIS — H04123 Dry eye syndrome of bilateral lacrimal glands: Secondary | ICD-10-CM | POA: Diagnosis not present

## 2022-04-04 DIAGNOSIS — H43813 Vitreous degeneration, bilateral: Secondary | ICD-10-CM | POA: Diagnosis not present

## 2022-04-04 DIAGNOSIS — Z961 Presence of intraocular lens: Secondary | ICD-10-CM | POA: Diagnosis not present

## 2022-04-10 ENCOUNTER — Other Ambulatory Visit: Payer: Self-pay | Admitting: Internal Medicine

## 2022-04-10 DIAGNOSIS — K219 Gastro-esophageal reflux disease without esophagitis: Secondary | ICD-10-CM

## 2022-04-11 NOTE — Telephone Encounter (Signed)
Requested Prescriptions  Pending Prescriptions Disp Refills  . FLUoxetine (PROZAC) 20 MG capsule [Pharmacy Med Name: FLUOXETINE HCL 20 MG CAPSULE] 90 capsule 0    Sig: TAKE 1 CAPSULE total) by mouth daily.     Psychiatry:  Antidepressants - SSRI Passed - 04/10/2022 12:27 PM      Passed - Completed PHQ-2 or PHQ-9 in the last 360 days      Passed - Valid encounter within last 6 months    Recent Outpatient Visits          2 weeks ago Hyperlipidemia, unspecified hyperlipidemia type   [place] [name], [name], MD   4 weeks ago Frequency of urination   [place] [name], [name], MD   3 months ago Frequency of urination   [place] [name], [name], MD   5 months ago Hyperlipidemia, unspecified hyperlipidemia type   [facility] [name], [name], MD   7 months ago Hyperlipidemia, unspecified hyperlipidemia type   [facility] [name], [name], MD             . omeprazole (PRILOSEC) 20 MG capsule [Pharmacy Med Name: OMEPRAZOLE DR 20 MG CAPSULE] 90 capsule 0    Sig: Take 1 capsule (20 mg total) by mouth daily.     Gastroenterology: Proton Pump Inhibitors Passed - 04/10/2022 12:27 PM      Passed - Valid encounter within last 12 months    Recent Outpatient Visits          2 weeks ago Hyperlipidemia, unspecified hyperlipidemia type   [facility] [name], [name], MD   4 weeks ago Frequency of urination   [place] [name], [name], MD   3 months ago Frequency of urination   [place] [name], [name], MD   5 months ago Hyperlipidemia, unspecified hyperlipidemia type   [facility] [name], [name], MD   7 months ago Hyperlipidemia, unspecified hyperlipidemia type   [facility] [name], [name], MD

## 2022-04-25 ENCOUNTER — Telehealth: Payer: PPO

## 2022-04-25 ENCOUNTER — Telehealth: Payer: Self-pay

## 2022-04-25 ENCOUNTER — Ambulatory Visit (INDEPENDENT_AMBULATORY_CARE_PROVIDER_SITE_OTHER): Payer: PPO

## 2022-04-25 DIAGNOSIS — F320 Major depressive disorder, single episode, mild: Secondary | ICD-10-CM

## 2022-04-25 DIAGNOSIS — E039 Hypothyroidism, unspecified: Secondary | ICD-10-CM

## 2022-04-25 DIAGNOSIS — E785 Hyperlipidemia, unspecified: Secondary | ICD-10-CM

## 2022-04-25 DIAGNOSIS — M549 Dorsalgia, unspecified: Secondary | ICD-10-CM

## 2022-04-25 DIAGNOSIS — G500 Trigeminal neuralgia: Secondary | ICD-10-CM

## 2022-04-25 DIAGNOSIS — I10 Essential (primary) hypertension: Secondary | ICD-10-CM

## 2022-04-25 DIAGNOSIS — F32A Depression, unspecified: Secondary | ICD-10-CM

## 2022-04-25 DIAGNOSIS — M797 Fibromyalgia: Secondary | ICD-10-CM

## 2022-04-25 DIAGNOSIS — M159 Polyosteoarthritis, unspecified: Secondary | ICD-10-CM

## 2022-04-25 NOTE — Patient Instructions (Signed)
Visit Information  Thank you for taking time to visit with me today. Please don't hesitate to contact me if I can be of assistance to you before our next scheduled telephone appointment.  Following are the goals we discussed today:  Depression  (Status: New goal. Goal on track: YES.) Evaluation of current treatment plan related to Depression,  self-management and patient's adherence to plan as established by provider. 04-25-2022: The patient is doing better. She is happy that her MRI was good. She states that she has not heard from the results but was able to tell her to call Dr. Melrose Nakayama for finalized results but looked like a normal scan. She has not had any other facial pain since she has had the MRI. Discussed plans with patient for ongoing care management follow up and provided patient with direct contact information for care management team Advised patient to call the office for changes in mood, anxiety, or depression; Provided education to patient re: CCM team available to assist in the management of her chronic conditions and help her maintain her health and well being ; Reviewed medications with patient and discussed compliance. 04-25-2022: The patient is compliant with her medications; Provided patient with grief and depression  educational materials related to depression and grief ; Reviewed scheduled/upcoming provider appointments including saw the pcp on 03-28-2022; Discussed plans with patient for ongoing care management follow up and provided patient with direct contact information for care management team; Advised patient to discuss about the fatty "lumps" she has all over her body with provider; Screening for signs and symptoms of depression related to chronic disease state;  Assessed social determinant of health barriers;    Hyperlipidemia:  (Status: New goal. Goal on track: NO.)      Lab Results  Component Value Date    CHOL 196 03/28/2022    HDL 51 03/28/2022    LDLCALC 122 (H)  03/28/2022    TRIG 130 03/28/2022    CHOLHDL 3.8 03/28/2022      Medication review performed; medication list updated in electronic medical record. 04-25-2022: takes Zetia 10 mg daily Provider established cholesterol goals reviewed. 04-25-2022: The patient is at goal. The patient is doing well with management of her HLD. Counseled on importance of regular laboratory monitoring as prescribed. 04-25-2022: the patient has regular labwork; Provided HLD educational materials; Reviewed role and benefits of statin for ASCVD risk reduction; Discussed strategies to manage statin-induced myalgias; Reviewed importance of limiting foods high in cholesterol. 04-25-2022: The patient is compliant with a heart healthy diet; Reviewed exercise goals and target of 150 minutes per week. 04-25-2022: The patient is walking daily. The pateint states she is working up to a goal of 1000 plus    Hypertension: (Status: New goal. Goal on track: YES.) Last practice recorded BP readings:     BP Readings from Last 3 Encounters:  03/28/22 136/82  03/14/22 114/77  12/26/21 124/82  Most recent eGFR/CrCl:       Lab Results  Component Value Date    EGFR 62 03/28/2022    No components found for: CRCL   Evaluation of current treatment plan related to hypertension self management and patient's adherence to plan as established by provider. 04-25-2022: The patient has good control of her HTN and denies any issues with HTN or heart health.    Provided education to patient re: stroke prevention, s/s of heart attack and stroke; Reviewed prescribed diet heart healthy diet. 04-25-2022: compliant with heart healthy diet  Reviewed medications with patient  and discussed importance of compliance. 04-25-2022: The patient is compliant with medications.  Discussed plans with patient for ongoing care management follow up and provided patient with direct contact information for care management team; Advised patient, providing education and rationale, to  monitor blood pressure daily and record, calling PCP for findings outside established parameters;  Reviewed scheduled/upcoming provider appointments including: saw 03-28-2022- reminder given to call the specialist for follow up.  Provided education on prescribed diet heart healthy;  Discussed complications of poorly controlled blood pressure such as heart disease, stroke, circulatory complications, vision complications, kidney impairment, sexual dysfunction;    Pain and fibromyalgia:  (Status: New goal. Goal on track: YES.) Pain assessment performed. 04-25-2022: Denies pain or discomfort today. States she has not had any additional pain since she had the MRI.  Medications reviewed. 04-25-2022: Is compliant with medications Reviewed provider established plan for pain management. 04-25-2022: Is compliant with the plan of care Discussed importance of adherence to all scheduled medical appointments; Counseled on the importance of reporting any/all new or changed pain symptoms or management strategies to pain management provider; Advised patient to report to care team affect of pain on daily activities; Discussed use of relaxation techniques and/or diversional activities to assist with pain reduction (distraction, imagery, relaxation, massage, acupressure, TENS, heat, and cold application; Reviewed with patient prescribed pharmacological and nonpharmacological pain relief strategies; Advised patient to discuss changes in level or intensity of pain, or unresolved pain with provider;   Patient Goals/Self-Care Activities: Patient will self administer medications as prescribed as evidenced by self report/primary caregiver report  Patient will attend all scheduled provider appointments as evidenced by clinician review of documented attendance to scheduled appointments and patient/caregiver report Patient will call pharmacy for medication refills as evidenced by patient report and review of pharmacy fill history as  appropriate Patient will attend church or other social activities as evidenced by patient report Patient will continue to perform ADL's independently as evidenced by patient/caregiver report Patient will continue to perform IADL's independently as evidenced by patient/caregiver report Patient will call provider office for new concerns or questions as evidenced by review of documented incoming telephone call notes and patient report Patient will work with BSW to address care coordination needs and will continue to work with the clinical team to address health care and disease management related needs as evidenced by documented adherence to scheduled care management/care coordination appointments - check blood pressure weekly - choose a place to take my blood pressure (home, clinic or office, retail store) - write blood pressure results in a log or diary - learn about high blood pressure - keep a blood pressure log - take blood pressure log to all doctor appointments - call doctor for signs and symptoms of high blood pressure - develop an action plan for high blood pressure - keep all doctor appointments - take medications for blood pressure exactly as prescribed - report new symptoms to your doctor - eat more whole grains, fruits and vegetables, lean meats and healthy fats - call for medicine refill 2 or 3 days before it runs out - take all medications exactly as prescribed - call doctor with any symptoms you believe are related to your medicine - call doctor when you experience any new symptoms - go to all doctor appointments as scheduled - adhere to prescribed diet: heart healthy    Our next appointment is by telephone on 07-05-2022 at 230 pm  Please call the care guide team at [phone] if you need to  cancel or reschedule your appointment.   If you are experiencing a Mental Health or [place] or need someone to talk to, please call the Suicide and Crisis Lifeline:  988 call the [place] National Suicide Prevention Lifeline: (971)091-8830 or TTY: 603-721-3961 TTY 218-077-7650) to talk to a trained counselor call 1-800-273-TALK (toll free, 24 hour hotline)   The patient verbalized understanding of instructions, educational materials, and care plan provided today and DECLINED offer to receive copy of patient instructions, educational materials, and care plan.   Noreene Larsson RN, MSN, [facility] Mobile: [phone]

## 2022-04-25 NOTE — Telephone Encounter (Signed)
  Care Management   Follow Up Note   04/25/2022 Name: Angela Martinez MRN: [id number] DOB: Apr 07, 1951   Referred by: Charlynne Cousins, MD Reason for referral : Chronic Care Management (RNCM: Follow up for Chronic Disease Management and Care Coordination Needs)   An unsuccessful telephone outreach was attempted today. The patient was referred to the case management team for assistance with care management and care coordination.   Follow Up Plan: A HIPPA compliant phone message was left for the patient providing contact information and requesting a return call.   Noreene Larsson RN, MSN, [facility] Mobile: [phone]

## 2022-04-25 NOTE — Chronic Care Management (AMB) (Signed)
Chronic Care Management   CCM RN Visit Note  04/25/2022 Name: Angela Martinez MRN: [id number] DOB: [birth year]  Subjective: Angela Martinez is a 71 y.o. year old female who is a primary care patient of [name], [name], MD. The care management team was consulted for assistance with disease management and care coordination needs.    Engaged with patient by telephone for follow up visit in response to provider referral for case management and/or care coordination services.   Consent to Services:  The patient was given information about Chronic Care Management services, agreed to services, and gave verbal consent prior to initiation of services.  Please see initial visit note for detailed documentation.   Patient agreed to services and verbal consent obtained.   Assessment: Review of patient past medical history, allergies, medications, health status, including review of consultants reports, laboratory and other test data, was performed as part of comprehensive evaluation and provision of chronic care management services.   SDOH (Social Determinants of Health) assessments and interventions performed:    CCM Care Plan  Allergies  Allergen Reactions   Atorvastatin     Muscle cramps   Crestor [Rosuvastatin Calcium]     Muscle aches   Elemental Sulfur    Lyrica [Pregabalin] Other (See Comments)    Spaced out   Amoxicillin-Pot Clavulanate Nausea Only    Outpatient Encounter Medications as of 04/25/2022  Medication Sig Note   alendronate (FOSAMAX) 70 MG tablet Take 1 tablet (70 mg total) by mouth every 7 (seven) days. Take with a full glass of water on an empty stomach.    BIOTIN PO Take by mouth daily.    ezetimibe (ZETIA) 10 MG tablet Take 1 tablet (10 mg total) by mouth daily.    fexofenadine (ALLEGRA ALLERGY) 180 MG tablet Take 1 tablet (180 mg total) by mouth daily.    FLUoxetine (PROZAC) 20 MG capsule TAKE 1 CAPSULE total) by mouth daily.    gabapentin (NEURONTIN) 300 MG capsule Take 2 capsules  (600 mg total) by mouth at bedtime.    magnesium citrate SOLN Take 1 Bottle by mouth once.    MODERNA COVID-19 VACCINE 100 MCG/0.5ML injection     NAPROXEN PO Take by mouth as needed.    omeprazole (PRILOSEC) 20 MG capsule Take 1 capsule (20 mg total) by mouth daily.    valACYclovir (VALTREX) 500 MG tablet Take 1 tablet (500 mg total) by mouth as needed.    Vitamin D, Cholecalciferol, 400 UNITS TABS Take 1,000 Units by mouth.     [DISCONTINUED] fluticasone (FLONASE) 50 MCG/ACT nasal spray Place 2 sprays into both nostrils daily. 10/15/2019: As needed   No facility-administered encounter medications on file as of 04/25/2022.    Patient Active Problem List   Diagnosis Date Noted   Elevated TSH 03/29/2022   Upper respiratory tract infection 03/14/2022   Frequency of urination 12/26/2021   Back pain 12/26/2021   Trigeminal neuralgia 12/26/2021   Hypothyroidism 09/08/2021   Encounter for screening for malignant neoplasm of breast 06/22/2021   Diarrhea 06/22/2021   Myalgia due to statin 12/22/2020   Vitamin D deficiency 10/15/2019   Eczema 08/02/2018   Advanced care planning/counseling discussion 08/02/2017   Trigeminal neuralgia of left side of face 12/27/2015   Fibromyalgia    OA (osteoarthritis)    Osteoporosis    Hyperlipidemia    Essential hypertension    Obesity    GERD (gastroesophageal reflux disease)    Depression, major, single episode, mild ([place])  Conditions to be addressed/monitored:HTN, HLD, Depression, and fibromyalgia, hypothyroidism  Care Plan : RNCM: General Plan of Care (Adult) for Chronic Disease Management and Care Coordination Needs  Updates made by Vanita Ingles, RN since 04/25/2022 12:00 AM     Problem: RNCM: Development of Plan of Care for Chronic Disease Managment and Care Coordination needs (HTN, HLD, Fibromyalgia, Hypothyroidism, Depression)   Priority: High     Long-Range Goal: RNCM: Development of Plan of Care for Chronic Disease Managment and  Care Coordination needs (HTN, HLD, Fibromyalgia, Hypothyroidism, Depression   Start Date: 09/09/2021  Expected End Date: 09/09/2022  Priority: High  Note:   Current Barriers:  Knowledge Deficits related to plan of care for management of HTN, HLD, Depression: depressed mood Grief and loss of a child, grief over not being able to work and having to retire early, and fibromyalgia and chronic pain   Chronic Disease Management support and education needs related to HTN, HLD, Depression: depressed mood grief, and fibromyalgia and chronic pain  RNCM Clinical Goal(s):  Patient will verbalize understanding of plan for management of HTN, HLD, Depression, and Fibromyalgia and chronic pain  verbalize basic understanding of HTN, HLD, Depression, and Chronic pain and fibromyalgia disease process and self health management plan  take all medications exactly as prescribed and will call provider for medication related questions demonstrate understanding of rationale for each prescribed medication  attend all scheduled medical appointments: saw  the pcp 03-28-2022 demonstrate improved and ongoing health management independence  demonstrate a decrease in HTN, HLD, Depression, and chronic pain and fibromyalgia exacerbations  demonstrate ongoing self health care management ability by effective management of chronic conditions  through collaboration with RN Care manager, provider, and care team.   Interventions: 1:1 collaboration with primary care provider regarding development and update of comprehensive plan of care as evidenced by provider attestation and co-signature Inter-disciplinary care team collaboration (see longitudinal plan of care) Evaluation of current treatment plan related to  self management and patient's adherence to plan as established by provider   SDOH Barriers (Status: Goal on track: YES.)  Patient interviewed and SDOH assessment performed        SDOH Interventions    Flowsheet Row Most  Recent Value  SDOH Interventions   Food Insecurity Interventions Intervention Not Indicated  Financial Strain Interventions Intervention Not Indicated  Housing Interventions Intervention Not Indicated  Intimate Partner Violence Interventions Intervention Not Indicated  Physical Activity Interventions Intervention Not Indicated, Other (Comments)  [Active in her garden and yard work and does a lot of baking]  Stress Interventions Intervention Not Indicated  Social Connections Interventions Intervention Not Indicated  Transportation Interventions Intervention Not Indicated     Patient interviewed and appropriate assessments performed Provided patient with information about resources available for working with the CCM team Discussed plans with patient for ongoing care management follow up and provided patient with direct contact information for care management team Advised patient to call the office for changes in SDOH, questions, or concerns    Depression  (Status: New goal. Goal on track: YES.) Evaluation of current treatment plan related to Depression,  self-management and patient's adherence to plan as established by provider. 04-25-2022: The patient is doing better. She is happy that her MRI was good. She states that she has not heard from the results but was able to tell her to call Dr. Melrose Nakayama for finalized results but looked like a normal scan. She has not had any other facial pain since she has had the  MRI. Discussed plans with patient for ongoing care management follow up and provided patient with direct contact information for care management team Advised patient to call the office for changes in mood, anxiety, or depression; Provided education to patient re: CCM team available to assist in the management of her chronic conditions and help her maintain her health and well being ; Reviewed medications with patient and discussed compliance. 04-25-2022: The patient is compliant with her  medications; Provided patient with grief and depression  educational materials related to depression and grief ; Reviewed scheduled/upcoming provider appointments including saw the pcp on 03-28-2022; Discussed plans with patient for ongoing care management follow up and provided patient with direct contact information for care management team; Advised patient to discuss about the fatty "lumps" she has all over her body with provider; Screening for signs and symptoms of depression related to chronic disease state;  Assessed social determinant of health barriers;   Hyperlipidemia:  (Status: New goal. Goal on track: NO.) Lab Results  Component Value Date   CHOL 196 03/28/2022   HDL 51 03/28/2022   LDLCALC 122 (H) 03/28/2022   TRIG 130 03/28/2022   CHOLHDL 3.8 03/28/2022     Medication review performed; medication list updated in electronic medical record. 04-25-2022: takes Zetia 10 mg daily Provider established cholesterol goals reviewed. 04-25-2022: The patient is at goal. The patient is doing well with management of her HLD. Counseled on importance of regular laboratory monitoring as prescribed. 04-25-2022: the patient has regular labwork; Provided HLD educational materials; Reviewed role and benefits of statin for ASCVD risk reduction; Discussed strategies to manage statin-induced myalgias; Reviewed importance of limiting foods high in cholesterol. 04-25-2022: The patient is compliant with a heart healthy diet; Reviewed exercise goals and target of 150 minutes per week. 04-25-2022: The patient is walking daily. The pateint states she is working up to a goal of 1000 plus   Hypertension: (Status: New goal. Goal on track: YES.) Last practice recorded BP readings:  BP Readings from Last 3 Encounters:  03/28/22 136/82  03/14/22 114/77  12/26/21 124/82  Most recent eGFR/CrCl:  Lab Results  Component Value Date   EGFR 62 03/28/2022    No components found for: CRCL  Evaluation of current treatment  plan related to hypertension self management and patient's adherence to plan as established by provider. 04-25-2022: The patient has good control of her HTN and denies any issues with HTN or heart health.    Provided education to patient re: stroke prevention, s/s of heart attack and stroke; Reviewed prescribed diet heart healthy diet. 04-25-2022: compliant with heart healthy diet  Reviewed medications with patient and discussed importance of compliance. 04-25-2022: The patient is compliant with medications.  Discussed plans with patient for ongoing care management follow up and provided patient with direct contact information for care management team; Advised patient, providing education and rationale, to monitor blood pressure daily and record, calling PCP for findings outside established parameters;  Reviewed scheduled/upcoming provider appointments including: saw 03-28-2022- reminder given to call the specialist for follow up.  Provided education on prescribed diet heart healthy;  Discussed complications of poorly controlled blood pressure such as heart disease, stroke, circulatory complications, vision complications, kidney impairment, sexual dysfunction;   Pain and fibromyalgia:  (Status: New goal. Goal on track: YES.) Pain assessment performed. 04-25-2022: Denies pain or discomfort today. States she has not had any additional pain since she had the MRI.  Medications reviewed. 04-25-2022: Is compliant with medications Reviewed provider established plan for  pain management. 04-25-2022: Is compliant with the plan of care Discussed importance of adherence to all scheduled medical appointments; Counseled on the importance of reporting any/all new or changed pain symptoms or management strategies to pain management provider; Advised patient to report to care team affect of pain on daily activities; Discussed use of relaxation techniques and/or diversional activities to assist with pain reduction (distraction,  imagery, relaxation, massage, acupressure, TENS, heat, and cold application; Reviewed with patient prescribed pharmacological and nonpharmacological pain relief strategies; Advised patient to discuss changes in level or intensity of pain, or unresolved pain with provider;  Patient Goals/Self-Care Activities: Patient will self administer medications as prescribed as evidenced by self report/primary caregiver report  Patient will attend all scheduled provider appointments as evidenced by clinician review of documented attendance to scheduled appointments and patient/caregiver report Patient will call pharmacy for medication refills as evidenced by patient report and review of pharmacy fill history as appropriate Patient will attend church or other social activities as evidenced by patient report Patient will continue to perform ADL's independently as evidenced by patient/caregiver report Patient will continue to perform IADL's independently as evidenced by patient/caregiver report Patient will call provider office for new concerns or questions as evidenced by review of documented incoming telephone call notes and patient report Patient will work with BSW to address care coordination needs and will continue to work with the clinical team to address health care and disease management related needs as evidenced by documented adherence to scheduled care management/care coordination appointments - check blood pressure weekly - choose a place to take my blood pressure (home, clinic or office, retail store) - write blood pressure results in a log or diary - learn about high blood pressure - keep a blood pressure log - take blood pressure log to all doctor appointments - call doctor for signs and symptoms of high blood pressure - develop an action plan for high blood pressure - keep all doctor appointments - take medications for blood pressure exactly as prescribed - report new symptoms to your  doctor - eat more whole grains, fruits and vegetables, lean meats and healthy fats - call for medicine refill 2 or 3 days before it runs out - take all medications exactly as prescribed - call doctor with any symptoms you believe are related to your medicine - call doctor when you experience any new symptoms - go to all doctor appointments as scheduled - adhere to prescribed diet: heart healthy       Plan:Telephone follow up appointment with care management team member scheduled for:  07-05-2022 at 230 pm  [place], MSN, [facility] Mobile: [phone]

## 2022-04-26 ENCOUNTER — Telehealth: Payer: PPO

## 2022-05-08 ENCOUNTER — Other Ambulatory Visit: Payer: Self-pay | Admitting: Nurse Practitioner

## 2022-05-09 NOTE — Telephone Encounter (Signed)
Requested medication (s) are due for refill today:   Provider to review     Requested medication (s) are on the active medication list:   Yes as a discontinued med.  Future visit scheduled:   No   Seen a mo. ago   Last ordered: 05/07/2019 16 g, 6 refills by Marnee Guarneri,     3 yrs ago  Returned for provider review for refills since last time was 3 yrs ago.   Requested Prescriptions  Pending Prescriptions Disp Refills   fluticasone (FLONASE) 50 MCG/ACT nasal spray [Pharmacy Med Name: FLUTICASONE PROPIONATE NS] 16 g 0    Sig: Place 2 sprays into both nostrils daily.     Ear, Nose, and Throat: Nasal Preparations - Corticosteroids Passed - 05/08/2022  4:37 PM      Passed - Valid encounter within last 12 months    Recent Outpatient Visits           1 month ago Hyperlipidemia, unspecified hyperlipidemia type   [facility] [name], [name], MD   1 month ago Frequency of urination   [place] [name], [name], MD   4 months ago Frequency of urination   [place] [name], [name], MD   6 months ago Hyperlipidemia, unspecified hyperlipidemia type   [facility] [name], [name], MD   8 months ago Hyperlipidemia, unspecified hyperlipidemia type   [facility] [name], [name], MD

## 2022-05-12 ENCOUNTER — Other Ambulatory Visit: Payer: Self-pay | Admitting: Nurse Practitioner

## 2022-05-18 ENCOUNTER — Other Ambulatory Visit: Payer: Self-pay | Admitting: Internal Medicine

## 2022-05-18 NOTE — Telephone Encounter (Signed)
Medication Refill - Medication: fluticasone (FLONASE) 50 MCG/ACT nasal spray  Has the patient contacted their pharmacy? Yes.   Pt told to contact provider  Preferred Pharmacy (with phone number or street name):  [place], [place] Phone:  [phone]  Fax:  [phone]     Has the patient been seen for an appointment in the last year OR does the patient have an upcoming appointment? Yes.    Agent: Please be advised that RX refills may take up to 3 business days. We ask that you follow-up with your pharmacy.

## 2022-05-18 NOTE — Telephone Encounter (Signed)
Requested medication (s) are due for refill today -no  Requested medication (s) are on the active medication list -no  Future visit scheduled -no  Last refill: unsure  Notes to clinic: Call to patient- there have been several requests for this medication- patient states Dr Neomia Dear told her she needed to use it due to mucus build up in the back of her throat at her last visit. Patient would like to continue this medication. Advised I would relay information to PCP.  Requested Prescriptions  Pending Prescriptions Disp Refills   fluticasone (FLONASE) 50 MCG/ACT nasal spray 16 g 6    Sig: Place 2 sprays into both nostrils daily.     Ear, Nose, and Throat: Nasal Preparations - Corticosteroids Passed - 05/18/2022  2:16 PM      Passed - Valid encounter within last 12 months    Recent Outpatient Visits           1 month ago Hyperlipidemia, unspecified hyperlipidemia type   [facility] [name], [name], MD   2 months ago Frequency of urination   [place] [name], [name], MD   4 months ago Frequency of urination   [place] [name], [name], MD   6 months ago Hyperlipidemia, unspecified hyperlipidemia type   [facility] [name], [name], MD   8 months ago Hyperlipidemia, unspecified hyperlipidemia type   [facility] [name], [name], MD                 Requested Prescriptions  Pending Prescriptions Disp Refills   fluticasone (FLONASE) 50 MCG/ACT nasal spray 16 g 6    Sig: Place 2 sprays into both nostrils daily.     Ear, Nose, and Throat: Nasal Preparations - Corticosteroids Passed - 05/18/2022  2:16 PM      Passed - Valid encounter within last 12 months    Recent Outpatient Visits           1 month ago Hyperlipidemia, unspecified hyperlipidemia type   [facility] [name], [name], MD   2 months ago Frequency of urination   [place] [name], [name], MD   4 months ago Frequency of urination   [place] [name], [name], MD   6 months ago Hyperlipidemia, unspecified hyperlipidemia type   [facility] [name], [name], MD   8 months ago Hyperlipidemia, unspecified hyperlipidemia type   [facility] [name], [name], MD

## 2022-05-19 DIAGNOSIS — I10 Essential (primary) hypertension: Secondary | ICD-10-CM | POA: Diagnosis not present

## 2022-05-19 DIAGNOSIS — E785 Hyperlipidemia, unspecified: Secondary | ICD-10-CM

## 2022-05-19 DIAGNOSIS — F32A Depression, unspecified: Secondary | ICD-10-CM

## 2022-06-06 DIAGNOSIS — D2262 Melanocytic nevi of left upper limb, including shoulder: Secondary | ICD-10-CM | POA: Diagnosis not present

## 2022-06-06 DIAGNOSIS — C44519 Basal cell carcinoma of skin of other part of trunk: Secondary | ICD-10-CM | POA: Diagnosis not present

## 2022-06-06 DIAGNOSIS — D2272 Melanocytic nevi of left lower limb, including hip: Secondary | ICD-10-CM | POA: Diagnosis not present

## 2022-06-06 DIAGNOSIS — D485 Neoplasm of uncertain behavior of skin: Secondary | ICD-10-CM | POA: Diagnosis not present

## 2022-06-06 DIAGNOSIS — D2261 Melanocytic nevi of right upper limb, including shoulder: Secondary | ICD-10-CM | POA: Diagnosis not present

## 2022-06-06 DIAGNOSIS — L821 Other seborrheic keratosis: Secondary | ICD-10-CM | POA: Diagnosis not present

## 2022-06-06 DIAGNOSIS — D225 Melanocytic nevi of trunk: Secondary | ICD-10-CM | POA: Diagnosis not present

## 2022-06-20 DIAGNOSIS — C44519 Basal cell carcinoma of skin of other part of trunk: Secondary | ICD-10-CM | POA: Diagnosis not present

## 2022-07-03 ENCOUNTER — Telehealth: Payer: Self-pay | Admitting: Internal Medicine

## 2022-07-03 NOTE — Telephone Encounter (Signed)
error 

## 2022-07-04 ENCOUNTER — Other Ambulatory Visit: Payer: Self-pay

## 2022-07-04 DIAGNOSIS — M797 Fibromyalgia: Secondary | ICD-10-CM

## 2022-07-04 DIAGNOSIS — M549 Dorsalgia, unspecified: Secondary | ICD-10-CM

## 2022-07-04 DIAGNOSIS — R350 Frequency of micturition: Secondary | ICD-10-CM

## 2022-07-04 DIAGNOSIS — K219 Gastro-esophageal reflux disease without esophagitis: Secondary | ICD-10-CM

## 2022-07-04 DIAGNOSIS — G500 Trigeminal neuralgia: Secondary | ICD-10-CM

## 2022-07-04 MED ORDER — GABAPENTIN 300 MG PO CAPS: 300.0000 mg | ORAL_CAPSULE | Freq: Every day | ORAL | 0 refills | Status: DC

## 2022-07-04 MED ORDER — EZETIMIBE 10 MG PO TABS: 10.0000 mg | ORAL_TABLET | Freq: Every day | ORAL | 0 refills | Status: DC

## 2022-07-04 MED ORDER — FLUOXETINE HCL 20 MG PO CAPS: ORAL_CAPSULE | ORAL | 0 refills | Status: DC

## 2022-07-04 MED ORDER — OMEPRAZOLE 20 MG PO CPDR: 20.0000 mg | DELAYED_RELEASE_CAPSULE | Freq: Every day | ORAL | 0 refills | Status: DC

## 2022-07-04 NOTE — Telephone Encounter (Signed)
LOV  03/28/21  No future appt noted

## 2022-07-04 NOTE — Telephone Encounter (Signed)
Pt scheduled  

## 2022-07-05 ENCOUNTER — Ambulatory Visit (INDEPENDENT_AMBULATORY_CARE_PROVIDER_SITE_OTHER): Payer: PPO

## 2022-07-05 ENCOUNTER — Telehealth: Payer: PPO

## 2022-07-05 DIAGNOSIS — M549 Dorsalgia, unspecified: Secondary | ICD-10-CM

## 2022-07-05 DIAGNOSIS — M797 Fibromyalgia: Secondary | ICD-10-CM

## 2022-07-05 DIAGNOSIS — E785 Hyperlipidemia, unspecified: Secondary | ICD-10-CM

## 2022-07-05 DIAGNOSIS — M159 Polyosteoarthritis, unspecified: Secondary | ICD-10-CM

## 2022-07-05 DIAGNOSIS — I10 Essential (primary) hypertension: Secondary | ICD-10-CM

## 2022-07-05 DIAGNOSIS — F320 Major depressive disorder, single episode, mild: Secondary | ICD-10-CM

## 2022-07-05 NOTE — Patient Instructions (Signed)
Visit Information  Thank you for taking time to visit with me today. Please don't hesitate to contact me if I can be of assistance to you before our next scheduled telephone appointment.  Following are the goals we discussed today:  Current Barriers: 07-05-2022: The patient has met the goals of care and care plan being closed. The patient will see the pcp in November. She having some back pain and had recent skin cancer removal but is recovering.  Knowledge Deficits related to plan of care for management of HTN, HLD, Depression: depressed mood Grief and loss of a child, grief over not being able to work and having to retire early, and fibromyalgia and chronic pain   Chronic Disease Management support and education needs related to HTN, HLD, Depression: depressed mood grief, and fibromyalgia and chronic pain   RNCM Clinical Goal(s):  Patient will verbalize understanding of plan for management of HTN, HLD, Depression, and Fibromyalgia and chronic pain  verbalize basic understanding of HTN, HLD, Depression, and Chronic pain and fibromyalgia disease process and self health management plan  take all medications exactly as prescribed and will call provider for medication related questions demonstrate understanding of rationale for each prescribed medication  attend all scheduled medical appointments: sees the pcp 10-05-2022 demonstrate improved and ongoing health management independence  demonstrate a decrease in HTN, HLD, Depression, and chronic pain and fibromyalgia exacerbations  demonstrate ongoing self health care management ability by effective management of chronic conditions  through collaboration with RN Care manager, provider, and care team.    Interventions: 1:1 collaboration with primary care provider regarding development and update of comprehensive plan of care as evidenced by provider attestation and co-signature Inter-disciplinary care team collaboration (see longitudinal plan of  care) Evaluation of current treatment plan related to  self management and patient's adherence to plan as established by provider     SDOH Barriers (Status: Goal Met.)  Patient interviewed and SDOH assessment performed        SDOH Interventions     Flowsheet Row Most Recent Value  SDOH Interventions    Food Insecurity Interventions Intervention Not Indicated  Financial Strain Interventions Intervention Not Indicated  Housing Interventions Intervention Not Indicated  Intimate Partner Violence Interventions Intervention Not Indicated  Physical Activity Interventions Intervention Not Indicated, Other (Comments)  [Active in her garden and yard work and does a lot of baking]  Stress Interventions Intervention Not Indicated  Social Connections Interventions Intervention Not Indicated  Transportation Interventions Intervention Not Indicated       Patient interviewed and appropriate assessments performed Provided patient with information about resources available for working with the CCM team Discussed plans with patient for ongoing care management follow up and provided patient with direct contact information for care management team Advised patient to call the office for changes in SDOH, questions, or concerns       Depression  (Status: Goal Met.) Evaluation of current treatment plan related to Depression,  self-management and patient's adherence to plan as established by provider. 04-25-2022: The patient is doing better. She is happy that her MRI was good. She states that she has not heard from the results but was able to tell her to call Dr. Melrose Nakayama for finalized results but looked like a normal scan. She has not had any other facial pain since she has had the MRI. 07-05-2022: The patient is a little down today because she and her husband were going to [facility] on a trip but because she is having back pain  and discomfort. Her husband said they are going to put it off for now. The patient is  feeling better over the last several days. She is appreciative of the call and thankful for the support of the team.  Discussed plans with patient for ongoing care management follow up and provided patient with direct contact information for care management team Advised patient to call the office for changes in mood, anxiety, or depression; Provided education to patient re: CCM team available to assist in the management of her chronic conditions and help her maintain her health and well being ; Reviewed medications with patient and discussed compliance. 07-05-2022: The patient is compliant with her medications; Provided patient with grief and depression  educational materials related to depression and grief ; Reviewed scheduled/upcoming provider appointments including sees the pcp on 10-05-2022 at 0900 am. Reminder given today. ; Discussed plans with patient for ongoing care management follow up and provided patient with direct contact information for care management team; Advised patient to discuss about the fatty "lumps" she has all over her body with provider; Screening for signs and symptoms of depression related to chronic disease state;  Assessed social determinant of health barriers;    Hyperlipidemia:  (Status: Goal Met.)      Lab Results  Component Value Date    CHOL 196 03/28/2022    HDL 51 03/28/2022    LDLCALC 122 (H) 03/28/2022    TRIG 130 03/28/2022    CHOLHDL 3.8 03/28/2022      Medication review performed; medication list updated in electronic medical record. 07-05-2022: takes Zetia 10 mg daily Provider established cholesterol goals reviewed. 04-25-2022: The patient is at goal. The patient is doing well with management of her HLD. Counseled on importance of regular laboratory monitoring as prescribed. 07-05-2022: the patient has regular labwork; Provided HLD educational materials; Reviewed role and benefits of statin for ASCVD risk reduction; Discussed strategies to manage  statin-induced myalgias; Reviewed importance of limiting foods high in cholesterol. 07-05-2022: The patient is compliant with a heart healthy diet; Reviewed exercise goals and target of 150 minutes per week. 04-25-2022: The patient is walking daily. The pateint states she is working up to a goal of 1000 plus    Hypertension: (Status: Goal Met.) Last practice recorded BP readings:     BP Readings from Last 3 Encounters:  03/28/22 136/82  03/14/22 114/77  12/26/21 124/82  Most recent eGFR/CrCl:       Lab Results  Component Value Date    EGFR 62 03/28/2022    No components found for: CRCL   Evaluation of current treatment plan related to hypertension self management and patient's adherence to plan as established by provider. 07-05-2022: The patient has good control of her HTN and denies any issues with HTN or heart health.    Provided education to patient re: stroke prevention, s/s of heart attack and stroke; Reviewed prescribed diet heart healthy diet. 04-25-2022: compliant with heart healthy diet  Reviewed medications with patient and discussed importance of compliance. 04-25-2022: The patient is compliant with medications.  Discussed plans with patient for ongoing care management follow up and provided patient with direct contact information for care management team; Advised patient, providing education and rationale, to monitor blood pressure daily and record, calling PCP for findings outside established parameters;  Reviewed scheduled/upcoming provider appointments including: saw 03-28-2022- reminder given to call the specialist for follow up.  Provided education on prescribed diet heart healthy;  Discussed complications of poorly controlled blood pressure such   as heart disease, stroke, circulatory complications, vision complications, kidney impairment, sexual dysfunction;    Pain and fibromyalgia:  (Status: Goal Met.) 07-05-2022: The patient is having pain today at a 4/5 in her back. Was having  at about and 8 but it is getting better everyday. The patient also has had pain in her knee. Ask if she felt she needed a sooner appointment than November and she said no. She said she would call if things changed. Education and support given.  Pain assessment performed. 07-05-2022: Denies pain or discomfort today. States she has not had any additional pain since she had the MRI.  Medications reviewed. 07-05-2022: Is compliant with medications Reviewed provider established plan for pain management. 07-05-2022: Is compliant with the plan of care Discussed importance of adherence to all scheduled medical appointments; Counseled on the importance of reporting any/all new or changed pain symptoms or management strategies to pain management provider; Advised patient to report to care team affect of pain on daily activities; Discussed use of relaxation techniques and/or diversional activities to assist with pain reduction (distraction, imagery, relaxation, massage, acupressure, TENS, heat, and cold application; Reviewed with patient prescribed pharmacological and nonpharmacological pain relief strategies; Advised patient to discuss changes in level or intensity of pain, or unresolved pain with provider;   Patient Goals/Self-Care Activities: Patient will self administer medications as prescribed as evidenced by self report/primary caregiver report  Patient will attend all scheduled provider appointments as evidenced by clinician review of documented attendance to scheduled appointments and patient/caregiver report Patient will call pharmacy for medication refills as evidenced by patient report and review of pharmacy fill history as appropriate Patient will attend church or other social activities as evidenced by patient report Patient will continue to perform ADL's independently as evidenced by patient/caregiver report Patient will continue to perform IADL's independently as evidenced by patient/caregiver  report Patient will call provider office for new concerns or questions as evidenced by review of documented incoming telephone call notes and patient report Patient will work with BSW to address care coordination needs and will continue to work with the clinical team to address health care and disease management related needs as evidenced by documented adherence to scheduled care management/care coordination appointments - check blood pressure weekly - choose a place to take my blood pressure (home, clinic or office, retail store) - write blood pressure results in a log or diary - learn about high blood pressure - keep a blood pressure log - take blood pressure log to all doctor appointments - call doctor for signs and symptoms of high blood pressure - develop an action plan for high blood pressure - keep all doctor appointments - take medications for blood pressure exactly as prescribed - report new symptoms to your doctor - eat more whole grains, fruits and vegetables, lean meats and healthy fats - call for medicine refill 2 or 3 days before it runs out - take all medications exactly as prescribed - call doctor with any symptoms you believe are related to your medicine - call doctor when you experience any new symptoms - go to all doctor appointments as scheduled    Please call the care guide team at [phone] if you need to schedule an appointment.   If you are experiencing a Mental Health or [place] or need someone to talk to, please call the Suicide and Crisis Lifeline: 988 call the [place] National Suicide Prevention Lifeline: (226)151-2912 or TTY: 206-341-0870 TTY 607-739-5064) to talk to a trained counselor  call 1-800-273-TALK (toll free, 24 hour hotline)   The patient verbalized understanding of instructions, educational materials, and care plan provided today and DECLINED offer to receive copy of patient instructions, educational materials, and care plan.    Noreene Larsson RN, MSN, [facility] Mobile: [phone]

## 2022-07-05 NOTE — Chronic Care Management (AMB) (Signed)
Chronic Care Management   CCM RN Visit Note  07/05/2022 Name: Angela Martinez MRN: [id number] DOB: Jan 23, 1951  Subjective: Angela Martinez is a 71 y.o. year old female who is a primary care patient of Cannady, Barbaraann Faster, NP. The care management team was consulted for assistance with disease management and care coordination needs.    Engaged with patient by telephone for follow up visit in response to provider referral for case management and/or care coordination services.   Consent to Services:  The patient was given information about Chronic Care Management services, agreed to services, and gave verbal consent prior to initiation of services.  Please see initial visit note for detailed documentation.   Patient agreed to services and verbal consent obtained.   Assessment: Review of patient past medical history, allergies, medications, health status, including review of consultants reports, laboratory and other test data, was performed as part of comprehensive evaluation and provision of chronic care management services.   SDOH (Social Determinants of Health) assessments and interventions performed:    CCM Care Plan  Allergies  Allergen Reactions   Atorvastatin     Muscle cramps   Crestor [Rosuvastatin Calcium]     Muscle aches   Elemental Sulfur    Lyrica [Pregabalin] Other (See Comments)    Spaced out   Amoxicillin-Pot Clavulanate Nausea Only    Outpatient Encounter Medications as of 07/05/2022  Medication Sig Note   alendronate (FOSAMAX) 70 MG tablet Take 1 tablet (70 mg total) by mouth every 7 (seven) days. Take with a full glass of water on an empty stomach.    BIOTIN PO Take by mouth daily.    ezetimibe (ZETIA) 10 MG tablet Take 1 tablet (10 mg total) by mouth daily. NEEDS APPOINTMENT FOR FURTHER REFILLS    fexofenadine (ALLEGRA ALLERGY) 180 MG tablet Take 1 tablet (180 mg total) by mouth daily.    FLUoxetine (PROZAC) 20 MG capsule TAKE 1 CAPSULE total) by mouth daily. NEEDS  APPOINTMENT FOR FURTHER REFILLS    gabapentin (NEURONTIN) 300 MG capsule Take 1 capsule (300 mg total) by mouth at bedtime. NEEDS APPOINTMENT FOR FURTHER REFILLS    magnesium citrate SOLN Take 1 Bottle by mouth once.    MODERNA COVID-19 VACCINE 100 MCG/0.5ML injection     NAPROXEN PO Take by mouth as needed.    omeprazole (PRILOSEC) 20 MG capsule Take 1 capsule (20 mg total) by mouth daily. NEEDS APPOINTMENT FOR FURTHER REFILLS    valACYclovir (VALTREX) 500 MG tablet Take 1 tablet (500 mg total) by mouth as needed.    Vitamin D, Cholecalciferol, 400 UNITS TABS Take 1,000 Units by mouth.     [DISCONTINUED] fluticasone (FLONASE) 50 MCG/ACT nasal spray Place 2 sprays into both nostrils daily. 10/15/2019: As needed   No facility-administered encounter medications on file as of 07/05/2022.    Patient Active Problem List   Diagnosis Date Noted   Elevated TSH 03/29/2022   Upper respiratory tract infection 03/14/2022   Frequency of urination 12/26/2021   Back pain 12/26/2021   Trigeminal neuralgia 12/26/2021   Hypothyroidism 09/08/2021   Encounter for screening for malignant neoplasm of breast 06/22/2021   Diarrhea 06/22/2021   Myalgia due to statin 12/22/2020   Vitamin D deficiency 10/15/2019   Eczema 08/02/2018   Advanced care planning/counseling discussion 08/02/2017   Trigeminal neuralgia of left side of face 12/27/2015   Fibromyalgia    OA (osteoarthritis)    Osteoporosis    Hyperlipidemia    Essential hypertension  Obesity    GERD (gastroesophageal reflux disease)    Depression, major, single episode, mild (HCC)     Conditions to be addressed/monitored:HTN, HLD, Depression, and fibromyalgia and chronic pain   Care Plan : RNCM: General Plan of Care (Adult) for Chronic Disease Management and Care Coordination Needs  Updates made by Vanita Ingles, RN since 07/05/2022 12:00 AM  Completed 07/05/2022   Problem: RNCM: Development of Plan of Care for Chronic Disease Managment and  Care Coordination needs (HTN, HLD, Fibromyalgia, Hypothyroidism, Depression) Resolved 07/05/2022  Priority: High     Long-Range Goal: RNCM: Development of Plan of Care for Chronic Disease Managment and Care Coordination needs (HTN, HLD, Fibromyalgia, Hypothyroidism, Depression Completed 07/05/2022  Start Date: 09/09/2021  Expected End Date: 09/09/2022  Priority: High  Note:   Current Barriers: 07-05-2022: The patient has met the goals of care and care plan being closed. The patient will see the pcp in November. She having some back pain and had recent skin cancer removal but is recovering.  Knowledge Deficits related to plan of care for management of HTN, HLD, Depression: depressed mood Grief and loss of a child, grief over not being able to work and having to retire early, and fibromyalgia and chronic pain   Chronic Disease Management support and education needs related to HTN, HLD, Depression: depressed mood grief, and fibromyalgia and chronic pain  RNCM Clinical Goal(s):  Patient will verbalize understanding of plan for management of HTN, HLD, Depression, and Fibromyalgia and chronic pain  verbalize basic understanding of HTN, HLD, Depression, and Chronic pain and fibromyalgia disease process and self health management plan  take all medications exactly as prescribed and will call provider for medication related questions demonstrate understanding of rationale for each prescribed medication  attend all scheduled medical appointments: sees the pcp 10-05-2022 demonstrate improved and ongoing health management independence  demonstrate a decrease in HTN, HLD, Depression, and chronic pain and fibromyalgia exacerbations  demonstrate ongoing self health care management ability by effective management of chronic conditions  through collaboration with RN Care manager, provider, and care team.   Interventions: 1:1 collaboration with primary care provider regarding development and update of  comprehensive plan of care as evidenced by provider attestation and co-signature Inter-disciplinary care team collaboration (see longitudinal plan of care) Evaluation of current treatment plan related to  self management and patient's adherence to plan as established by provider   SDOH Barriers (Status: Goal Met.)  Patient interviewed and SDOH assessment performed        SDOH Interventions    Flowsheet Row Most Recent Value  SDOH Interventions   Food Insecurity Interventions Intervention Not Indicated  Financial Strain Interventions Intervention Not Indicated  Housing Interventions Intervention Not Indicated  Intimate Partner Violence Interventions Intervention Not Indicated  Physical Activity Interventions Intervention Not Indicated, Other (Comments)  [Active in her garden and yard work and does a lot of baking]  Stress Interventions Intervention Not Indicated  Social Connections Interventions Intervention Not Indicated  Transportation Interventions Intervention Not Indicated     Patient interviewed and appropriate assessments performed Provided patient with information about resources available for working with the CCM team Discussed plans with patient for ongoing care management follow up and provided patient with direct contact information for care management team Advised patient to call the office for changes in SDOH, questions, or concerns    Depression  (Status: Goal Met.) Evaluation of current treatment plan related to Depression,  self-management and patient's adherence to plan as established by provider.  04-25-2022: The patient is doing better. She is happy that her MRI was good. She states that she has not heard from the results but was able to tell her to call Dr. Melrose Nakayama for finalized results but looked like a normal scan. She has not had any other facial pain since she has had the MRI. 07-05-2022: The patient is a little down today because she and her husband were going to  [facility] on a trip but because she is having back pain and discomfort. Her husband said they are going to put it off for now. The patient is feeling better over the last several days. She is appreciative of the call and thankful for the support of the team.  Discussed plans with patient for ongoing care management follow up and provided patient with direct contact information for care management team Advised patient to call the office for changes in mood, anxiety, or depression; Provided education to patient re: CCM team available to assist in the management of her chronic conditions and help her maintain her health and well being ; Reviewed medications with patient and discussed compliance. 07-05-2022: The patient is compliant with her medications; Provided patient with grief and depression  educational materials related to depression and grief ; Reviewed scheduled/upcoming provider appointments including sees the pcp on 10-05-2022 at 0900 am. Reminder given today. ; Discussed plans with patient for ongoing care management follow up and provided patient with direct contact information for care management team; Advised patient to discuss about the fatty "lumps" she has all over her body with provider; Screening for signs and symptoms of depression related to chronic disease state;  Assessed social determinant of health barriers;   Hyperlipidemia:  (Status: Goal Met.) Lab Results  Component Value Date   CHOL 196 03/28/2022   HDL 51 03/28/2022   LDLCALC 122 (H) 03/28/2022   TRIG 130 03/28/2022   CHOLHDL 3.8 03/28/2022     Medication review performed; medication list updated in electronic medical record. 07-05-2022: takes Zetia 10 mg daily Provider established cholesterol goals reviewed. 04-25-2022: The patient is at goal. The patient is doing well with management of her HLD. Counseled on importance of regular laboratory monitoring as prescribed. 07-05-2022: the patient has regular labwork; Provided  HLD educational materials; Reviewed role and benefits of statin for ASCVD risk reduction; Discussed strategies to manage statin-induced myalgias; Reviewed importance of limiting foods high in cholesterol. 07-05-2022: The patient is compliant with a heart healthy diet; Reviewed exercise goals and target of 150 minutes per week. 04-25-2022: The patient is walking daily. The pateint states she is working up to a goal of 1000 plus   Hypertension: (Status: Goal Met.) Last practice recorded BP readings:  BP Readings from Last 3 Encounters:  03/28/22 136/82  03/14/22 114/77  12/26/21 124/82  Most recent eGFR/CrCl:  Lab Results  Component Value Date   EGFR 62 03/28/2022    No components found for: CRCL  Evaluation of current treatment plan related to hypertension self management and patient's adherence to plan as established by provider. 07-05-2022: The patient has good control of her HTN and denies any issues with HTN or heart health.    Provided education to patient re: stroke prevention, s/s of heart attack and stroke; Reviewed prescribed diet heart healthy diet. 04-25-2022: compliant with heart healthy diet  Reviewed medications with patient and discussed importance of compliance. 04-25-2022: The patient is compliant with medications.  Discussed plans with patient for ongoing care management follow up and provided patient with  direct contact information for care management team; Advised patient, providing education and rationale, to monitor blood pressure daily and record, calling PCP for findings outside established parameters;  Reviewed scheduled/upcoming provider appointments including: saw 03-28-2022- reminder given to call the specialist for follow up.  Provided education on prescribed diet heart healthy;  Discussed complications of poorly controlled blood pressure such as heart disease, stroke, circulatory complications, vision complications, kidney impairment, sexual dysfunction;   Pain and  fibromyalgia:  (Status: Goal Met.) 07-05-2022: The patient is having pain today at a 4/5 in her back. Was having at about and 8 but it is getting better everyday. The patient also has had pain in her knee. Ask if she felt she needed a sooner appointment than November and she said no. She said she would call if things changed. Education and support given.  Pain assessment performed. 07-05-2022: Denies pain or discomfort today. States she has not had any additional pain since she had the MRI.  Medications reviewed. 07-05-2022: Is compliant with medications Reviewed provider established plan for pain management. 07-05-2022: Is compliant with the plan of care Discussed importance of adherence to all scheduled medical appointments; Counseled on the importance of reporting any/all new or changed pain symptoms or management strategies to pain management provider; Advised patient to report to care team affect of pain on daily activities; Discussed use of relaxation techniques and/or diversional activities to assist with pain reduction (distraction, imagery, relaxation, massage, acupressure, TENS, heat, and cold application; Reviewed with patient prescribed pharmacological and nonpharmacological pain relief strategies; Advised patient to discuss changes in level or intensity of pain, or unresolved pain with provider;  Patient Goals/Self-Care Activities: Patient will self administer medications as prescribed as evidenced by self report/primary caregiver report  Patient will attend all scheduled provider appointments as evidenced by clinician review of documented attendance to scheduled appointments and patient/caregiver report Patient will call pharmacy for medication refills as evidenced by patient report and review of pharmacy fill history as appropriate Patient will attend church or other social activities as evidenced by patient report Patient will continue to perform ADL's independently as evidenced by  patient/caregiver report Patient will continue to perform IADL's independently as evidenced by patient/caregiver report Patient will call provider office for new concerns or questions as evidenced by review of documented incoming telephone call notes and patient report Patient will work with BSW to address care coordination needs and will continue to work with the clinical team to address health care and disease management related needs as evidenced by documented adherence to scheduled care management/care coordination appointments - check blood pressure weekly - choose a place to take my blood pressure (home, clinic or office, retail store) - write blood pressure results in a log or diary - learn about high blood pressure - keep a blood pressure log - take blood pressure log to all doctor appointments - call doctor for signs and symptoms of high blood pressure - develop an action plan for high blood pressure - keep all doctor appointments - take medications for blood pressure exactly as prescribed - report new symptoms to your doctor - eat more whole grains, fruits and vegetables, lean meats and healthy fats - call for medicine refill 2 or 3 days before it runs out - take all medications exactly as prescribed - call doctor with any symptoms you believe are related to your medicine - call doctor when you experience any new symptoms - go to all doctor appointments as scheduled - adhere to prescribed diet:  heart healthy       Plan:No further follow up required: the patient has met the goals of care. Knows to call the [facility] for changes or needs.   Noreene Larsson RN, MSN, [facility] Mobile: [phone]

## 2022-07-20 DIAGNOSIS — I10 Essential (primary) hypertension: Secondary | ICD-10-CM | POA: Diagnosis not present

## 2022-07-20 DIAGNOSIS — F320 Major depressive disorder, single episode, mild: Secondary | ICD-10-CM

## 2022-07-20 DIAGNOSIS — E785 Hyperlipidemia, unspecified: Secondary | ICD-10-CM | POA: Diagnosis not present

## 2022-07-28 ENCOUNTER — Encounter: Payer: Self-pay | Admitting: Nurse Practitioner

## 2022-08-17 ENCOUNTER — Other Ambulatory Visit: Payer: Self-pay | Admitting: Nurse Practitioner

## 2022-08-17 DIAGNOSIS — G500 Trigeminal neuralgia: Secondary | ICD-10-CM

## 2022-08-17 DIAGNOSIS — M797 Fibromyalgia: Secondary | ICD-10-CM

## 2022-08-17 DIAGNOSIS — M549 Dorsalgia, unspecified: Secondary | ICD-10-CM

## 2022-08-17 DIAGNOSIS — R350 Frequency of micturition: Secondary | ICD-10-CM

## 2022-08-17 NOTE — Telephone Encounter (Signed)
Requested Prescriptions  Pending Prescriptions Disp Refills  . gabapentin (NEURONTIN) 300 MG capsule [Pharmacy Med Name: GABAPENTIN 300 MG CAPSULE] 90 capsule 0    Sig: Take 1 capsule (300 mg total) by mouth at bedtime. NEEDS APPOINTMENT FOR FURTHER REFILLS     Neurology: Anticonvulsants - gabapentin Passed - 08/17/2022  1:39 PM      Passed - Cr in normal range and within 360 days    Creatinine, Ser  Date Value Ref Range Status  03/28/2022 0.97 0.57 - 1.00 mg/dL Final         Passed - Completed PHQ-2 or PHQ-9 in the last 360 days      Passed - Valid encounter within last 12 months    Recent Outpatient Visits          4 months ago Hyperlipidemia, unspecified hyperlipidemia type   [facility] [name], [name], MD   5 months ago Frequency of urination   [place] [name], [name], MD   7 months ago Frequency of urination   [place] [name], [name], MD   9 months ago Hyperlipidemia, unspecified hyperlipidemia type   [facility] [name], [name], MD   11 months ago Hyperlipidemia, unspecified hyperlipidemia type   [facility] [name], [name], MD      Future Appointments            In 1 month Cannady, Barbaraann Faster, NP [employer], [facility]

## 2022-09-12 ENCOUNTER — Other Ambulatory Visit: Payer: Self-pay | Admitting: Nurse Practitioner

## 2022-09-12 DIAGNOSIS — K219 Gastro-esophageal reflux disease without esophagitis: Secondary | ICD-10-CM

## 2022-09-13 ENCOUNTER — Telehealth: Payer: Self-pay | Admitting: Nurse Practitioner

## 2022-09-13 NOTE — Telephone Encounter (Signed)
Requested medication (s) are due for refill today: yes  Requested medication (s) are on the active medication list: yes  Last refill:  07/04/22 #60 0 refills  Future visit scheduled: yes in 1 week  Notes to clinic:  do you want to give #90 refill?     Requested Prescriptions  Pending Prescriptions Disp Refills   omeprazole (PRILOSEC) 20 MG capsule [Pharmacy Med Name: OMEPRAZOLE DR 20 MG CAPSULE] 60 capsule 0    Sig: Take 1 capsule (20 mg total) by mouth daily. NEEDS APPOINTMENT FOR FURTHER REFILLS     Gastroenterology: Proton Pump Inhibitors Passed - 09/12/2022  9:20 AM      Passed - Valid encounter within last 12 months    Recent Outpatient Visits           5 months ago Hyperlipidemia, unspecified hyperlipidemia type   [facility] [name], [name], MD   6 months ago Frequency of urination   [place] [name], [name], MD   8 months ago Frequency of urination   [place], [place]   10 months ago Hyperlipidemia, unspecified hyperlipidemia type   [facility] [name], [name], MD   1 year ago Hyperlipidemia, unspecified hyperlipidemia type   [place] [name], [name], MD       Future Appointments             In 1 week Cannady, Barbaraann Faster, NP [place], [facility]             FLUoxetine (PROZAC) 20 MG capsule [Pharmacy Med Name: FLUOXETINE HCL 20 MG CAPSULE] 60 capsule 0    Sig: TAKE 1 CAPSULE total) by mouth daily. NEEDS APPOINTMENT FOR FURTHER REFILLS     Psychiatry:  Antidepressants - SSRI Passed - 09/12/2022  9:20 AM      Passed - Completed PHQ-2 or PHQ-9 in the last 360 days      Passed - Valid encounter within last 6 months    Recent Outpatient Visits           5 months ago Hyperlipidemia, unspecified hyperlipidemia type   [facility] [name], [name], MD   6 months ago Frequency of urination   [place] [name], [name], MD   8 months ago Frequency of urination    [place], [place]   10 months ago Hyperlipidemia, unspecified hyperlipidemia type   [facility] [name], [name], MD   1 year ago Hyperlipidemia, unspecified hyperlipidemia type   [place] [name], [name], MD       Future Appointments             In 1 week Cannady, Barbaraann Faster, NP [employer], [facility]

## 2022-09-15 NOTE — Telephone Encounter (Signed)
Did not need.

## 2022-09-20 ENCOUNTER — Other Ambulatory Visit: Payer: Self-pay | Admitting: Nurse Practitioner

## 2022-09-20 DIAGNOSIS — M549 Dorsalgia, unspecified: Secondary | ICD-10-CM

## 2022-09-20 DIAGNOSIS — G500 Trigeminal neuralgia: Secondary | ICD-10-CM

## 2022-09-20 DIAGNOSIS — M797 Fibromyalgia: Secondary | ICD-10-CM

## 2022-09-20 DIAGNOSIS — R350 Frequency of micturition: Secondary | ICD-10-CM

## 2022-09-20 NOTE — Telephone Encounter (Signed)
Requested Prescriptions  Pending Prescriptions Disp Refills  . gabapentin (NEURONTIN) 300 MG capsule [Pharmacy Med Name: GABAPENTIN 300 MG CAPSULE] 90 capsule 0    Sig: Take 1 capsule (300 mg total) by mouth at bedtime. NEEDS APPOINTMENT FOR FURTHER REFILLS     Neurology: Anticonvulsants - gabapentin Passed - 09/20/2022  9:38 AM      Passed - Cr in normal range and within 360 days    Creatinine, Ser  Date Value Ref Range Status  03/28/2022 0.97 0.57 - 1.00 mg/dL Final         Passed - Completed PHQ-2 or PHQ-9 in the last 360 days      Passed - Valid encounter within last 12 months    Recent Outpatient Visits          5 months ago Hyperlipidemia, unspecified hyperlipidemia type   [facility] [name], [name], MD   6 months ago Frequency of urination   [place] [name], [name], MD   8 months ago Frequency of urination   [facility] [name], [name], MD   10 months ago Hyperlipidemia, unspecified hyperlipidemia type   [facility] [name], [name], MD   1 year ago Hyperlipidemia, unspecified hyperlipidemia type   [place] [name], [name], MD      Future Appointments            In 6 days Cannady, Barbaraann Faster, NP [employer], [facility]

## 2022-09-23 NOTE — Patient Instructions (Incomplete)

## 2022-09-25 ENCOUNTER — Ambulatory Visit: Payer: PPO | Admitting: Nurse Practitioner

## 2022-09-26 ENCOUNTER — Encounter: Payer: Self-pay | Admitting: Nurse Practitioner

## 2022-09-26 ENCOUNTER — Ambulatory Visit (INDEPENDENT_AMBULATORY_CARE_PROVIDER_SITE_OTHER): Payer: PPO | Admitting: Nurse Practitioner

## 2022-09-26 VITALS — BP 126/78 | HR 72 | Temp 98.0°F | Ht 65.0 in | Wt 174.8 lb

## 2022-09-26 DIAGNOSIS — K219 Gastro-esophageal reflux disease without esophagitis: Secondary | ICD-10-CM | POA: Diagnosis not present

## 2022-09-26 DIAGNOSIS — Z1231 Encounter for screening mammogram for malignant neoplasm of breast: Secondary | ICD-10-CM

## 2022-09-26 DIAGNOSIS — E6609 Other obesity due to excess calories: Secondary | ICD-10-CM

## 2022-09-26 DIAGNOSIS — E7800 Pure hypercholesterolemia, unspecified: Secondary | ICD-10-CM

## 2022-09-26 DIAGNOSIS — Z23 Encounter for immunization: Secondary | ICD-10-CM

## 2022-09-26 DIAGNOSIS — E039 Hypothyroidism, unspecified: Secondary | ICD-10-CM | POA: Diagnosis not present

## 2022-09-26 DIAGNOSIS — I10 Essential (primary) hypertension: Secondary | ICD-10-CM

## 2022-09-26 DIAGNOSIS — N898 Other specified noninflammatory disorders of vagina: Secondary | ICD-10-CM | POA: Insufficient documentation

## 2022-09-26 DIAGNOSIS — M791 Myalgia, unspecified site: Secondary | ICD-10-CM

## 2022-09-26 DIAGNOSIS — L2084 Intrinsic (allergic) eczema: Secondary | ICD-10-CM | POA: Diagnosis not present

## 2022-09-26 DIAGNOSIS — G500 Trigeminal neuralgia: Secondary | ICD-10-CM | POA: Diagnosis not present

## 2022-09-26 DIAGNOSIS — T466X5A Adverse effect of antihyperlipidemic and antiarteriosclerotic drugs, initial encounter: Secondary | ICD-10-CM | POA: Diagnosis not present

## 2022-09-26 DIAGNOSIS — E559 Vitamin D deficiency, unspecified: Secondary | ICD-10-CM | POA: Diagnosis not present

## 2022-09-26 DIAGNOSIS — M797 Fibromyalgia: Secondary | ICD-10-CM | POA: Diagnosis not present

## 2022-09-26 DIAGNOSIS — M810 Age-related osteoporosis without current pathological fracture: Secondary | ICD-10-CM | POA: Diagnosis not present

## 2022-09-26 DIAGNOSIS — F320 Major depressive disorder, single episode, mild: Secondary | ICD-10-CM | POA: Diagnosis not present

## 2022-09-26 DIAGNOSIS — Z6831 Body mass index (BMI) 31.0-31.9, adult: Secondary | ICD-10-CM

## 2022-09-26 MED ORDER — GABAPENTIN 300 MG PO CAPS: 300.0000 mg | ORAL_CAPSULE | Freq: Two times a day (BID) | ORAL | 4 refills | Status: DC

## 2022-09-26 MED ORDER — DESONIDE 0.05 % EX CREA: TOPICAL_CREAM | Freq: Two times a day (BID) | CUTANEOUS | 1 refills | Status: DC

## 2022-09-26 MED ORDER — ALENDRONATE SODIUM 70 MG PO TABS: 70.0000 mg | ORAL_TABLET | ORAL | 11 refills | Status: DC

## 2022-09-26 NOTE — Assessment & Plan Note (Signed)
Chronic, ongoing, tolerating Fosamax.  Continue this and plan for repeat DEXA with her mammo this year, provided her with [place] # to call and schedule.

## 2022-09-26 NOTE — Assessment & Plan Note (Signed)
Chronic, stable on current regimen with PHQ9 = 9, mainly related to pain with Zetia on board.  Denies SI/HI.  Continue current medication regimen and adjust as needed.  Return in 6 months, refills sent in.

## 2022-09-26 NOTE — Assessment & Plan Note (Signed)
BMI 29.09.  Recommended eating smaller high protein, low fat meals more frequently and exercising 30 mins a day 5 times a week with a goal of 10-15lb weight loss in the next 3 months. Patient voiced their understanding and motivation to adhere to these recommendations.

## 2022-09-26 NOTE — Assessment & Plan Note (Signed)
Chronic, stable.  Continue Prilosec and adjust dose as needed.  She reports benefit from this medication.  Check Mag level annually.

## 2022-09-26 NOTE — Assessment & Plan Note (Signed)
Chronic, poor tolerance to statins.  Had trial of Lipitor and Crestor with immediate muscle cramps.  Did not tolerate Zetia, currently with all over muscle pain with this -- recommend she stop taking.  Referral to lipid clinic for further evaluation.  Discussed at length with patient.  She agrees with this plan.

## 2022-09-26 NOTE — Progress Notes (Signed)
BP 126/78 (BP Location: Left Arm, Patient Position: Sitting, Cuff Size: Normal)   Pulse 72   Temp 98 F (36.7 C) (Oral)   Ht _0  (1.651 m)   Wt 174 lb 12.8 oz (79.3 kg)   SpO2 98%   BMI 29.09 kg/m    Subjective:    Patient ID: Angela Martinez, female    DOB: 1951/11/20, 71 y.o.   MRN: [id number]  HPI: Angela Martinez is a 72 y.o. female  Chief Complaint  Patient presents with   Depression   Hypertension   Hypothyroidism   Gastroesophageal Reflux   Medication Management    Patient says she is taking Gabapentin in the morning and at a bedtime because it helps with her pain she is currently experiencing. Patient says she is currently taking Zetia and says she is experiencing extremely painful cramps and says she is also taking Magnesium and it does not help and she would like to discuss different treatment options.    Ear Problem    Patient says she notices some itching in her R ear and says she noticed a spot there and she has been following up with Dermatologist. Patient would like to take a look at due to she has a cancer spot on her back.    Pain    Discuss pain in her whole body with provider at today's visit. Patient says she has heard popping in her ankle, shoulder and neck.    Vaginal Concerns    Patient says she is having a concern with her vaginal area.    Numbness    Patient says she has noticed some numbness in her hands.    Husband present at bedside with patient.    HYPERTENSION / HYPERLIPIDEMIA Currently she takes no blood pressure medications, is taking Zetia at this time, but it is causing severe muscle cramps.  Has had poor tolerance to Crestor and Lipitor, including on 1 and 3 day a week schedules.  Almost everyone in her family has high cholesterol.  Has fibromyalgia at baseline but Zetia has made her overall pain worse -- all over body, tension and pain.  Continues on Gabapentin 300 MG BID for fibromyalgia and trigeminal neuralgia left side of face -- last saw  neurology 03/06/22. Satisfied with current treatment? no Duration of hypertension: chronic BP monitoring frequency: rarely BP range: 120-130/80's BP medication side effects: no Duration of hyperlipidemia: chronic Cholesterol medication side effects: no Cholesterol supplements: none Medication compliance: good compliance Aspirin: no Recent stressors: no Recurrent headaches: no Visual changes: no Palpitations: no Dyspnea: only if walking longer distances at times Chest pain: no Lower extremity edema: no Dizzy/lightheaded: no  The 10-year ASCVD risk score (Arnett DK, et al., 2019) is: 12.7%   Values used to calculate the score:     Age: 60 years     Sex: Female     Is Non-Hispanic African American: No     Diabetic: No     Tobacco smoker: No     Systolic Blood Pressure: 063 mmHg     Is BP treated: No     HDL Cholesterol: 51 mg/dL     Total Cholesterol: 196 mg/dL  GERD Continue on Prilosec.  Reports this is well-controlled with this. GERD control status: controlled  Satisfied with current treatment? yes Heartburn frequency:  Medication side effects: no  Medication compliance: stable Dysphagia: no Odynophagia:  no Hematemesis: no Blood in stool: no EGD: no  HYPOTHYROIDISM Recent labs with elevation on  TSH noted == recent 5.650.  No current medications.  Has been elevated on labs since August 2022.  Has been sweating profusely at times and noticing numbness in hands/wrists + tremors for years.   Fatigue: yes Cold intolerance: no Heat intolerance: no Weight gain: no Weight loss: no Constipation: yes Diarrhea/loose stools: yes Palpitations: no Lower extremity edema: no Anxiety/depressed mood: yes   EAR PAIN To outside of right ear for long time, on and off. Duration: months Involved ear(s): right Fever: no Otorrhea: no Upper respiratory infection symptoms: no Pruritus: yes Hearing loss: no Water immersion no Using Q-tips: no Recurrent otitis media:  no Status: fluctuating Treatments attempted:  multiple over the counter creams    VAGINAL DISCOMFORT She reports air sensation coming from vagina at times.  Would like a pap at her physical next in February.   Duration: months Pruritus: no Dysuria: no Malodorous: no Urinary frequency: no Fevers: no Abdominal pain: no  Sexual activity: not sexually active  OSTEOPOROSIS: Takes Vitamin D every day.  Recent 05/25/20 -- the BMD measured at Femur Neck Left is 0.647 g/cm2 with a T-score of -2.8.  Satisfied with current treatment?: yes Medication side effects: no Medication compliance: good compliance Past osteoporosis medications/treatments:  Adequate calcium & vitamin D: yes Intolerance to bisphosphonates:no Weight bearing exercises: yes   DEPRESSION Continues on Prozac 20 MG daily. Mood status: stable Satisfied with current treatment?: yes Symptom severity: mild  Duration of current treatment : chronic Side effects: no Medication compliance: good compliance Psychotherapy/counseling: none Previous psychiatric medications: Prozac Depressed mood: no Anxious mood: no Anhedonia: no Significant weight loss or gain: no Insomnia: none Fatigue: no Feelings of worthlessness or guilt: no Impaired concentration/indecisiveness: no Suicidal ideations: no Hopelessness: no Crying spells: no    09/26/2022   10:15 AM 03/28/2022   10:48 AM 03/14/2022   11:09 AM 11/01/2021    9:16 AM 10/24/2021   10:46 AM  Depression screen PHQ 2/9  Decreased Interest _0 Down, Depressed, Hopeless 1 0 1 0 1  PHQ - 2 Score _1 Altered sleeping _2 Tired, decreased energy _3 Change in appetite _4 0  Feeling bad or failure about yourself  1 0 1 0 0  Trouble concentrating 0 0 1 0 0  Moving slowly or fidgety/restless 1 0 1 1 0  Suicidal thoughts 0 0 0 0 0  PHQ-9 Score _5 Difficult doing work/chores Not difficult at all Not difficult at all Not difficult at all Not  difficult at all Not difficult at all       09/26/2022   10:16 AM 03/28/2022   10:48 AM 03/14/2022   11:09 AM 11/01/2021    9:17 AM  GAD 7 : Generalized Anxiety Score  Nervous, Anxious, on Edge _6 0  Control/stop worrying 1 0 1 0  Worry too much - different things 1 0 1 1  Trouble relaxing 1 1 0 0  Restless 1 0 1 0  Easily annoyed or irritable _7 Afraid - awful might happen _8 0  Total GAD 7 Score _9 Anxiety Difficulty Not difficult at all Not difficult at all Not difficult at all Not difficult at all   Relevant past medical, surgical, family and social history reviewed and updated as indicated. Interim medical history since our  last visit reviewed. Allergies and medications reviewed and updated.  Review of Systems  Constitutional:  Negative for activity change, appetite change, diaphoresis, fatigue and fever.  Respiratory:  Negative for cough, chest tightness, shortness of breath and wheezing.   Cardiovascular:  Negative for chest pain, palpitations and leg swelling.  Gastrointestinal: Negative.   Endocrine: Negative for cold intolerance, heat intolerance, polydipsia, polyphagia and polyuria.  Musculoskeletal:  Positive for myalgias.  Neurological:  Positive for numbness. Negative for dizziness, syncope, weakness, light-headedness and headaches.  Psychiatric/Behavioral: Negative.      Per HPI unless specifically indicated above     Objective:    BP 126/78 (BP Location: Left Arm, Patient Position: Sitting, Cuff Size: Normal)   Pulse 72   Temp 98 F (36.7 C) (Oral)   Ht _0  (1.651 m)   Wt 174 lb 12.8 oz (79.3 kg)   SpO2 98%   BMI 29.09 kg/m   Wt Readings from Last 3 Encounters:  09/26/22 174 lb 12.8 oz (79.3 kg)  03/28/22 172 lb (78 kg)  03/14/22 171 lb (77.6 kg)    Physical Exam Vitals and nursing note reviewed.  Constitutional:      General: She is awake. She is not in acute distress.    Appearance: She is well-developed and well-groomed. She  is not ill-appearing or toxic-appearing.  HENT:     Head: Normocephalic and atraumatic.     Right Ear: Hearing, tympanic membrane and ear canal normal. No drainage.     Left Ear: Hearing, tympanic membrane, ear canal and external ear normal. No drainage.     Ears:     Comments: Mild eczema noted exterior right ear.    Nose: Nose normal.     Right Sinus: No maxillary sinus tenderness or frontal sinus tenderness.     Left Sinus: No maxillary sinus tenderness or frontal sinus tenderness.     Mouth/Throat:     Mouth: Mucous membranes are moist.     Pharynx: Oropharynx is clear. Uvula midline. No pharyngeal swelling, oropharyngeal exudate or posterior oropharyngeal erythema.  Eyes:     General: Lids are normal.        Right eye: No discharge.        Left eye: No discharge.     Extraocular Movements: Extraocular movements intact.     Conjunctiva/sclera: Conjunctivae normal.     Pupils: Pupils are equal, round, and reactive to light.     Visual Fields: Right eye visual fields normal and left eye visual fields normal.  Neck:     Thyroid: No thyromegaly.     Vascular: No carotid bruit.     Trachea: Trachea normal.  Cardiovascular:     Rate and Rhythm: Normal rate and regular rhythm.     Heart sounds: Normal heart sounds. No murmur heard.    No gallop.  Pulmonary:     Effort: Pulmonary effort is normal. No accessory muscle usage or respiratory distress.     Breath sounds: Normal breath sounds.  Chest:  Breasts:    Right: Normal.     Left: Normal.  Abdominal:     General: Bowel sounds are normal. There is no distension.     Palpations: Abdomen is soft.     Tenderness: There is no abdominal tenderness.  Musculoskeletal:        General: Normal range of motion.     Cervical back: Normal range of motion and neck supple.     Right lower leg: No edema.  Left lower leg: No edema.  Lymphadenopathy:     Head:     Right side of head: No submental, submandibular, tonsillar, preauricular  or posterior auricular adenopathy.     Left side of head: No submental, submandibular, tonsillar, preauricular or posterior auricular adenopathy.     Cervical: No cervical adenopathy.     Upper Body:     Right upper body: No supraclavicular, axillary or pectoral adenopathy.     Left upper body: No supraclavicular, axillary or pectoral adenopathy.  Skin:    General: Skin is warm and dry.     Capillary Refill: Capillary refill takes less than 2 seconds.     Findings: No rash.  Neurological:     Mental Status: She is alert and oriented to person, place, and time.     Gait: Gait is intact.     Deep Tendon Reflexes: Reflexes are normal and symmetric.     Reflex Scores:      Brachioradialis reflexes are 2+ on the right side and 2+ on the left side.      Patellar reflexes are 2+ on the right side and 2+ on the left side. Psychiatric:        Attention and Perception: Attention normal.        Mood and Affect: Mood normal.        Speech: Speech normal.        Behavior: Behavior normal. Behavior is cooperative.        Thought Content: Thought content normal.        Judgment: Judgment normal.    Results for orders placed or performed in visit on 03/28/22  Microscopic Examination   Urine  Result Value Ref Range   WBC, UA 0-5 0 - 5 /hpf   RBC, Urine None seen 0 - 2 /hpf   Epithelial Cells (non renal) 0-10 0 - 10 /hpf   Mucus, UA Present (A) Not Estab.   Bacteria, UA Few (A) None seen/Few  CBC with Differential/Platelet  Result Value Ref Range   WBC 6.0 3.4 - 10.8 x10E3/uL   RBC 5.24 3.77 - 5.28 x10E6/uL   Hemoglobin 13.1 11.1 - 15.9 g/dL   Hematocrit 42.8 34.0 - 46.6 %   MCV 82 79 - 97 fL   MCH 25.0 (L) 26.6 - 33.0 pg   MCHC 30.6 (L) 31.5 - 35.7 g/dL   RDW 16.1 (H) 11.7 - 15.4 %   Platelets 325 150 - 450 x10E3/uL   Neutrophils 53 Not Estab. %   Lymphs 36 Not Estab. %   Monocytes 7 Not Estab. %   Eos 3 Not Estab. %   Basos 1 Not Estab. %   Neutrophils Absolute 3.2 1.4 - 7.0  x10E3/uL   Lymphocytes Absolute 2.1 0.7 - 3.1 x10E3/uL   Monocytes Absolute 0.4 0.1 - 0.9 x10E3/uL   EOS (ABSOLUTE) 0.2 0.0 - 0.4 x10E3/uL   Basophils Absolute 0.0 0.0 - 0.2 x10E3/uL   Immature Granulocytes 0 Not Estab. %   Immature Grans (Abs) 0.0 0.0 - 0.1 x10E3/uL  Comprehensive metabolic panel  Result Value Ref Range   Glucose 88 70 - 99 mg/dL   BUN 16 8 - 27 mg/dL   Creatinine, Ser 0.97 0.57 - 1.00 mg/dL   eGFR 62 >59 mL/min/1.73   BUN/Creatinine Ratio 16 12 - 28   Sodium 142 134 - 144 mmol/L   Potassium 5.2 3.5 - 5.2 mmol/L   Chloride 106 96 - 106 mmol/L   CO2 23 20 -  29 mmol/L   Calcium 9.4 8.7 - 10.3 mg/dL   Total Protein 6.7 6.0 - 8.5 g/dL   Albumin 4.1 3.7 - 4.7 g/dL   Globulin, Total 2.6 1.5 - 4.5 g/dL   Albumin/Globulin Ratio 1.6 1.2 - 2.2   Bilirubin Total 0.3 0.0 - 1.2 mg/dL   Alkaline Phosphatase 52 44 - 121 IU/L   AST 19 0 - 40 IU/L   ALT 12 0 - 32 IU/L  Urinalysis, Routine w reflex microscopic  Result Value Ref Range   Specific Gravity, UA 1.015 1.005 - 1.030   pH, UA 6.5 5.0 - 7.5   Color, UA Yellow Yellow   Appearance Ur Clear Clear   Leukocytes,UA Trace (A) Negative   Protein,UA Negative Negative/Trace   Glucose, UA Negative Negative   Ketones, UA Negative Negative   RBC, UA Negative Negative   Bilirubin, UA Negative Negative   Urobilinogen, Ur 0.2 0.2 - 1.0 mg/dL   Nitrite, UA Negative Negative   Microscopic Examination See below:   TSH  Result Value Ref Range   TSH 5.650 (H) 0.450 - 4.500 uIU/mL  Lipid panel  Result Value Ref Range   Cholesterol, Total 196 100 - 199 mg/dL   Triglycerides 130 0 - 149 mg/dL   HDL 51 >39 mg/dL   VLDL Cholesterol Cal 23 5 - 40 mg/dL   LDL Chol Calc (NIH) 122 (H) 0 - 99 mg/dL   Chol/HDL Ratio 3.8 0.0 - 4.4 ratio      Assessment & Plan:   Problem List Items Addressed This Visit       Cardiovascular and Mediastinum   Essential hypertension (Chronic)    Chronic, stable without medications.  BP at goal in  office for age.  Continue current diet control and adjust plan as needed based on readings.  Continue to check BP at home regularly and document.  Initiate medication as needed.  Focus on DASH diet.  Labs today: CBC, CMP, TSH.  Return in 6 months.      Relevant Orders   CBC with Differential/Platelet   Comprehensive metabolic panel     Digestive   GERD (gastroesophageal reflux disease)    Chronic, stable.  Continue Prilosec and adjust dose as needed.  She reports benefit from this medication.  Check Mag level annually.      Relevant Orders   Magnesium     Endocrine   Hypothyroidism (Chronic)    Elevations noted on labs for several months, check TSH, Free T4, and thyroid antibody today -- discussed with patient if ongoing elevations then we will start medication as suspect some of her current symptoms are caused by thyroid issues.  Educated her on this and she agrees with plan.      Relevant Orders   TSH   T4, free   Thyroid peroxidase antibody     Nervous and Auditory   Trigeminal neuralgia of left side of face (Chronic)    Chronic, ongoing.  Followed by neurology, continue Gabapentin.  Recent note reviewed.      Relevant Medications   gabapentin (NEURONTIN) 300 MG capsule   Other Relevant Orders   CBC with Differential/Platelet   Magnesium     Musculoskeletal and Integument   Osteoporosis (Chronic)    Chronic, ongoing, tolerating Fosamax.  Continue this and plan for repeat DEXA with her mammo this year, provided her with [place] # to call and schedule.      Relevant Medications   alendronate (FOSAMAX) 70 MG tablet  Other Relevant Orders   VITAMIN D 25 Hydroxy (Vit-D Deficiency, Fractures)   DG Bone Density   Eczema    Noted to right ear, Desowen cream sent in to apply.  If no improvement alert provider.        Genitourinary   Dryness of vagina    Suspect her concerns are being caused by vaginal atrophy and dryness, recommend use of lubrication a few times a  week.  She will check with insurance if pap coverage and can plan on performing this next visit if so.        Other   Depression, major, single episode, mild (HCC) - Primary (Chronic)    Chronic, stable on current regimen with PHQ9 = 9, mainly related to pain with Zetia on board.  Denies SI/HI.  Continue current medication regimen and adjust as needed.  Return in 6 months, refills sent in.      Relevant Orders   CBC with Differential/Platelet   Fibromyalgia (Chronic)    Chronic, stable on Gabapentin.  Continue current medication and adjust as needed.  Return in 6 months.  Monitor kidney function and renal dose Gabapentin as needed.      Relevant Medications   gabapentin (NEURONTIN) 300 MG capsule   alendronate (FOSAMAX) 70 MG tablet   Other Relevant Orders   Magnesium   Hyperlipidemia (Chronic)    Chronic, poor tolerance to statins.  Had trial of Lipitor and Crestor with immediate muscle cramps.  Did not tolerate Zetia, currently with all over muscle pain with this -- recommend she stop taking.  Referral to lipid clinic for further evaluation.  Discussed at length with patient.  She agrees with this plan.      Relevant Orders   Comprehensive metabolic panel   Lipid Panel w/o Chol/HDL Ratio   AMB Referral to [facility]   Myalgia due to statin    Refer to hyperlipidemia plan of care.      Relevant Orders   Comprehensive metabolic panel   Lipid Panel w/o Chol/HDL Ratio   Obesity    BMI 29.09.  Recommended eating smaller high protein, low fat meals more frequently and exercising 30 mins a day 5 times a week with a goal of 10-15lb weight loss in the next 3 months. Patient voiced their understanding and motivation to adhere to these recommendations.       Vitamin D deficiency    Ongoing with osteoporosis.  Continue supplement at home and check Vit D level today.      Relevant Orders   VITAMIN D 25 Hydroxy (Vit-D Deficiency, Fractures)   Other Visit  Diagnoses     Encounter for screening mammogram for malignant neoplasm of breast       Mammogram ordered today.   Relevant Orders   MM 3D SCREEN BREAST BILATERAL        Follow up plan: Return in about 3 months (around 12/27/2022) for Annual physical with pap.

## 2022-09-26 NOTE — Assessment & Plan Note (Signed)
Chronic, ongoing.  Followed by neurology, continue Gabapentin.  Recent note reviewed.

## 2022-09-26 NOTE — Assessment & Plan Note (Signed)
Chronic, stable on Gabapentin.  Continue current medication and adjust as needed.  Return in 6 months.  Monitor kidney function and renal dose Gabapentin as needed.

## 2022-09-26 NOTE — Assessment & Plan Note (Signed)
Noted to right ear, Desowen cream sent in to apply.  If no improvement alert provider.

## 2022-09-26 NOTE — Assessment & Plan Note (Signed)
Refer to hyperlipidemia plan of care.

## 2022-09-26 NOTE — Assessment & Plan Note (Signed)
Chronic, stable without medications.  BP at goal in office for age.  Continue current diet control and adjust plan as needed based on readings.  Continue to check BP at home regularly and document.  Initiate medication as needed.  Focus on DASH diet.  Labs today: CBC, CMP, TSH.  Return in 6 months.

## 2022-09-26 NOTE — Assessment & Plan Note (Signed)
Elevations noted on labs for several months, check TSH, Free T4, and thyroid antibody today -- discussed with patient if ongoing elevations then we will start medication as suspect some of her current symptoms are caused by thyroid issues.  Educated her on this and she agrees with plan.

## 2022-09-26 NOTE — Assessment & Plan Note (Signed)
Suspect her concerns are being caused by vaginal atrophy and dryness, recommend use of lubrication a few times a week.  She will check with insurance if pap coverage and can plan on performing this next visit if so.

## 2022-09-26 NOTE — Assessment & Plan Note (Signed)
Ongoing with osteoporosis.  Continue supplement at home and check Vit D level today.

## 2022-09-27 LAB — CBC WITH DIFFERENTIAL/PLATELET
Basophils Absolute: 0 10*3/uL (ref 0.0–0.2)
Basos: 1 %
EOS (ABSOLUTE): 0.2 10*3/uL (ref 0.0–0.4)
Eos: 3 %
Hematocrit: 43.4 % (ref 34.0–46.6)
Hemoglobin: 13.6 g/dL (ref 11.1–15.9)
Immature Grans (Abs): 0 10*3/uL (ref 0.0–0.1)
Immature Granulocytes: 0 %
Lymphocytes Absolute: 2.2 10*3/uL (ref 0.7–3.1)
Lymphs: 36 %
MCH: 25.6 pg — ABNORMAL LOW (ref 26.6–33.0)
MCHC: 31.3 g/dL — ABNORMAL LOW (ref 31.5–35.7)
MCV: 82 fL (ref 79–97)
Monocytes Absolute: 0.4 10*3/uL (ref 0.1–0.9)
Monocytes: 7 %
Neutrophils Absolute: 3.3 10*3/uL (ref 1.4–7.0)
Neutrophils: 53 %
Platelets: 310 10*3/uL (ref 150–450)
RBC: 5.32 x10E6/uL — ABNORMAL HIGH (ref 3.77–5.28)
RDW: 15.6 % — ABNORMAL HIGH (ref 11.7–15.4)
WBC: 6.1 10*3/uL (ref 3.4–10.8)

## 2022-09-27 LAB — COMPREHENSIVE METABOLIC PANEL
ALT: 13 IU/L (ref 0–32)
AST: 19 IU/L (ref 0–40)
Albumin/Globulin Ratio: 1.7 (ref 1.2–2.2)
Albumin: 4.3 g/dL (ref 3.8–4.8)
Alkaline Phosphatase: 45 IU/L (ref 44–121)
BUN/Creatinine Ratio: 14 (ref 12–28)
BUN: 14 mg/dL (ref 8–27)
Bilirubin Total: 0.4 mg/dL (ref 0.0–1.2)
CO2: 24 mmol/L (ref 20–29)
Calcium: 9.5 mg/dL (ref 8.7–10.3)
Chloride: 105 mmol/L (ref 96–106)
Creatinine, Ser: 0.98 mg/dL (ref 0.57–1.00)
Globulin, Total: 2.6 g/dL (ref 1.5–4.5)
Glucose: 77 mg/dL (ref 70–99)
Potassium: 4.6 mmol/L (ref 3.5–5.2)
Sodium: 144 mmol/L (ref 134–144)
Total Protein: 6.9 g/dL (ref 6.0–8.5)
eGFR: 61 mL/min/{1.73_m2} (ref >59)

## 2022-09-27 LAB — T4, FREE: Free T4: 1.17 ng/dL (ref 0.82–1.77)

## 2022-09-27 LAB — VITAMIN D 25 HYDROXY (VIT D DEFICIENCY, FRACTURES): Vit D, 25-Hydroxy: 69.5 ng/mL (ref 30.0–100.0)

## 2022-09-27 LAB — MAGNESIUM: Magnesium: 2.2 mg/dL (ref 1.6–2.3)

## 2022-09-27 LAB — THYROID PEROXIDASE ANTIBODY: Thyroperoxidase Ab SerPl-aCnc: 11 IU/mL (ref 0–34)

## 2022-09-27 LAB — TSH: TSH: 3.93 u[IU]/mL (ref 0.450–4.500)

## 2022-09-27 LAB — LIPID PANEL W/O CHOL/HDL RATIO
Cholesterol, Total: 182 mg/dL (ref 100–199)
HDL: 51 mg/dL (ref >39)
LDL Chol Calc (NIH): 104 mg/dL — ABNORMAL HIGH (ref 0–99)
Triglycerides: 152 mg/dL — ABNORMAL HIGH (ref 0–149)
VLDL Cholesterol Cal: 27 mg/dL (ref 5–40)

## 2022-09-27 NOTE — Progress Notes (Signed)
Good morning, please let Angela Martinez know labs have returned: - CBC is remaining stable with no anemia noted or infection. - Cholesterol labs remain elevated, but mild improvement -- ensure to schedule with lipid clinic when they call as we discussed. - Kidney and liver function are stable. Electrolytes all normal. - Thyroid labs normal this check, suspect it was your Biotin supplement that may have been affecting levels -- Biotin can cause abnormal thyroid labs. - Vitamin D normal.  Overall stable labs.  Stop the Zetia as we discussed as feel it was causing a lot of your current symptoms and discomfort.  We will see what lipid clinic recommends for cholesterol lowering treatment.  Any questions? Keep being amazing!!  Thank you for allowing me to participate in your care.  I appreciate you. Kindest regards, Randale Carvalho

## 2022-09-28 ENCOUNTER — Telehealth: Payer: Self-pay

## 2022-09-28 NOTE — Telephone Encounter (Signed)
Called and LVM asking for patient to please return my call.   OK for PEC to give the patient Jolene's response if she calls back.

## 2022-09-28 NOTE — Telephone Encounter (Signed)
Pt returned our call. Shared provider's note  Irena Reichmann, [facility] 09/28/2022  9:42 AM EST Back to Top    Left message for patient to give our office a call back to discuss.   OK for PEC to give result note if patient calls back.   Venita Lick, NP 09/27/2022  7:31 PM EST     Good morning, please let Kemesha know labs have returned: - CBC is remaining stable with no anemia noted or infection. - Cholesterol labs remain elevated, but mild improvement -- ensure to schedule with lipid clinic when they call as we discussed. - Kidney and liver function are stable. Electrolytes all normal. - Thyroid labs normal this check, suspect it was your Biotin supplement that may have been affecting levels -- Biotin can cause abnormal thyroid labs. - Vitamin D normal.  Overall stable labs.  Stop the Zetia as we discussed as feel it was causing a lot of your current symptoms and discomfort.  We will see what lipid clinic recommends for cholesterol lowering treatment.  Any questions? Keep being amazing!!  Thank you for allowing me to participate in your care.  I appreciate you. Kindest regards, Jolene    Pt was taking the biotin for weak fingernails. Pt would like to know what she can take instead of the biotin for her fingernails.   Pt still needs ointment for her ears to be called in.   Please advise.

## 2022-09-28 NOTE — Telephone Encounter (Signed)
Patient notified of provider response- she states Rx was not at pharmacy- I called pharmacy and verified it is ready and may be picked up today. Patient  is also aware of her lab results.

## 2022-10-16 ENCOUNTER — Telehealth: Payer: Self-pay

## 2022-10-16 NOTE — Telephone Encounter (Signed)
Copied from [place] [id number]. Topic: General - Other >> Oct 10, 2022  9:46 AM Angela Martinez wrote: Caller states 12/26/2021 date of service was processed incorrectly on there end therefore the claim will be reprocessed. Patient would like a refund of her $25.

## 2022-10-23 NOTE — Telephone Encounter (Addendum)
Spoke with patient regarding $25 copay charge. Explained that the pt's insurance company must send additional payment before a refund can be considered. Patient acknowledged understanding.

## 2022-12-05 ENCOUNTER — Telehealth: Payer: Self-pay | Admitting: Nurse Practitioner

## 2022-12-05 NOTE — Telephone Encounter (Signed)
Copied from [place] [id number]. Topic: Medicare AWV >> Dec 05, 2022 11:14 AM Josephina Gip wrote: Reason for CRM: Left message for patient to call back and schedule the Medicare Annual Wellness Visit (AWV) virtually or by telephone.  Last AWV 10/24/21  Please schedule at anytime with [facility] Health Advisor.  30 minute appointment  Any questions, please call me at [phone]

## 2022-12-12 ENCOUNTER — Ambulatory Visit
Admission: RE | Admit: 2022-12-12 | Discharge: 2022-12-12 | Disposition: A | Discharge status: Home / Self Care | Payer: PPO | Source: Ambulatory Visit | Attending: Nurse Practitioner | Admitting: Nurse Practitioner

## 2022-12-12 DIAGNOSIS — Z1231 Encounter for screening mammogram for malignant neoplasm of breast: Secondary | ICD-10-CM | POA: Diagnosis not present

## 2022-12-12 DIAGNOSIS — M810 Age-related osteoporosis without current pathological fracture: Secondary | ICD-10-CM | POA: Diagnosis not present

## 2022-12-12 NOTE — Progress Notes (Signed)
Good morning, please let Angela Martinez know her bone density scan returned and continues to show osteoporosis, but levels are improving.  I recommend we continue on Fosamax at this time as it is offering benefit to her bone health.  Any questions?  Have a great day!!

## 2022-12-24 NOTE — Patient Instructions (Incomplete)
Eating Plan for Osteoporosis Osteoporosis causes your bones to become weak and brittle. This puts you at greater risk for bone breaks (fractures) from small bumps or falls. Making changes to your diet and increasing your physical activity can help strengthen your bones and improve your overall health. Calcium and vitamin D are nutrients that play an important role in bone health. Vitamin D helps your body use calcium and strengthen bones. It is important to get enough calcium and vitamin D as part of your eating plan for osteoporosis. What are tips for following this plan? Reading food labels Try to get at least 1,000 milligrams (mg) of calcium each day. Look for foods that have at least 50 mg of calcium per serving. Talk with your health care provider about taking a calcium supplement if you do not get enough calcium from food. Do not have more than 2,500 mg of calcium each day. This is the upper limit for food and nutritional supplements combined. Too much calcium may cause constipation and prevent you from absorbing other important nutrients. Choose foods that contain vitamin D. Take a daily vitamin supplement that contains 800-1,000 international units (IU) of vitamin D. The amount may be different depending on your age, body weight, and where you live. Talk with your dietitian or health care provider about how much vitamin D is right for you. Avoid foods that have more than 300 mg of sodium per serving. Too much sodium can cause your body to lose calcium. Talk with your dietitian or health care provider about how much sodium you are allowed each day. Shopping Do not buy foods with added salt, including: Salted snacks. Pickles. Canned soups. Canned meats. Processed meats, such as bacon or precooked or cured meat like sausages or meat loaves. Smoked fish. Meal planning Eat balanced meals that contain protein foods, fruits and vegetables, and foods rich in calcium and vitamin D. Eat at least  5 servings of fruits and vegetables each day. Eat 5-6 oz (142-170 g) of lean meat, poultry, fish, eggs, or beans each day. Lifestyle Do not use any products that contain nicotine or tobacco, such as cigarettes, e-cigarettes, and chewing tobacco. If you need help quitting, ask your health care provider. If your health care provider recommends that you lose weight: Work with a dietitian to develop an eating plan that will help you reach your desired weight goal. Exercise for at least 30 minutes a day, 5 or more days a week, or as told by your health care provider. Work with a physical therapist to develop an exercise plan that includes flexibility, balance, and strength exercises. Do not focus only on aerobic exercise. Do not drink alcohol if: Your health care provider tells you not to drink. You are pregnant, may be pregnant, or are planning to become pregnant. If you drink alcohol: Limit how much you use to: 0-1 drink a day for women. 0-2 drinks a day for men. Be aware of how much alcohol is in your drink. In the [place] one drink equals one 12 oz bottle of beer (355 mL), one 5 oz glass of wine (148 mL), or one 1 oz glass of hard liquor (44 mL). What foods should I eat? Foods high in calcium  Yogurt. Yogurt with fruit. Milk. Evaporated skim milk. Dry milk powder. Calcium-fortified orange juice. Parmesan cheese. Part-skim ricotta cheese. Natural hard cheese. Cream cheese. Cottage cheese. Canned sardines. Canned salmon. Calcium-treated tofu. Calcium-fortified cereal bar. Calcium-fortified cereal. Calcium-fortified graham crackers. Cooked collard greens. Turnip greens. Broccoli.   Kale. Almonds. White beans. Corn tortilla. Foods high in vitamin D Cod liver oil. Fatty fish, such as tuna, mackerel, and salmon. Milk. Fortified soy milk. Fortified fruit juice. Yogurt. Margarine. Egg yolks. Foods high in protein Beef. Lamb. Pork tenderloin. Chicken breast. Tuna (canned). Fish  fillet. Tofu. Cooked soy beans. Soy patty. Beans (canned or cooked). Cottage cheese. Yogurt. Peanut butter. Pumpkin seeds. Nuts. Sunflower seeds. Hard cheese. Milk or other milk products, such as soy milk. The items listed above may not be a complete list of foods and beverages you can eat. Contact a dietitian for more options. Summary Calcium and vitamin D are nutrients that play an important role in bone health and are an important part of your eating plan for osteoporosis. Eat balanced meals that contain protein foods, fruits and vegetables, and foods rich in calcium and vitamin D. Avoid foods that have more than 300 mg of sodium per serving. Too much sodium can cause your body to lose calcium. Exercise is an important part of prevention and treatment of osteoporosis. Aim for at least 30 minutes a day, 5 days a week. This information is not intended to replace advice given to you by your health care provider. Make sure you discuss any questions you have with your health care provider. Document Revised: 04/22/2020 Document Reviewed: 04/22/2020 [employer] Patient Education  2023 [employer]  

## 2022-12-25 DIAGNOSIS — D2272 Melanocytic nevi of left lower limb, including hip: Secondary | ICD-10-CM | POA: Diagnosis not present

## 2022-12-25 DIAGNOSIS — D2261 Melanocytic nevi of right upper limb, including shoulder: Secondary | ICD-10-CM | POA: Diagnosis not present

## 2022-12-25 DIAGNOSIS — Z85828 Personal history of other malignant neoplasm of skin: Secondary | ICD-10-CM | POA: Diagnosis not present

## 2022-12-25 DIAGNOSIS — D2262 Melanocytic nevi of left upper limb, including shoulder: Secondary | ICD-10-CM | POA: Diagnosis not present

## 2022-12-25 DIAGNOSIS — B351 Tinea unguium: Secondary | ICD-10-CM | POA: Diagnosis not present

## 2022-12-27 ENCOUNTER — Encounter: Payer: PPO | Admitting: Nurse Practitioner

## 2022-12-27 DIAGNOSIS — Z0000 Encounter for general adult medical examination without abnormal findings: Secondary | ICD-10-CM

## 2022-12-27 DIAGNOSIS — Z23 Encounter for immunization: Secondary | ICD-10-CM

## 2022-12-27 DIAGNOSIS — G500 Trigeminal neuralgia: Secondary | ICD-10-CM

## 2022-12-27 DIAGNOSIS — K219 Gastro-esophageal reflux disease without esophagitis: Secondary | ICD-10-CM

## 2022-12-27 DIAGNOSIS — E039 Hypothyroidism, unspecified: Secondary | ICD-10-CM

## 2022-12-27 DIAGNOSIS — E559 Vitamin D deficiency, unspecified: Secondary | ICD-10-CM

## 2022-12-27 DIAGNOSIS — I10 Essential (primary) hypertension: Secondary | ICD-10-CM

## 2022-12-27 DIAGNOSIS — T466X5A Adverse effect of antihyperlipidemic and antiarteriosclerotic drugs, initial encounter: Secondary | ICD-10-CM

## 2022-12-27 DIAGNOSIS — E6609 Other obesity due to excess calories: Secondary | ICD-10-CM

## 2022-12-27 DIAGNOSIS — E7800 Pure hypercholesterolemia, unspecified: Secondary | ICD-10-CM

## 2022-12-27 DIAGNOSIS — M797 Fibromyalgia: Secondary | ICD-10-CM

## 2022-12-27 DIAGNOSIS — F320 Major depressive disorder, single episode, mild: Secondary | ICD-10-CM

## 2022-12-27 DIAGNOSIS — M810 Age-related osteoporosis without current pathological fracture: Secondary | ICD-10-CM

## 2022-12-28 ENCOUNTER — Ambulatory Visit (INDEPENDENT_AMBULATORY_CARE_PROVIDER_SITE_OTHER): Payer: PPO | Admitting: Nurse Practitioner

## 2022-12-28 ENCOUNTER — Encounter: Payer: Self-pay | Admitting: Nurse Practitioner

## 2022-12-28 VITALS — BP 118/68 | HR 69 | Temp 98.5°F | Resp 18 | Wt 177.2 lb

## 2022-12-28 DIAGNOSIS — E039 Hypothyroidism, unspecified: Secondary | ICD-10-CM | POA: Diagnosis not present

## 2022-12-28 DIAGNOSIS — M797 Fibromyalgia: Secondary | ICD-10-CM | POA: Diagnosis not present

## 2022-12-28 DIAGNOSIS — G500 Trigeminal neuralgia: Secondary | ICD-10-CM

## 2022-12-28 DIAGNOSIS — I10 Essential (primary) hypertension: Secondary | ICD-10-CM | POA: Diagnosis not present

## 2022-12-28 DIAGNOSIS — E7800 Pure hypercholesterolemia, unspecified: Secondary | ICD-10-CM | POA: Diagnosis not present

## 2022-12-28 DIAGNOSIS — Z6831 Body mass index (BMI) 31.0-31.9, adult: Secondary | ICD-10-CM

## 2022-12-28 DIAGNOSIS — Z0000 Encounter for general adult medical examination without abnormal findings: Secondary | ICD-10-CM | POA: Diagnosis not present

## 2022-12-28 DIAGNOSIS — Z23 Encounter for immunization: Secondary | ICD-10-CM

## 2022-12-28 DIAGNOSIS — T466X5A Adverse effect of antihyperlipidemic and antiarteriosclerotic drugs, initial encounter: Secondary | ICD-10-CM | POA: Diagnosis not present

## 2022-12-28 DIAGNOSIS — M150 Primary generalized (osteo)arthritis: Secondary | ICD-10-CM

## 2022-12-28 DIAGNOSIS — F320 Major depressive disorder, single episode, mild: Secondary | ICD-10-CM | POA: Diagnosis not present

## 2022-12-28 DIAGNOSIS — M810 Age-related osteoporosis without current pathological fracture: Secondary | ICD-10-CM

## 2022-12-28 DIAGNOSIS — E6609 Other obesity due to excess calories: Secondary | ICD-10-CM

## 2022-12-28 DIAGNOSIS — M159 Polyosteoarthritis, unspecified: Secondary | ICD-10-CM

## 2022-12-28 DIAGNOSIS — E559 Vitamin D deficiency, unspecified: Secondary | ICD-10-CM | POA: Diagnosis not present

## 2022-12-28 DIAGNOSIS — K219 Gastro-esophageal reflux disease without esophagitis: Secondary | ICD-10-CM

## 2022-12-28 DIAGNOSIS — E66811 Obesity, class 1: Secondary | ICD-10-CM

## 2022-12-28 DIAGNOSIS — M791 Myalgia, unspecified site: Secondary | ICD-10-CM

## 2022-12-28 NOTE — Assessment & Plan Note (Signed)
Chronic, ongoing.  Referral to physical therapy and podiatry today -- due to OA discomfort right inner ankle and ongoing neck pain + left shoulder.  Continue Tylenol as needed.

## 2022-12-28 NOTE — Assessment & Plan Note (Addendum)
Chronic, ongoing, tolerating Fosamax.  Continue this and reviewed recent DEXA with her, noted some improvement in scores.  Repeat DEXA around 12/12/24.

## 2022-12-28 NOTE — Assessment & Plan Note (Addendum)
Chronic, stable.  Continue Prilosec and adjust dose as needed.  She reports benefit from this medication.  Check Mag level annually. Risks of PPI use were discussed with patient including bone loss, C. Diff diarrhea, pneumonia, infections, CKD, electrolyte abnormalities.  Verbalizes understanding and chooses to continue the medication.

## 2022-12-28 NOTE — Assessment & Plan Note (Signed)
BMI 29.49.  Recommended eating smaller high protein, low fat meals more frequently and exercising 30 mins a day 5 times a week with a goal of 10-15lb weight loss in the next 3 months. Patient voiced their understanding and motivation to adhere to these recommendations.

## 2022-12-28 NOTE — Assessment & Plan Note (Signed)
Refer to hyperlipidemia plan of care.

## 2022-12-28 NOTE — Assessment & Plan Note (Signed)
Chronic, stable on current regimen with PHQ9 = 7.  Denies SI/HI.  Continue current medication regimen and adjust as needed.  Return in 6 months, refills sent in.

## 2022-12-28 NOTE — Progress Notes (Signed)
BP 118/68 (BP Location: Right Arm, Patient Position: Sitting, Cuff Size: Normal)   Pulse 69   Temp 98.5 F (36.9 C) (Oral)   Resp 18   Wt 177 lb 3.2 oz (80.4 kg)   SpO2 96%   BMI 29.49 kg/m    Subjective:    Patient ID: Angela Martinez, female    DOB: [birth year], 72 y.o.   MRN: [id number]  HPI: Angela Martinez is a 72 y.o. female presenting on 12/28/2022 for comprehensive medical examination + Medicare Wellness and follow-up visit. Current medical complaints include:none  She currently lives with: Menopausal Symptoms: no  Husband present at bedside with patient.     HYPERTENSION / HYPERLIPIDEMIA Currently she takes no blood pressure medications.  Has had poor tolerance to Crestor and Lipitor, including on 1 and 3 day a week schedules + could not tolerate Zetia had severe cramps with this. Going to see lipid clinic on April 15th.   Has fibromyalgia at baseline but statins and Zetia made pain worse. Continues on Gabapentin 300 MG BID for fibromyalgia and trigeminal neuralgia left side of face -- last saw neurology 03/06/22.  Has not had pain in face for some time. Satisfied with current treatment? no Duration of hypertension: chronic BP monitoring frequency: rarely BP range: 120-130/80's BP medication side effects: no Duration of hyperlipidemia: chronic Cholesterol medication side effects: no Cholesterol supplements: none Medication compliance: good compliance Aspirin: no Recent stressors: no Recurrent headaches: no Visual changes: no Palpitations: no Dyspnea: no Chest pain: no Lower extremity edema: no Dizzy/lightheaded: no  The 10-year ASCVD risk score (Arnett DK, et al., 2019) is: 11.1%   Values used to calculate the score:     Age: 23 years     Sex: Female     Is Non-Hispanic African American: No     Diabetic: No     Tobacco smoker: No     Systolic Blood Pressure: 742 mmHg     Is BP treated: No     HDL Cholesterol: 51 mg/dL     Total Cholesterol: 182 mg/dL     GERD Continue on Prilosec.  If does not take this causes return of symptoms. GERD control status: controlled  Satisfied with current treatment? yes Heartburn frequency:  Medication side effects: no  Medication compliance: stable Dysphagia: no Odynophagia:  no Hematemesis: no Blood in stool: no EGD: no   HYPOTHYROIDISM History of elevation past labs.  No current medications.    Fatigue: yes Cold intolerance: no Heat intolerance: no Weight gain: no Weight loss: no Constipation: yes Diarrhea/loose stools: yes Palpitations: no Lower extremity edema: no Anxiety/depressed mood: yes   OSTEOPOROSIS: Takes Vitamin D every day.  DEXA 05/25/20 -- T-score of -2.8. Recent DEXA on 12/12/22 noted T-Score -2.5.  Taking Alendronate weekly. Was going to go walking but has area in ankle right side, inner aspect, that is protruding and uncomfortable.  Having ongoing left shoulder pain due to arthritis in her neck and is interested in physical therapy for this. Satisfied with current treatment?: yes Medication side effects: no Medication compliance: good compliance Past osteoporosis medications/treatments:  Adequate calcium & vitamin D: yes Intolerance to bisphosphonates:no Weight bearing exercises: yes    DEPRESSION Continues on Prozac 20 MG daily. Mood status: stable Satisfied with current treatment?: yes Symptom severity: mild  Duration of current treatment : chronic Side effects: no Medication compliance: good compliance Psychotherapy/counseling: none Previous psychiatric medications: Prozac Depressed mood: no Anxious mood: no Anhedonia: no Significant weight loss or gain:  no Insomnia: none Fatigue: no Feelings of worthlessness or guilt: no Impaired concentration/indecisiveness: no Suicidal ideations: no Hopelessness: no Crying spells: no    12/28/2022    8:45 AM 09/26/2022   10:15 AM 03/28/2022   10:48 AM 03/14/2022   11:09 AM 11/01/2021    9:16 AM  Depression screen PHQ 2/9   Decreased Interest '1 1 1 1 1  '$ Down, Depressed, Hopeless 1 1 0 1 0  PHQ - 2 Score '2 2 1 2 1  '$ Altered sleeping '1 2 1 1 1  '$ Tired, decreased energy '1 1 1 1 1  '$ Change in appetite '2 2 1 1 1  '$ Feeling bad or failure about yourself  1 1 0 1 0  Trouble concentrating 0 0 0 1 0  Moving slowly or fidgety/restless 0 1 0 1 1  Suicidal thoughts 0 0 0 0 0  PHQ-9 Score '7 9 4 8 5  '$ Difficult doing work/chores Not difficult at all Not difficult at all Not difficult at all Not difficult at all Not difficult at all       12/28/2022    8:45 AM 09/26/2022   10:16 AM 03/28/2022   10:48 AM 03/14/2022   11:09 AM  GAD 7 : Generalized Anxiety Score  Nervous, Anxious, on Edge '1 1 1 1  '$ Control/stop worrying 0 1 0 1  Worry too much - different things 0 1 0 1  Trouble relaxing '1 1 1 '$ 0  Restless 1 1 0 1  Easily annoyed or irritable '1 1 1 1  '$ Afraid - awful might happen 0 '1 1 1  '$ Total GAD 7 Score '4 7 4 6  '$ Anxiety Difficulty Not difficult at all Not difficult at all Not difficult at all Not difficult at all      11/01/2021    9:16 AM 03/14/2022   11:09 AM 03/28/2022   10:48 AM 09/26/2022   10:15 AM 12/28/2022    8:43 AM  Fall Risk  Falls in the past year? 0 1 1 0 0  Was there an injury with Fall? 0 1 1 0 0  Fall Risk Category Calculator 0 3 3 0 0  Fall Risk Category (Retired) Low High High Low   (RETIRED) Patient Fall Risk Level  Low fall risk Low fall risk Low fall risk   Patient at Risk for Falls Due to No Fall Risks Medication side effect History of fall(s) No Fall Risks No Fall Risks  Fall risk Follow up Falls evaluation completed Falls evaluation completed Falls evaluation completed Falls evaluation completed Falls evaluation completed    Functional Status Survey: Is the patient deaf or have difficulty hearing?: No Does the patient have difficulty seeing, even when wearing glasses/contacts?: No Does the patient have difficulty concentrating, remembering, or making decisions?: No Does the patient have  difficulty walking or climbing stairs?: No Does the patient have difficulty dressing or bathing?: No Does the patient have difficulty doing errands alone such as visiting a doctor's office or shopping?: No    Past Medical History:  Past Medical History:  Diagnosis Date   Anxiety    Complication of anesthesia    WITH WRIST SURGERY HARD TO GET RELAXED. OK WITH KNEE / BACK   Cough    Depression    Fibromyalgia    GERD (gastroesophageal reflux disease)    Herpes simplex    1 AND 2   Hyperlipidemia    OA (osteoarthritis)    Osteopenia    Overweight  Plantar fasciitis    Shortness of breath dyspnea    DOE   Tremors of nervous system     Surgical History:  Past Surgical History:  Procedure Laterality Date   BACK SURGERY     BUNIONECTOMY     CATARACT EXTRACTION W/PHACO Right 03/16/2016   Procedure: CATARACT EXTRACTION PHACO AND INTRAOCULAR LENS PLACEMENT (IOC);  Surgeon: Leandrew Koyanagi, MD;  Location: [facility] ORS;  Service: Ophthalmology;  Laterality: Right;  [place] 52.4 AP% 8.8 CDE 4.63 Fluid Pack Lot # [id number]   CATARACT EXTRACTION W/PHACO Left 05/18/2016   Procedure: CATARACT EXTRACTION PHACO AND INTRAOCULAR LENS PLACEMENT (IOC);  Surgeon: Leandrew Koyanagi, MD;  Location: [facility] ORS;  Service: Ophthalmology;  Laterality: Left;  [place] 47.3 AP% 10.4 CDE 4.90 Fluid pack lot # [id number]   DILATION AND CURETTAGE OF UTERUS     x 3   JOINT REPLACEMENT     LUMBAR FUSION     L4-L5 with cage   TOTAL KNEE ARTHROPLASTY Left 2007   WRIST SURGERY      Medications:  Current Outpatient Medications on File Prior to Visit  Medication Sig   alendronate (FOSAMAX) 70 MG tablet Take 1 tablet (70 mg total) by mouth every 7 (seven) days. Take with a full glass of water on an empty stomach.   b complex vitamins capsule Take 1 capsule by mouth daily.   desonide (DESOWEN) 0.05 % cream Apply topically 2 (two) times daily. Apply for 5 days and then stop, may repeat as needed.   FLUoxetine (PROZAC)  20 MG capsule TAKE 1 CAPSULE total) by mouth daily.   gabapentin (NEURONTIN) 300 MG capsule Take 1 capsule (300 mg total) by mouth 2 (two) times daily.   magnesium citrate SOLN Take 1 Bottle by mouth once.   NAPROXEN PO Take by mouth as needed.   omeprazole (PRILOSEC) 20 MG capsule Take 1 capsule (20 mg total) by mouth daily.   valACYclovir (VALTREX) 500 MG tablet Take 1 tablet (500 mg total) by mouth as needed.   Vitamin D, Cholecalciferol, 400 UNITS TABS Take 1,000 Units by mouth.    vitamin E 180 MG (400 UNITS) capsule Take 400 Units by mouth daily.   [DISCONTINUED] fluticasone (FLONASE) 50 MCG/ACT nasal spray Place 2 sprays into both nostrils daily.   No current facility-administered medications on file prior to visit.    Allergies:  Allergies  Allergen Reactions   Zetia [Ezetimibe]     Muscle cramps severe   Atorvastatin     Muscle cramps   Crestor [Rosuvastatin Calcium]     Muscle aches   Elemental Sulfur    Lyrica [Pregabalin] Other (See Comments)    Spaced out   Amoxicillin-Pot Clavulanate Nausea Only    Social History:  Social History   Socioeconomic History   Marital status: Married    Spouse name: Not on file   Number of children: Not on file   Years of education: Not on file   Highest education level: High school graduate  Occupational History   Not on file  Tobacco Use   Smoking status: Never   Smokeless tobacco: Never  Vaping Use   Vaping Use: Never used  Substance and Sexual Activity   Alcohol use: No   Drug use: No   Sexual activity: Not on file  Other Topics Concern   Not on file  Social History Narrative   Not on file   Social Determinants of Health   Financial Resource Strain: Low Risk  (12/28/2022)  Overall Financial Resource Strain (CARDIA)    Difficulty of Paying Living Expenses: Not hard at all  Food Insecurity: No Food Insecurity (12/28/2022)   Hunger Vital Sign    Worried About Running Out of Food in the Last Year: Never true    Ran  Out of Food in the Last Year: Never true  Transportation Needs: No Transportation Needs (12/28/2022)   PRAPARE - Hydrologist (Medical): No    Lack of Transportation (Non-Medical): No  Physical Activity: Insufficiently Active (12/28/2022)   Exercise Vital Sign    Days of Exercise per Week: 2 days    Minutes of Exercise per Session: 20 min  Stress: No Stress Concern Present (12/28/2022)   [place]    Feeling of Stress : Not at all  Social Connections: Moderately Integrated (12/28/2022)   Social Connection and Isolation Panel [NHANES]    Frequency of Communication with Friends and Family: More than three times a week    Frequency of Social Gatherings with Friends and Family: More than three times a week    Attends Religious Services: More than 4 times per year    Active Member of [employer] or Organizations: No    Attends Archivist Meetings: Never    Marital Status: Married  Human resources officer Violence: Not At Risk (12/28/2022)   Humiliation, Afraid, Rape, and Kick questionnaire    Fear of Current or Ex-Partner: No    Emotionally Abused: No    Physically Abused: No    Sexually Abused: No   Social History   Tobacco Use  Smoking Status Never  Smokeless Tobacco Never   Social History   Substance and Sexual Activity  Alcohol Use No    Family History:  Family History  Problem Relation Age of Onset   Alcohol abuse Father    Alcohol abuse Sister    Arthritis Sister        5 sisters, some with RA   Seizures Sister    Cancer Sister        breast   Cirrhosis Sister    Diabetes Brother    Breast cancer Neg Hx     Past medical history, surgical history, medications, allergies, family history and social history reviewed with patient today and changes made to appropriate areas of the chart.   ROS All other ROS negative except what is listed above and in the HPI.      Objective:     BP 118/68 (BP Location: Right Arm, Patient Position: Sitting, Cuff Size: Normal)   Pulse 69   Temp 98.5 F (36.9 C) (Oral)   Resp 18   Wt 177 lb 3.2 oz (80.4 kg)   SpO2 96%   BMI 29.49 kg/m   Wt Readings from Last 3 Encounters:  12/28/22 177 lb 3.2 oz (80.4 kg)  09/26/22 174 lb 12.8 oz (79.3 kg)  03/28/22 172 lb (78 kg)    Physical Exam Vitals and nursing note reviewed. Exam conducted with a chaperone present.  Constitutional:      General: She is awake. She is not in acute distress.    Appearance: She is well-developed and well-groomed. She is not ill-appearing or toxic-appearing.  HENT:     Head: Normocephalic and atraumatic.     Right Ear: Hearing, tympanic membrane, ear canal and external ear normal. No drainage.     Left Ear: Hearing, tympanic membrane, ear canal and external ear normal. No drainage.  Nose: Nose normal.     Right Sinus: No maxillary sinus tenderness or frontal sinus tenderness.     Left Sinus: No maxillary sinus tenderness or frontal sinus tenderness.     Mouth/Throat:     Mouth: Mucous membranes are moist.     Pharynx: Oropharynx is clear. Uvula midline. No pharyngeal swelling, oropharyngeal exudate or posterior oropharyngeal erythema.  Eyes:     General: Lids are normal.        Right eye: No discharge.        Left eye: No discharge.     Extraocular Movements: Extraocular movements intact.     Conjunctiva/sclera: Conjunctivae normal.     Pupils: Pupils are equal, round, and reactive to light.     Visual Fields: Right eye visual fields normal and left eye visual fields normal.  Neck:     Thyroid: No thyromegaly.     Vascular: No carotid bruit.     Trachea: Trachea normal.  Cardiovascular:     Rate and Rhythm: Normal rate and regular rhythm.     Heart sounds: Normal heart sounds. No murmur heard.    No gallop.  Pulmonary:     Effort: Pulmonary effort is normal. No accessory muscle usage or respiratory distress.     Breath sounds: Normal breath  sounds.  Chest:  Breasts:    Right: Normal.     Left: Normal.  Abdominal:     General: Bowel sounds are normal.     Palpations: Abdomen is soft. There is no hepatomegaly or splenomegaly.     Tenderness: There is no abdominal tenderness.  Musculoskeletal:        General: Normal range of motion.     Cervical back: Normal range of motion and neck supple.     Right lower leg: No edema.     Left lower leg: No edema.  Lymphadenopathy:     Head:     Right side of head: No submental, submandibular, tonsillar, preauricular or posterior auricular adenopathy.     Left side of head: No submental, submandibular, tonsillar, preauricular or posterior auricular adenopathy.     Cervical: No cervical adenopathy.     Upper Body:     Right upper body: No supraclavicular, axillary or pectoral adenopathy.     Left upper body: No supraclavicular, axillary or pectoral adenopathy.  Skin:    General: Skin is warm and dry.     Capillary Refill: Capillary refill takes less than 2 seconds.     Findings: No rash.  Neurological:     Mental Status: She is alert and oriented to person, place, and time.     Gait: Gait is intact.     Deep Tendon Reflexes: Reflexes are normal and symmetric.     Reflex Scores:      Brachioradialis reflexes are 2+ on the right side and 2+ on the left side.      Patellar reflexes are 2+ on the right side and 2+ on the left side. Psychiatric:        Attention and Perception: Attention normal.        Mood and Affect: Mood normal.        Speech: Speech normal.        Behavior: Behavior normal. Behavior is cooperative.        Thought Content: Thought content normal.        Judgment: Judgment normal.       12/28/2022    9:04 AM 10/24/2021   10:32  AM 07/26/2018   10:53 AM 07/05/2017    8:46 AM  6CIT Screen  What Year? 0 points 0 points 0 points 0 points  What month? 0 points 0 points 0 points 0 points  What time? 0 points 0 points 0 points 0 points  Count back from 20 0 points 0  points 0 points 0 points  Months in reverse 0 points 0 points 0 points 0 points  Repeat phrase 0 points  2 points 2 points  Total Score 0 points  2 points 2 points   Results for orders placed or performed in visit on 09/26/22  CBC with Differential/Platelet  Result Value Ref Range   WBC 6.1 3.4 - 10.8 x10E3/uL   RBC 5.32 (H) 3.77 - 5.28 x10E6/uL   Hemoglobin 13.6 11.1 - 15.9 g/dL   Hematocrit 43.4 34.0 - 46.6 %   MCV 82 79 - 97 fL   MCH 25.6 (L) 26.6 - 33.0 pg   MCHC 31.3 (L) 31.5 - 35.7 g/dL   RDW 15.6 (H) 11.7 - 15.4 %   Platelets 310 150 - 450 x10E3/uL   Neutrophils 53 Not Estab. %   Lymphs 36 Not Estab. %   Monocytes 7 Not Estab. %   Eos 3 Not Estab. %   Basos 1 Not Estab. %   Neutrophils Absolute 3.3 1.4 - 7.0 x10E3/uL   Lymphocytes Absolute 2.2 0.7 - 3.1 x10E3/uL   Monocytes Absolute 0.4 0.1 - 0.9 x10E3/uL   EOS (ABSOLUTE) 0.2 0.0 - 0.4 x10E3/uL   Basophils Absolute 0.0 0.0 - 0.2 x10E3/uL   Immature Granulocytes 0 Not Estab. %   Immature Grans (Abs) 0.0 0.0 - 0.1 x10E3/uL  Comprehensive metabolic panel  Result Value Ref Range   Glucose 77 70 - 99 mg/dL   BUN 14 8 - 27 mg/dL   Creatinine, Ser 0.98 0.57 - 1.00 mg/dL   eGFR 61 >59 mL/min/1.73   BUN/Creatinine Ratio 14 12 - 28   Sodium 144 134 - 144 mmol/L   Potassium 4.6 3.5 - 5.2 mmol/L   Chloride 105 96 - 106 mmol/L   CO2 24 20 - 29 mmol/L   Calcium 9.5 8.7 - 10.3 mg/dL   Total Protein 6.9 6.0 - 8.5 g/dL   Albumin 4.3 3.8 - 4.8 g/dL   Globulin, Total 2.6 1.5 - 4.5 g/dL   Albumin/Globulin Ratio 1.7 1.2 - 2.2   Bilirubin Total 0.4 0.0 - 1.2 mg/dL   Alkaline Phosphatase 45 44 - 121 IU/L   AST 19 0 - 40 IU/L   ALT 13 0 - 32 IU/L  Lipid Panel w/o Chol/HDL Ratio  Result Value Ref Range   Cholesterol, Total 182 100 - 199 mg/dL   Triglycerides 152 (H) 0 - 149 mg/dL   HDL 51 >39 mg/dL   VLDL Cholesterol Cal 27 5 - 40 mg/dL   LDL Chol Calc (NIH) 104 (H) 0 - 99 mg/dL  Magnesium  Result Value Ref Range   Magnesium  2.2 1.6 - 2.3 mg/dL  TSH  Result Value Ref Range   TSH 3.930 0.450 - 4.500 uIU/mL  VITAMIN D 25 Hydroxy (Vit-D Deficiency, Fractures)  Result Value Ref Range   Vit D, 25-Hydroxy 69.5 30.0 - 100.0 ng/mL  T4, free  Result Value Ref Range   Free T4 1.17 0.82 - 1.77 ng/dL  Thyroid peroxidase antibody  Result Value Ref Range   Thyroperoxidase Ab SerPl-aCnc 11 0 - 34 IU/mL      Assessment & Plan:  Problem List Items Addressed This Visit       Cardiovascular and Mediastinum   Essential hypertension (Chronic)    Chronic, stable. Currently no medications.  BP at goal in office for age.  Continue current diet control and adjust plan as needed based on readings.  Continue to check BP at home regularly and document.  Initiate medication as needed.  Focus on DASH diet.  Labs today: CBC, CMP, TSH, urine ALB.  Return in 6 months.      Relevant Orders   Urine Microalbumin w/creat. ratio   CBC with Differential/Platelet     Digestive   GERD (gastroesophageal reflux disease)    Chronic, stable.  Continue Prilosec and adjust dose as needed.  She reports benefit from this medication.  Check Mag level annually. Risks of PPI use were discussed with patient including bone loss, C. Diff diarrhea, pneumonia, infections, CKD, electrolyte abnormalities.  Verbalizes understanding and chooses to continue the medication.       Relevant Orders   Magnesium     Endocrine   Hypothyroidism (Chronic)    Elevations noted on past labs with recent stable, check TSH and Free T4 -- discussed with patient if ongoing elevations then we will start medication.  Educated her on this and she agrees with plan.      Relevant Orders   TSH   T4, free     Nervous and Auditory   Trigeminal neuralgia of left side of face (Chronic)    Chronic, ongoing.  Followed by neurology, continue Gabapentin.  Recent note reviewed.        Musculoskeletal and Integument   Osteoporosis (Chronic)    Chronic, ongoing, tolerating  Fosamax.  Continue this and reviewed recent DEXA with her, noted some improvement in scores.  Repeat DEXA around 12/12/24.      Relevant Medications   vitamin E 180 MG (400 UNITS) capsule   Other Relevant Orders   VITAMIN D 25 Hydroxy (Vit-D Deficiency, Fractures)   OA (osteoarthritis)    Chronic, ongoing.  Referral to physical therapy and podiatry today -- due to OA discomfort right inner ankle and ongoing neck pain + left shoulder.  Continue Tylenol as needed.      Relevant Orders   Ambulatory referral to Physical Therapy   Ambulatory referral to Podiatry     Other   Depression, major, single episode, mild (HCC) (Chronic)    Chronic, stable on current regimen with PHQ9 = 7.  Denies SI/HI.  Continue current medication regimen and adjust as needed.  Return in 6 months, refills sent in.      Fibromyalgia (Chronic)    Chronic, stable on Gabapentin.  Continue current medication and adjust as needed.  Return in 6 months.  Monitor kidney function and renal dose Gabapentin as needed.      Relevant Orders   Ambulatory referral to Physical Therapy   Hyperlipidemia (Chronic)    Chronic, poor tolerance to statins.  Had trial of Lipitor, Zetia, and Crestor with immediate muscle cramps.  Did not tolerate Zetia, currently with all over muscle pain with this -- recommend she stop taking.  Referral to lipid clinic placed last visit and is scheduled.  Discussed at length with patient.  She agrees with this plan.      Relevant Orders   CBC with Differential/Platelet   Comprehensive metabolic panel   Lipid Panel w/o Chol/HDL Ratio   Myalgia due to statin    Refer to hyperlipidemia plan of care.  Obesity    BMI 29.49.  Recommended eating smaller high protein, low fat meals more frequently and exercising 30 mins a day 5 times a week with a goal of 10-15lb weight loss in the next 3 months. Patient voiced their understanding and motivation to adhere to these recommendations.       Vitamin D  deficiency   Relevant Orders   VITAMIN D 25 Hydroxy (Vit-D Deficiency, Fractures)   Other Visit Diagnoses     Medicare annual wellness visit, subsequent    -  Primary   Medicare wellness due and performed with patient today.   Pneumococcal vaccination given       PSV20 provided today.   Relevant Orders   Pneumococcal conjugate vaccine 20-valent (Prevnar 20) (Completed)   Encounter for annual physical exam       Annual physical today with labs and health maintenance reviewed.        Follow up plan: Return in about 6 months (around 06/28/2023) for HTN/HLD, OSTEOPOROSIS, MOOD.   LABORATORY TESTING:  - Pap smear: not applicable  IMMUNIZATIONS:   - Tdap: Tetanus vaccination status reviewed: refuses today, will obtain next visit. - Influenza: Up to date - Pneumovax: Up to date - Prevnar: Administered today - COVID: Up to date - HPV: Not applicable - Shingrix vaccine: Refused  SCREENING: -Mammogram: Up to date  - Colonoscopy: Refused  - Bone Density: Up to date  -Hearing Test: Not applicable  -Spirometry: Not applicable   PATIENT COUNSELING:   Advised to take 1 mg of folate supplement per day if capable of pregnancy.   Sexuality: Discussed sexually transmitted diseases, partner selection, use of condoms, avoidance of unintended pregnancy  and contraceptive alternatives.   Advised to avoid cigarette smoking.  I discussed with the patient that most people either abstain from alcohol or drink within safe limits (<=14/week and <=4 drinks/occasion for males, <=7/weeks and <= 3 drinks/occasion for females) and that the risk for alcohol disorders and other health effects rises proportionally with the number of drinks per week and how often a drinker exceeds daily limits.  Discussed cessation/primary prevention of drug use and availability of treatment for abuse.   Diet: Encouraged to adjust caloric intake to maintain  or achieve ideal body weight, to reduce intake of dietary  saturated fat and total fat, to limit sodium intake by avoiding high sodium foods and not adding table salt, and to maintain adequate dietary potassium and calcium preferably from fresh fruits, vegetables, and low-fat dairy products.    Stressed the importance of regular exercise  Injury prevention: Discussed safety belts, safety helmets, smoke detector, smoking near bedding or upholstery.   Dental health: Discussed importance of regular tooth brushing, flossing, and dental visits.    NEXT PREVENTATIVE PHYSICAL DUE IN 1 YEAR. Return in about 6 months (around 06/28/2023) for HTN/HLD, OSTEOPOROSIS, MOOD.

## 2022-12-28 NOTE — Assessment & Plan Note (Addendum)
Chronic, stable. Currently no medications.  BP at goal in office for age.  Continue current diet control and adjust plan as needed based on readings.  Continue to check BP at home regularly and document.  Initiate medication as needed.  Focus on DASH diet.  Labs today: CBC, CMP, TSH, urine ALB.  Return in 6 months.

## 2022-12-28 NOTE — Assessment & Plan Note (Signed)
Chronic, ongoing.  Followed by neurology, continue Gabapentin.  Recent note reviewed.

## 2022-12-28 NOTE — Assessment & Plan Note (Signed)
Chronic, stable on Gabapentin.  Continue current medication and adjust as needed.  Return in 6 months.  Monitor kidney function and renal dose Gabapentin as needed.

## 2022-12-28 NOTE — Assessment & Plan Note (Signed)
Elevations noted on past labs with recent stable, check TSH and Free T4 -- discussed with patient if ongoing elevations then we will start medication.  Educated her on this and she agrees with plan.

## 2022-12-28 NOTE — Patient Instructions (Signed)
Eating Plan for Osteoporosis Osteoporosis causes your bones to become weak and brittle. This puts you at greater risk for bone breaks (fractures) from small bumps or falls. Making changes to your diet and increasing your physical activity can help strengthen your bones and improve your overall health. Calcium and vitamin D are nutrients that play an important role in bone health. Vitamin D helps your body use calcium and strengthen bones. It is important to get enough calcium and vitamin D as part of your eating plan for osteoporosis. What are tips for following this plan? Reading food labels Try to get at least 1,000 milligrams (mg) of calcium each day. Look for foods that have at least 50 mg of calcium per serving. Talk with your health care provider about taking a calcium supplement if you do not get enough calcium from food. Do not have more than 2,500 mg of calcium each day. This is the upper limit for food and nutritional supplements combined. Too much calcium may cause constipation and prevent you from absorbing other important nutrients. Choose foods that contain vitamin D. Take a daily vitamin supplement that contains 800-1,000 international units (IU) of vitamin D. The amount may be different depending on your age, body weight, and where you live. Talk with your dietitian or health care provider about how much vitamin D is right for you. Avoid foods that have more than 300 mg of sodium per serving. Too much sodium can cause your body to lose calcium. Talk with your dietitian or health care provider about how much sodium you are allowed each day. Shopping Do not buy foods with added salt, including: Salted snacks. Pickles. Canned soups. Canned meats. Processed meats, such as bacon or precooked or cured meat like sausages or meat loaves. Smoked fish. Meal planning Eat balanced meals that contain protein foods, fruits and vegetables, and foods rich in calcium and vitamin D. Eat at least  5 servings of fruits and vegetables each day. Eat 5-6 oz (142-170 g) of lean meat, poultry, fish, eggs, or beans each day. Lifestyle Do not use any products that contain nicotine or tobacco, such as cigarettes, e-cigarettes, and chewing tobacco. If you need help quitting, ask your health care provider. If your health care provider recommends that you lose weight: Work with a dietitian to develop an eating plan that will help you reach your desired weight goal. Exercise for at least 30 minutes a day, 5 or more days a week, or as told by your health care provider. Work with a physical therapist to develop an exercise plan that includes flexibility, balance, and strength exercises. Do not focus only on aerobic exercise. Do not drink alcohol if: Your health care provider tells you not to drink. You are pregnant, may be pregnant, or are planning to become pregnant. If you drink alcohol: Limit how much you use to: 0-1 drink a day for women. 0-2 drinks a day for men. Be aware of how much alcohol is in your drink. In the [place] one drink equals one 12 oz bottle of beer (355 mL), one 5 oz glass of wine (148 mL), or one 1 oz glass of hard liquor (44 mL). What foods should I eat? Foods high in calcium  Yogurt. Yogurt with fruit. Milk. Evaporated skim milk. Dry milk powder. Calcium-fortified orange juice. Parmesan cheese. Part-skim ricotta cheese. Natural hard cheese. Cream cheese. Cottage cheese. Canned sardines. Canned salmon. Calcium-treated tofu. Calcium-fortified cereal bar. Calcium-fortified cereal. Calcium-fortified graham crackers. Cooked collard greens. Turnip greens. Broccoli.   Kale. Almonds. White beans. Corn tortilla. Foods high in vitamin D Cod liver oil. Fatty fish, such as tuna, mackerel, and salmon. Milk. Fortified soy milk. Fortified fruit juice. Yogurt. Margarine. Egg yolks. Foods high in protein Beef. Lamb. Pork tenderloin. Chicken breast. Tuna (canned). Fish  fillet. Tofu. Cooked soy beans. Soy patty. Beans (canned or cooked). Cottage cheese. Yogurt. Peanut butter. Pumpkin seeds. Nuts. Sunflower seeds. Hard cheese. Milk or other milk products, such as soy milk. The items listed above may not be a complete list of foods and beverages you can eat. Contact a dietitian for more options. Summary Calcium and vitamin D are nutrients that play an important role in bone health and are an important part of your eating plan for osteoporosis. Eat balanced meals that contain protein foods, fruits and vegetables, and foods rich in calcium and vitamin D. Avoid foods that have more than 300 mg of sodium per serving. Too much sodium can cause your body to lose calcium. Exercise is an important part of prevention and treatment of osteoporosis. Aim for at least 30 minutes a day, 5 days a week. This information is not intended to replace advice given to you by your health care provider. Make sure you discuss any questions you have with your health care provider. Document Revised: 04/22/2020 Document Reviewed: 04/22/2020 Elsevier Patient Education  2023 Elsevier Inc.  

## 2022-12-28 NOTE — Assessment & Plan Note (Signed)
Chronic, poor tolerance to statins.  Had trial of Lipitor, Zetia, and Crestor with immediate muscle cramps.  Did not tolerate Zetia, currently with all over muscle pain with this -- recommend Angela Martinez stop taking.  Referral to lipid clinic placed last visit and is scheduled.  Discussed at length with patient.  Angela Martinez agrees with this plan.

## 2022-12-29 ENCOUNTER — Other Ambulatory Visit: Payer: Self-pay | Admitting: Nurse Practitioner

## 2022-12-29 LAB — CBC WITH DIFFERENTIAL/PLATELET
Basophils Absolute: 0.1 10*3/uL (ref 0.0–0.2)
Basos: 1 %
EOS (ABSOLUTE): 0.2 10*3/uL (ref 0.0–0.4)
Eos: 3 %
Hematocrit: 43.1 % (ref 34.0–46.6)
Hemoglobin: 13.4 g/dL (ref 11.1–15.9)
Immature Grans (Abs): 0 10*3/uL (ref 0.0–0.1)
Immature Granulocytes: 0 %
Lymphocytes Absolute: 2 10*3/uL (ref 0.7–3.1)
Lymphs: 35 %
MCH: 25 pg — ABNORMAL LOW (ref 26.6–33.0)
MCHC: 31.1 g/dL — ABNORMAL LOW (ref 31.5–35.7)
MCV: 80 fL (ref 79–97)
Monocytes Absolute: 0.4 10*3/uL (ref 0.1–0.9)
Monocytes: 7 %
Neutrophils Absolute: 3.1 10*3/uL (ref 1.4–7.0)
Neutrophils: 54 %
Platelets: 318 10*3/uL (ref 150–450)
RBC: 5.37 x10E6/uL — ABNORMAL HIGH (ref 3.77–5.28)
RDW: 15.6 % — ABNORMAL HIGH (ref 11.7–15.4)
WBC: 5.7 10*3/uL (ref 3.4–10.8)

## 2022-12-29 LAB — COMPREHENSIVE METABOLIC PANEL
ALT: 12 IU/L (ref 0–32)
AST: 15 IU/L (ref 0–40)
Albumin/Globulin Ratio: 1.5 (ref 1.2–2.2)
Albumin: 4 g/dL (ref 3.8–4.8)
Alkaline Phosphatase: 46 IU/L (ref 44–121)
BUN/Creatinine Ratio: 18 (ref 12–28)
BUN: 15 mg/dL (ref 8–27)
Bilirubin Total: 0.2 mg/dL (ref 0.0–1.2)
CO2: 26 mmol/L (ref 20–29)
Calcium: 9.4 mg/dL (ref 8.7–10.3)
Chloride: 105 mmol/L (ref 96–106)
Creatinine, Ser: 0.85 mg/dL (ref 0.57–1.00)
Globulin, Total: 2.7 g/dL (ref 1.5–4.5)
Glucose: 82 mg/dL (ref 70–99)
Potassium: 4.4 mmol/L (ref 3.5–5.2)
Sodium: 143 mmol/L (ref 134–144)
Total Protein: 6.7 g/dL (ref 6.0–8.5)
eGFR: 72 mL/min/{1.73_m2} (ref >59)

## 2022-12-29 LAB — TSH: TSH: 4.69 u[IU]/mL — ABNORMAL HIGH (ref 0.450–4.500)

## 2022-12-29 LAB — LIPID PANEL W/O CHOL/HDL RATIO
Cholesterol, Total: 222 mg/dL — ABNORMAL HIGH (ref 100–199)
HDL: 46 mg/dL (ref >39)
LDL Chol Calc (NIH): 151 mg/dL — ABNORMAL HIGH (ref 0–99)
Triglycerides: 137 mg/dL (ref 0–149)
VLDL Cholesterol Cal: 25 mg/dL (ref 5–40)

## 2022-12-29 LAB — MAGNESIUM: Magnesium: 2.2 mg/dL (ref 1.6–2.3)

## 2022-12-29 LAB — T4, FREE: Free T4: 1.08 ng/dL (ref 0.82–1.77)

## 2022-12-29 LAB — VITAMIN D 25 HYDROXY (VIT D DEFICIENCY, FRACTURES): Vit D, 25-Hydroxy: 68.4 ng/mL (ref 30.0–100.0)

## 2022-12-29 MED ORDER — LEVOTHYROXINE SODIUM 25 MCG PO TABS: 25.0000 ug | ORAL_TABLET | Freq: Every day | ORAL | 3 refills | Status: DC

## 2022-12-29 NOTE — Progress Notes (Signed)
Good afternoon, please let Adyn know her labs have returned: - Cholesterol labs remain elevated, maintain visit with lipid clinic in April. - Thyroid labs show mild elevation again in TSH and normal Free T4, with her symptoms I do feel she may benefit from a trial of a low dose of thyroid medication (Levothyroxine) to see if benefit -- since she does have recurrent elevations in her TSH it appears it may be more sluggish. I will send this in and would like her to return in 6 weeks for thyroid visit with me (please schedule). - Remainder of labs are all normal.  Any questions? Keep being amazing!!  Thank you for allowing me to participate in your care.  I appreciate you. Kindest regards, Eilam Shrewsbury

## 2022-12-30 LAB — MICROALBUMIN / CREATININE URINE RATIO
Creatinine, Urine: 56.8 mg/dL
Microalb/Creat Ratio: <5 mg/g creat (ref 0–29)
Microalbumin, Urine: <3 ug/mL

## 2022-12-31 NOTE — Progress Notes (Signed)
Good afternoon, please let Angela Martinez know her labs have returned: - Cholesterol labs remain elevated, maintain visit with lipid clinic in April. - Thyroid labs show mild elevation again in TSH and normal Free T4, with her symptoms I do feel she may benefit from a trial of a low dose of thyroid medication (Levothyroxine) to see if benefit -- since she does have recurrent elevations in her TSH it appears it may be more sluggish. I will send this in and would like her to return in 6 weeks for thyroid visit with me (please schedule). - Remainder of labs are all normal.  Any questions? Keep being amazing!!  Thank you for allowing me to participate in your care.  I appreciate you. Kindest regards, Kensleigh Gates

## 2023-01-01 ENCOUNTER — Telehealth: Payer: Self-pay | Admitting: *Deleted

## 2023-01-01 DIAGNOSIS — M797 Fibromyalgia: Secondary | ICD-10-CM | POA: Diagnosis not present

## 2023-01-01 DIAGNOSIS — M159 Polyosteoarthritis, unspecified: Secondary | ICD-10-CM | POA: Diagnosis not present

## 2023-01-01 NOTE — Telephone Encounter (Signed)
FYI: Also - she had first therapy session- very positive.

## 2023-01-01 NOTE — Telephone Encounter (Signed)
Labs printed and mailed to the patient as requested.

## 2023-01-01 NOTE — Telephone Encounter (Signed)
Patient returned call and notified: Good afternoon, please let Angela Martinez know her labs have returned: - Cholesterol labs remain elevated, maintain visit with lipid clinic in April. - Thyroid labs show mild elevation again in TSH and normal Free T4, with her symptoms I do feel she may benefit from a trial of a low dose of thyroid medication (Levothyroxine) to see if benefit -- since she does have recurrent elevations in her TSH it appears it may be more sluggish. I will send this in and would like her to return in 6 weeks for thyroid visit with me (please schedule). - Remainder of labs are all normal.  Any questions? Keep being amazing!!  Thank you for allowing me to participate in your care.  I appreciate you. Kindest regards, Jolene  Patient has been scheduled- she did request a copy of her labs be mailed to her.

## 2023-01-03 DIAGNOSIS — M159 Polyosteoarthritis, unspecified: Secondary | ICD-10-CM | POA: Diagnosis not present

## 2023-01-03 DIAGNOSIS — M797 Fibromyalgia: Secondary | ICD-10-CM | POA: Diagnosis not present

## 2023-01-04 ENCOUNTER — Telehealth: Payer: Self-pay

## 2023-01-04 NOTE — Telephone Encounter (Signed)
Paperwork faxed back to [facility]

## 2023-01-09 DIAGNOSIS — M159 Polyosteoarthritis, unspecified: Secondary | ICD-10-CM | POA: Diagnosis not present

## 2023-01-09 DIAGNOSIS — M797 Fibromyalgia: Secondary | ICD-10-CM | POA: Diagnosis not present

## 2023-01-12 DIAGNOSIS — M159 Polyosteoarthritis, unspecified: Secondary | ICD-10-CM | POA: Diagnosis not present

## 2023-01-12 DIAGNOSIS — M797 Fibromyalgia: Secondary | ICD-10-CM | POA: Diagnosis not present

## 2023-01-17 ENCOUNTER — Telehealth: Payer: Self-pay | Admitting: Nurse Practitioner

## 2023-01-17 NOTE — Telephone Encounter (Signed)
Copied from [place] [id number]. Topic: Medicare AWV >> Jan 17, 2023  1:53 PM Devoria Glassing wrote: Reason for CRM: Called patient to schedule Medicare Annual Wellness Visit (AWV). Left message for patient to call back and schedule Medicare Annual Wellness Visit (AWV).  Last date of AWV: 07/26/2018  Please schedule an appointment at any time with Kirke Shaggy, NHA  .  If any questions, please contact me.  Thank you ,  Sherol Dade; [place] Direct Dial: [phone]

## 2023-01-18 ENCOUNTER — Ambulatory Visit (INDEPENDENT_AMBULATORY_CARE_PROVIDER_SITE_OTHER): Payer: PPO | Admitting: Physician Assistant

## 2023-01-18 VITALS — BP 126/72 | Ht 65.0 in | Wt 177.0 lb

## 2023-01-18 DIAGNOSIS — R350 Frequency of micturition: Secondary | ICD-10-CM | POA: Diagnosis not present

## 2023-01-18 DIAGNOSIS — M159 Polyosteoarthritis, unspecified: Secondary | ICD-10-CM | POA: Diagnosis not present

## 2023-01-18 DIAGNOSIS — R8281 Pyuria: Secondary | ICD-10-CM | POA: Diagnosis not present

## 2023-01-18 DIAGNOSIS — M797 Fibromyalgia: Secondary | ICD-10-CM | POA: Diagnosis not present

## 2023-01-18 DIAGNOSIS — R300 Dysuria: Secondary | ICD-10-CM

## 2023-01-18 DIAGNOSIS — N3000 Acute cystitis without hematuria: Secondary | ICD-10-CM

## 2023-01-18 LAB — URINALYSIS, ROUTINE W REFLEX MICROSCOPIC
Bilirubin, UA: NEGATIVE
Glucose, UA: NEGATIVE
Ketones, UA: NEGATIVE
Nitrite, UA: POSITIVE — AB
Protein,UA: NEGATIVE
Specific Gravity, UA: 1.015 (ref 1.005–1.030)
Urobilinogen, Ur: 0.2 mg/dL (ref 0.2–1.0)
pH, UA: 7 (ref 5.0–7.5)

## 2023-01-18 LAB — MICROSCOPIC EXAMINATION

## 2023-01-18 MED ORDER — PHENAZOPYRIDINE HCL 100 MG PO TABS: 100.0000 mg | ORAL_TABLET | Freq: Three times a day (TID) | ORAL | 0 refills | Status: DC | PRN

## 2023-01-18 MED ORDER — CEFDINIR 300 MG PO CAPS: 300.0000 mg | ORAL_CAPSULE | Freq: Two times a day (BID) | ORAL | 0 refills | Status: AC

## 2023-01-18 NOTE — Progress Notes (Signed)
Acute Office Visit   Patient: Angela Martinez   DOB: 1951-09-08   72 y.o. Female  MRN: [id number] Visit Date: 01/18/2023  Today's healthcare provider: Dani Gobble Daliah Chaudoin, PA-C  Introduced myself to the patient as a Journalist, newspaper and provided education on APPs in clinical practice.    Chief Complaint  Patient presents with   frequency of urination    Started about 2 days ago, urine also has odor.   Subjective    HPI HPI     frequency of urination    Additional comments: Started about 2 days ago, urine also has odor.      Last edited by Jerelene Redden, CMA on 01/18/2023 10:40 AM.      Concern for UTI  Reports she had several days of diarrhea, starting on Sat and ending Tues  She reports a few instances of bowel incontinence so thinks this may have led to a UTI   Onset: sudden  Duration: seemed to start on Sunday   Associated symptoms: reports dysuria, increased urinary frequency, some urinary hesitancy, urine has a odor   Interventions: nothing      Medications: Outpatient Medications Prior to Visit  Medication Sig   alendronate (FOSAMAX) 70 MG tablet Take 1 tablet (70 mg total) by mouth every 7 (seven) days. Take with a full glass of water on an empty stomach.   b complex vitamins capsule Take 1 capsule by mouth daily.   desonide (DESOWEN) 0.05 % cream Apply topically 2 (two) times daily. Apply for 5 days and then stop, may repeat as needed.   FLUoxetine (PROZAC) 20 MG capsule TAKE 1 CAPSULE total) by mouth daily.   gabapentin (NEURONTIN) 300 MG capsule Take 1 capsule (300 mg total) by mouth 2 (two) times daily.   levothyroxine (SYNTHROID) 25 MCG tablet Take 1 tablet (25 mcg total) by mouth daily.   magnesium citrate SOLN Take 1 Bottle by mouth once.   NAPROXEN PO Take by mouth as needed.   omeprazole (PRILOSEC) 20 MG capsule Take 1 capsule (20 mg total) by mouth daily.   valACYclovir (VALTREX) 500 MG tablet Take 1 tablet (500 mg total) by mouth as needed.   Vitamin  D, Cholecalciferol, 400 UNITS TABS Take 1,000 Units by mouth.    vitamin E 180 MG (400 UNITS) capsule Take 400 Units by mouth daily.   No facility-administered medications prior to visit.    Review of Systems  Gastrointestinal:  Positive for abdominal pain and diarrhea.  Genitourinary:  Positive for dysuria and frequency. Negative for decreased urine volume, difficulty urinating, enuresis, flank pain, hematuria, urgency, vaginal bleeding, vaginal discharge and vaginal pain.       Objective    BP 126/72 (BP Location: Left Arm, Patient Position: Sitting, Cuff Size: Normal)   Ht '5\' 5"'$  (1.651 m)   Wt 177 lb (80.3 kg)   BMI 29.45 kg/m    Physical Exam Vitals reviewed.  Constitutional:      General: She is awake.     Appearance: Normal appearance. She is well-developed and well-groomed.  HENT:     Head: Normocephalic and atraumatic.  Eyes:     General: Lids are normal. Gaze aligned appropriately.     Extraocular Movements: Extraocular movements intact.     Conjunctiva/sclera: Conjunctivae normal.  Pulmonary:     Effort: Pulmonary effort is normal.     Breath sounds: No decreased air movement. No decreased breath sounds, wheezing, rhonchi or rales.  Abdominal:     General: Abdomen is flat. Bowel sounds are normal.     Palpations: Abdomen is soft.     Tenderness: There is no abdominal tenderness. There is no right CVA tenderness or left CVA tenderness.  Musculoskeletal:     Cervical back: Normal range of motion.  Neurological:     Mental Status: She is alert.  Psychiatric:        Attention and Perception: Attention and perception normal.        Mood and Affect: Mood and affect normal.        Speech: Speech normal.        Behavior: Behavior normal. Behavior is cooperative.        Results for orders placed or performed in visit on 01/18/23  Microscopic Examination   Urine  Result Value Ref Range   WBC, UA 6-10 (A) 0 - 5 /hpf   RBC, Urine 0-2 0 - 2 /hpf   Epithelial  Cells (non renal) 0-10 0 - 10 /hpf   Bacteria, UA Many (A) None seen/Few  Urinalysis, Routine w reflex microscopic  Result Value Ref Range   Specific Gravity, UA 1.015 1.005 - 1.030   pH, UA 7.0 5.0 - 7.5   Color, UA Yellow Yellow   Appearance Ur Turbid (A) Clear   Leukocytes,UA 1+ (A) Negative   Protein,UA Negative Negative/Trace   Glucose, UA Negative Negative   Ketones, UA Negative Negative   RBC, UA Trace (A) Negative   Bilirubin, UA Negative Negative   Urobilinogen, Ur 0.2 0.2 - 1.0 mg/dL   Nitrite, UA Positive (A) Negative   Microscopic Examination See below:     Assessment & Plan      No follow-ups on file.      Problem List Items Addressed This Visit   None Visit Diagnoses     Acute cystitis without hematuria    -  Primary Acute, new concern  She reports several episodes of diarrhea and bowel incontinence which she believes has led to current UTI symptoms Reports symptoms of dysuria, increased frequency and hesitancy over the past 4-5 days UA was concerning for leukocytes, nitrites, bacteria - will send for culture for further ID and susceptibility  Will send in Omnicef 300 mg PO BID x 5 days due to allergies and age preventing other first lines Will send in Pyridium 100 mg PO TID PRN to assist with discomfort  Culture results to further dictate management Follow up as needed for persistent progressing symptoms    Relevant Medications   phenazopyridine (PYRIDIUM) 100 MG tablet   cefdinir (OMNICEF) 300 MG capsule   Other Relevant Orders   Urine Culture   Frequency of urination       Relevant Orders   Urinalysis, Routine w reflex microscopic   Dysuria       Relevant Medications   phenazopyridine (PYRIDIUM) 100 MG tablet   Pyuria       Relevant Orders   Urine Culture        No follow-ups on file.   I, Nainoa Woldt E Faysal Fenoglio, PA-C, have reviewed all documentation for this visit. The documentation on 01/18/23 for the exam, diagnosis, procedures, and orders are  all accurate and complete.   Talitha Givens, MHS, PA-C [facility]

## 2023-01-18 NOTE — Progress Notes (Signed)
Your UA was concerning for  a UTI. I have sent in a script for an antibiotic called Omnicef. Please take this by mouth twice per day for 5 days unless you are instructed to stop or if you have an allergic reaction. I have also sent in a script for Pyridium to assist with the discomfort. Please let us know if you have further questions or concerns.

## 2023-01-21 LAB — URINE CULTURE

## 2023-01-22 DIAGNOSIS — M797 Fibromyalgia: Secondary | ICD-10-CM | POA: Diagnosis not present

## 2023-01-22 DIAGNOSIS — M159 Polyosteoarthritis, unspecified: Secondary | ICD-10-CM | POA: Diagnosis not present

## 2023-01-22 NOTE — Progress Notes (Signed)
Your urine culture demonstrates that you had a UTI caused by E.coli The Omnicef that I sent in to treat this should be effective so please finish the entire course and stay well hydrated.

## 2023-01-25 DIAGNOSIS — M159 Polyosteoarthritis, unspecified: Secondary | ICD-10-CM | POA: Diagnosis not present

## 2023-01-25 DIAGNOSIS — M797 Fibromyalgia: Secondary | ICD-10-CM | POA: Diagnosis not present

## 2023-01-26 ENCOUNTER — Telehealth: Payer: Self-pay

## 2023-01-26 ENCOUNTER — Ambulatory Visit: Payer: PPO | Admitting: Podiatry

## 2023-01-26 NOTE — Telephone Encounter (Signed)
Paperwork faxed back to [facility]  

## 2023-02-06 DIAGNOSIS — M159 Polyosteoarthritis, unspecified: Secondary | ICD-10-CM | POA: Diagnosis not present

## 2023-02-06 DIAGNOSIS — M797 Fibromyalgia: Secondary | ICD-10-CM | POA: Diagnosis not present

## 2023-02-09 ENCOUNTER — Other Ambulatory Visit: Payer: Self-pay

## 2023-02-09 ENCOUNTER — Ambulatory Visit (INDEPENDENT_AMBULATORY_CARE_PROVIDER_SITE_OTHER): Payer: PPO

## 2023-02-09 ENCOUNTER — Ambulatory Visit: Payer: PPO | Admitting: Podiatry

## 2023-02-09 ENCOUNTER — Encounter: Payer: Self-pay | Admitting: Podiatry

## 2023-02-09 VITALS — BP 155/74 | HR 73

## 2023-02-09 DIAGNOSIS — M76821 Posterior tibial tendinitis, right leg: Secondary | ICD-10-CM

## 2023-02-09 DIAGNOSIS — M7751 Other enthesopathy of right foot: Secondary | ICD-10-CM | POA: Diagnosis not present

## 2023-02-09 NOTE — Progress Notes (Signed)
Subjective:  Patient ID: Angela Martinez, female    DOB: 1950/12/11,  MRN: [id number]  Chief Complaint  Patient presents with   Foot Pain    "My ankle, I heard something snap about 10 to 12 mos ago." N - ankle pain L - medial ankle D - 10-12 mos O - suddenly, gotten worse C - swelling, tender, sore, snapping noise A - walking T - Tumeric, Gabapentin    72 y.o. female presents with the above complaint.  Patient presents with right medial foot pain that has been going for quite some time has been on for a year.  Came on all of a sudden she has been walking she wanted discuss treatment options for it.  Rest of the HPI above   Review of Systems: Negative except as noted in the HPI. Denies N/V/F/Ch.  Past Medical History:  Diagnosis Date   Anxiety    Complication of anesthesia    WITH WRIST SURGERY HARD TO GET RELAXED. OK WITH KNEE / BACK   Cough    Depression    Fibromyalgia    GERD (gastroesophageal reflux disease)    Herpes simplex    1 AND 2   Hyperlipidemia    OA (osteoarthritis)    Osteopenia    Overweight    Plantar fasciitis    Shortness of breath dyspnea    DOE   Tremors of nervous system     Current Outpatient Medications:    alendronate (FOSAMAX) 70 MG tablet, Take 1 tablet (70 mg total) by mouth every 7 (seven) days. Take with a full glass of water on an empty stomach., Disp: 4 tablet, Rfl: 11   b complex vitamins capsule, Take 1 capsule by mouth daily., Disp: , Rfl:    desonide (DESOWEN) 0.05 % cream, Apply topically 2 (two) times daily. Apply for 5 days and then stop, may repeat as needed., Disp: 30 g, Rfl: 1   FLUoxetine (PROZAC) 20 MG capsule, TAKE 1 CAPSULE total) by mouth daily., Disp: 90 capsule, Rfl: 4   gabapentin (NEURONTIN) 300 MG capsule, Take 1 capsule (300 mg total) by mouth 2 (two) times daily., Disp: 180 capsule, Rfl: 4   levothyroxine (SYNTHROID) 25 MCG tablet, Take 1 tablet (25 mcg total) by mouth daily., Disp: 30 tablet, Rfl: 3   magnesium  citrate SOLN, Take 1 Bottle by mouth once., Disp: , Rfl:    NAPROXEN PO, Take by mouth as needed., Disp: , Rfl:    omeprazole (PRILOSEC) 20 MG capsule, Take 1 capsule (20 mg total) by mouth daily., Disp: 90 capsule, Rfl: 4   phenazopyridine (PYRIDIUM) 100 MG tablet, Take 1 tablet (100 mg total) by mouth 3 (three) times daily as needed for pain., Disp: 10 tablet, Rfl: 0   valACYclovir (VALTREX) 500 MG tablet, Take 1 tablet (500 mg total) by mouth as needed., Disp: 30 tablet, Rfl: 4   Vitamin D, Cholecalciferol, 400 UNITS TABS, Take 1,000 Units by mouth. , Disp: , Rfl:    vitamin E 180 MG (400 UNITS) capsule, Take 400 Units by mouth daily., Disp: , Rfl:   Social History   Tobacco Use  Smoking Status Never  Smokeless Tobacco Never    Allergies  Allergen Reactions   Zetia [Ezetimibe]     Muscle cramps severe   Atorvastatin     Muscle cramps   Crestor [Rosuvastatin Calcium]     Muscle aches   Elemental Sulfur    Lyrica [Pregabalin] Other (See Comments)    Spaced  out   Amoxicillin-Pot Clavulanate Nausea Only   Objective:   Vitals:   02/09/23 0746  BP: (!) 155/74  Pulse: 73   There is no height or weight on file to calculate BMI. Constitutional Well developed. Well nourished.  Vascular Dorsalis pedis pulses palpable bilaterally. Posterior tibial pulses palpable bilaterally. Capillary refill normal to all digits.  No cyanosis or clubbing noted. Pedal hair growth normal.  Neurologic Normal speech. Oriented to person, place, and time. Epicritic sensation to light touch grossly present bilaterally.  Dermatologic Nails well groomed and normal in appearance. No open wounds. No skin lesions.  Orthopedic: Pain on palpation along the course of the posterior tibial tendon pain with resisted inversion plantarflexion of the foot no pain with dorsiflexion eversion of the foot.  No pain at the Achilles tendon peroneal tendon ATFL ligament   Radiographs: 3 views of skeletally mature  adult right ankle:Ostial redness noted of the ankle joint and the midfoot joint the subtalar joint previous hardware noted from bunion deformity.  No other bony abnormalities identified.  Previous history of fractures noted appears to be completely consolidated Assessment:   1. Posterior tibial tendinitis, right    Plan:  Patient was evaluated and treated and all questions answered.  Right posterior tibial tendinitis -All questions and concerns were discussed with the patient in extensive detail -Given the amount of pain that she is experiencing she will benefit from cam boot immobilization to help decrease acute inflammation associated pain.  Patient agrees would like to proceed with cam boot immobilization -If there is some improvement we will discuss steroid injection versus MRI during next visit  No follow-ups on file.

## 2023-02-10 NOTE — Patient Instructions (Signed)

## 2023-02-13 ENCOUNTER — Encounter: Payer: Self-pay | Admitting: Nurse Practitioner

## 2023-02-13 ENCOUNTER — Ambulatory Visit (INDEPENDENT_AMBULATORY_CARE_PROVIDER_SITE_OTHER): Payer: PPO | Admitting: Nurse Practitioner

## 2023-02-13 VITALS — BP 132/80 | HR 68 | Temp 97.6°F | Ht 65.0 in | Wt 176.5 lb

## 2023-02-13 DIAGNOSIS — M159 Polyosteoarthritis, unspecified: Secondary | ICD-10-CM | POA: Diagnosis not present

## 2023-02-13 DIAGNOSIS — M797 Fibromyalgia: Secondary | ICD-10-CM | POA: Diagnosis not present

## 2023-02-13 DIAGNOSIS — M25561 Pain in right knee: Secondary | ICD-10-CM | POA: Diagnosis not present

## 2023-02-13 DIAGNOSIS — G8929 Other chronic pain: Secondary | ICD-10-CM | POA: Insufficient documentation

## 2023-02-13 DIAGNOSIS — E039 Hypothyroidism, unspecified: Secondary | ICD-10-CM | POA: Diagnosis not present

## 2023-02-13 MED ORDER — GABAPENTIN 300 MG PO CAPS: 300.0000 mg | ORAL_CAPSULE | Freq: Three times a day (TID) | ORAL | 3 refills | Status: DC

## 2023-02-13 NOTE — Assessment & Plan Note (Signed)
Chronic, ongoing.  Continue Levothyroxine as ordered and adjust if needed.  Labs today.

## 2023-02-13 NOTE — Assessment & Plan Note (Signed)
Ongoing for over 4 months. History of left knee replacement.  Suspect some significant OA present.  Will get her into ortho for further assessment.  Recommend ice on knee and wearing knee compression support.  Continue at home pain regimen which is offering benefit.

## 2023-02-13 NOTE — Progress Notes (Signed)
BP 132/80 (BP Location: Left Arm, Patient Position: Sitting, Cuff Size: Normal)   Pulse 68   Temp 97.6 F (36.4 C) (Oral)   Ht 5\' 5"  (1.651 m)   Wt 176 lb 8 oz (80.1 kg)   SpO2 99%   BMI 29.37 kg/m    Subjective:    Patient ID: Angela Martinez, female    DOB: 1951-06-01, 72 y.o.   MRN: [id number]  HPI: NAPHTALI Martinez is a 72 y.o. female  Chief Complaint  Patient presents with   Hypothyroidism   Knee Pain    Patient states that her right knee has been swollen for a good little while, at least 4 months.    HYPOTHYROIDISM Started on Levothyroxine 25 MCG 6 weeks ago and is tolerating. No longer Biotin.  Is noticing some splitting of her fingernails. Thyroid control status:stable Satisfied with current treatment? yes Medication side effects: no Medication compliance: good compliance Etiology of hypothyroidism:  Recent dose adjustment:no Fatigue:  a little  Cold intolerance: no Heat intolerance: no Weight gain: no Weight loss: no Constipation: no Diarrhea/loose stools:  occasional Palpitations: no Lower extremity edema: no Anxiety/depressed mood: no     02/13/2023    9:10 AM 01/18/2023   11:00 AM 12/28/2022    8:45 AM 09/26/2022   10:15 AM 03/28/2022   10:48 AM  Depression screen PHQ 2/9  Decreased Interest 1 0 1 1 1   Down, Depressed, Hopeless 1 1 1 1  0  PHQ - 2 Score 2 1 2 2 1   Altered sleeping 1 0 1 2 1   Tired, decreased energy 1 1 1 1 1   Change in appetite 0 0 2 2 1   Feeling bad or failure about yourself  0 0 1 1 0  Trouble concentrating 1 1 0 0 0  Moving slowly or fidgety/restless 0 0 0 1 0  Suicidal thoughts 0 0 0 0 0  PHQ-9 Score 5 3 7 9 4   Difficult doing work/chores Not difficult at all Not difficult at all Not difficult at all Not difficult at all Not difficult at all       02/13/2023    9:10 AM 01/18/2023   11:01 AM 12/28/2022    8:45 AM 09/26/2022   10:16 AM  GAD 7 : Generalized Anxiety Score  Nervous, Anxious, on Edge 0 1 1 1   Control/stop worrying 0 1 0 1   Worry too much - different things 1 0 0 1  Trouble relaxing 1 1 1 1   Restless 1 0 1 1  Easily annoyed or irritable 0 1 1 1   Afraid - awful might happen 1 0 0 1  Total GAD 7 Score 4 4 4 7   Anxiety Difficulty Not difficult at all Not difficult at all Not difficult at all Not difficult at all   KNEE PAIN (RIGHT) Ongoing knee pain with swelling for over 4 months present.  Has underlying osteoporosis -- taking Fosamax.  History of knee replacement left side -- had wanted Dr. Marry Guan to do it, but ended up going to [place].   Duration: months Involved knee: right Mechanism of injury: unknown Location:diffuse Onset: gradual Severity: 8/10  Quality:  dull, aching, and throbbing Frequency: intermittent Radiation: no Aggravating factors: weight bearing, walking, stairs, bending, and movement == although push mowing helps it Alleviating factors: Gabapentin, Naproxen, Tylenol ES Status: fluctuating Treatments attempted: Gabapentin, Naproxen, Tylenol ES Relief with NSAIDs?:  moderate Weakness with weight bearing or walking: yes Sensation of giving way: yes Locking:  yes Popping: yes Bruising: no Swelling: yes Redness: no Paresthesias/decreased sensation: no Fevers: no   Relevant past medical, surgical, family and social history reviewed and updated as indicated. Interim medical history since our last visit reviewed. Allergies and medications reviewed and updated.  Review of Systems  Constitutional:  Negative for activity change, appetite change, diaphoresis, fatigue and fever.  Respiratory:  Negative for cough, chest tightness, shortness of breath and wheezing.   Cardiovascular:  Negative for chest pain, palpitations and leg swelling.  Gastrointestinal: Negative.   Endocrine: Negative for cold intolerance, heat intolerance, polydipsia, polyphagia and polyuria.  Musculoskeletal:  Positive for arthralgias.  Neurological: Negative.   Psychiatric/Behavioral: Negative.      Per HPI  unless specifically indicated above     Objective:    BP 132/80 (BP Location: Left Arm, Patient Position: Sitting, Cuff Size: Normal)   Pulse 68   Temp 97.6 F (36.4 C) (Oral)   Ht 5\' 5"  (1.651 m)   Wt 176 lb 8 oz (80.1 kg)   SpO2 99%   BMI 29.37 kg/m   Wt Readings from Last 3 Encounters:  02/13/23 176 lb 8 oz (80.1 kg)  01/18/23 177 lb (80.3 kg)  12/28/22 177 lb 3.2 oz (80.4 kg)    Physical Exam Vitals and nursing note reviewed.  Constitutional:      General: She is awake. She is not in acute distress.    Appearance: She is well-developed, well-groomed and overweight. She is not ill-appearing.  HENT:     Head: Normocephalic.     Right Ear: Hearing normal.     Left Ear: Hearing normal.  Eyes:     General: Lids are normal.        Right eye: No discharge.        Left eye: No discharge.     Conjunctiva/sclera: Conjunctivae normal.     Pupils: Pupils are equal, round, and reactive to light.  Neck:     Thyroid: No thyromegaly.     Vascular: No carotid bruit.  Cardiovascular:     Rate and Rhythm: Normal rate and regular rhythm.     Heart sounds: Normal heart sounds. No murmur heard.    No gallop.  Pulmonary:     Effort: Pulmonary effort is normal. No accessory muscle usage or respiratory distress.     Breath sounds: Normal breath sounds.  Abdominal:     General: Bowel sounds are normal. There is no distension.     Palpations: Abdomen is soft.     Tenderness: There is no abdominal tenderness.  Musculoskeletal:     Cervical back: Normal range of motion and neck supple.     Right knee: Swelling and crepitus present. No erythema, ecchymosis or bony tenderness. Decreased range of motion. Tenderness present over the medial joint line. Normal meniscus. Normal pulse.     Instability Tests: Anterior drawer test negative. Posterior drawer test negative. Medial McMurray test negative and lateral McMurray test negative.     Left knee: Normal.     Right lower leg: No edema.      Left lower leg: No edema.  Skin:    General: Skin is warm and dry.  Neurological:     Mental Status: She is alert and oriented to person, place, and time.  Psychiatric:        Attention and Perception: Attention normal.        Mood and Affect: Mood normal.        Speech: Speech normal.  Behavior: Behavior normal. Behavior is cooperative.        Thought Content: Thought content normal.     Results for orders placed or performed in visit on 01/18/23  Urine Culture   Specimen: Urine   UR  Result Value Ref Range   Urine Culture, Routine Final report (A)    Organism ID, Bacteria Escherichia coli (A)    Antimicrobial Susceptibility Comment   Microscopic Examination   Urine  Result Value Ref Range   WBC, UA 6-10 (A) 0 - 5 /hpf   RBC, Urine 0-2 0 - 2 /hpf   Epithelial Cells (non renal) 0-10 0 - 10 /hpf   Bacteria, UA Many (A) None seen/Few  Urinalysis, Routine w reflex microscopic  Result Value Ref Range   Specific Gravity, UA 1.015 1.005 - 1.030   pH, UA 7.0 5.0 - 7.5   Color, UA Yellow Yellow   Appearance Ur Turbid (A) Clear   Leukocytes,UA 1+ (A) Negative   Protein,UA Negative Negative/Trace   Glucose, UA Negative Negative   Ketones, UA Negative Negative   RBC, UA Trace (A) Negative   Bilirubin, UA Negative Negative   Urobilinogen, Ur 0.2 0.2 - 1.0 mg/dL   Nitrite, UA Positive (A) Negative   Microscopic Examination See below:       Assessment & Plan:   Problem List Items Addressed This Visit       Endocrine   Hypothyroidism - Primary (Chronic)    Chronic, ongoing.  Continue Levothyroxine as ordered and adjust if needed.  Labs today.      Relevant Orders   T4, free   TSH     Other   Chronic pain of right knee    Ongoing for over 4 months. History of left knee replacement.  Suspect some significant OA present.  Will get her into ortho for further assessment.  Recommend ice on knee and wearing knee compression support.  Continue at home pain regimen which  is offering benefit.      Relevant Orders   Ambulatory referral to Orthopedic Surgery     Follow up plan: Return for as scheduled August 8th for follow-up.

## 2023-02-14 LAB — T4, FREE: Free T4: 1.12 ng/dL (ref 0.82–1.77)

## 2023-02-14 LAB — TSH: TSH: 3.45 u[IU]/mL (ref 0.450–4.500)

## 2023-02-14 NOTE — Progress Notes (Signed)
Good afternoon, please let Angela Martinez know her labs have returned and thyroid labs have improved.  Continue current Levothyroxine dosing as is offering benefit.  We will check again at future visit.  Any questions? Keep being amazing!!  Thank you for allowing me to participate in your care.  I appreciate you. Kindest regards, Barrie Wale

## 2023-02-19 ENCOUNTER — Telehealth: Payer: Self-pay | Admitting: Nurse Practitioner

## 2023-02-19 NOTE — Telephone Encounter (Signed)
Copied from [place] [id number]. Topic: Medicare AWV >> Feb 19, 2023  2:19 PM Devoria Glassing wrote: Reason for CRM: Called patient to schedule Medicare Annual Wellness Visit (AWV). Left message for patient to call back and schedule Medicare Annual Wellness Visit (AWV).  Last date of AWV: 07/26/2018  Please schedule an appointment at any time with. Kirke Shaggy, LPN   .  If any questions, please contact me  Thank you ,  Sherol Dade; [place]: [phone]

## 2023-02-26 ENCOUNTER — Telehealth: Payer: Self-pay | Admitting: Podiatry

## 2023-02-26 NOTE — Telephone Encounter (Signed)
Pt called and cxled her appt for 4.19. She wore the boot that she was given but it did not help at all. She has gotten some application to put on and it has helped. She is going to have knee checked soon. She will call if needed.

## 2023-03-01 DIAGNOSIS — M1711 Unilateral primary osteoarthritis, right knee: Secondary | ICD-10-CM | POA: Diagnosis not present

## 2023-03-05 ENCOUNTER — Encounter: Payer: Self-pay | Admitting: Internal Medicine

## 2023-03-05 ENCOUNTER — Ambulatory Visit: Payer: PPO | Attending: Internal Medicine | Admitting: Internal Medicine

## 2023-03-05 VITALS — BP 136/82 | HR 80 | Ht 62.0 in | Wt 178.4 lb

## 2023-03-05 DIAGNOSIS — Z8249 Family history of ischemic heart disease and other diseases of the circulatory system: Secondary | ICD-10-CM

## 2023-03-05 DIAGNOSIS — M791 Myalgia, unspecified site: Secondary | ICD-10-CM

## 2023-03-05 DIAGNOSIS — T466X5A Adverse effect of antihyperlipidemic and antiarteriosclerotic drugs, initial encounter: Secondary | ICD-10-CM

## 2023-03-05 DIAGNOSIS — E785 Hyperlipidemia, unspecified: Secondary | ICD-10-CM | POA: Diagnosis not present

## 2023-03-05 NOTE — Patient Instructions (Addendum)
Medication Instructions:  No Changes In Medications at this time.  *If you need a refill on your cardiac medications before your next appointment, please call your pharmacy*  Lab Work: None Ordered At This Time.  If you have labs (blood work) drawn today and your tests are completely normal, you will receive your results only by: MyChart Message (if you have MyChart) OR A paper copy in the mail If you have any lab test that is abnormal or we need to change your treatment, we will call you to review the results.  Testing/Procedures:  Dr. Rennis Golden has ordered a CT coronary calcium score.   Test locations:  [facility]   This is $99 out of pocket.   Coronary CalciumScan A coronary calcium scan is an imaging test used to look for deposits of calcium and other fatty materials (plaques) in the inner lining of the blood vessels of the heart (coronary arteries). These deposits of calcium and plaques can partly clog and narrow the coronary arteries without producing any symptoms or warning signs. This puts a person at risk for a heart attack. This test can detect these deposits before symptoms develop. Tell a health care provider about: Any allergies you have. All medicines you are taking, including vitamins, herbs, eye drops, creams, and over-the-counter medicines. Any problems you or family members have had with anesthetic medicines. Any blood disorders you have. Any surgeries you have had. Any medical conditions you have. Whether you are pregnant or may be pregnant. What are the risks? Generally, this is a safe procedure. However, problems may occur, including: Harm to a pregnant woman and her unborn baby. This test involves the use of radiation. Radiation exposure can be dangerous to a pregnant woman and her unborn baby. If you are pregnant, you generally should not have this procedure done. Slight increase in the risk of cancer. This is because of the  radiation involved in the test. What happens before the procedure? No preparation is needed for this procedure. What happens during the procedure? You will undress and remove any jewelry around your neck or chest. You will put on a hospital gown. Sticky electrodes will be placed on your chest. The electrodes will be connected to an electrocardiogram (ECG) machine to record a tracing of the electrical activity of your heart. A CT scanner will take pictures of your heart. During this time, you will be asked to lie still and hold your breath for 2-3 seconds while a picture of your heart is being taken. The procedure may vary among health care providers and hospitals. What happens after the procedure? You can get dressed. You can return to your normal activities. It is up to you to get the results of your test. Ask your health care provider, or the department that is doing the test, when your results will be ready. Summary A coronary calcium scan is an imaging test used to look for deposits of calcium and other fatty materials (plaques) in the inner lining of the blood vessels of the heart (coronary arteries). Generally, this is a safe procedure. Tell your health care provider if you are pregnant or may be pregnant. No preparation is needed for this procedure. A CT scanner will take pictures of your heart. You can return to your normal activities after the scan is done. This information is not intended to replace advice given to you by your health care provider. Make sure you discuss any questions you have with your health care  provider. Document Released: 05/04/2008 Document Revised: 09/25/2016 Document Reviewed: 09/25/2016 Elsevier Interactive Patient Education  2017 [employer].  Follow-Up: At [facility], you and your health needs are our priority.  As part of our continuing mission to provide you with exceptional heart care, we have created designated Provider Care Teams.  These  Care Teams include your primary Cardiologist (physician) and Advanced Practice Providers (APPs -  Physician Assistants and Nurse Practitioners) who all work together to provide you with the care you need, when you need it.  Your next appointment:   6 month(s) LIPID CLINIC  Provider:   Chrystie Nose, MD     DIETARY INFORMATION GIVEN

## 2023-03-05 NOTE — Progress Notes (Signed)
LIPID CLINIC CONSULT NOTE  Chief Complaint:  Manage dyslipidemia  Primary Care Physician: Marjie Skiff, NP  Primary Cardiologist:  Chrystie Nose, MD  HPI:  Angela Martinez is a 72 y.o. female who is being seen today for the evaluation of dyslipidemia at the request of Marjie Skiff, NP. This is a pleasant 72 year old female kindly referred for evaluation management of dyslipidemia.  She reports longstanding history of elevated cholesterol and family history of high cholesterol.  Her father currently died of heart attack at age 47.  She has no personal heart issues, however and has no history of high blood pressure or diabetes.  In review of her imaging she has no known coronary calcification or atherosclerosis.  In the past she has been on numerous statin medications.  These include atorvastatin, rosuvastatin and she is also been on ezetimibe.  All of them caused severe cramping and joint aches.  She was then referred here for statin alternatives.  She reports problems with her knees to the point where she was disabled and had to retire early.  She is still contemplating surgery to her right knee which is "bone-on-bone".  She does however push mow her lawn regularly.  And generally she is very active.  Last summer she had a couple episodes where she was very fatigued mowing her lawn in the middle of the day in the heat but otherwise has been able to do that without any issue.  She denies any chest pain.  PMHx:  Past Medical History:  Diagnosis Date   Anxiety    Complication of anesthesia    WITH WRIST SURGERY HARD TO GET RELAXED. OK WITH KNEE / BACK   Cough    Depression    Fibromyalgia    GERD (gastroesophageal reflux disease)    Herpes simplex    1 AND 2   Hyperlipidemia    OA (osteoarthritis)    Osteopenia    Overweight    Plantar fasciitis    Shortness of breath dyspnea    DOE   Tremors of nervous system     Past Surgical History:  Procedure Laterality Date   BACK  SURGERY     BUNIONECTOMY     CATARACT EXTRACTION W/PHACO Right 03/16/2016   Procedure: CATARACT EXTRACTION PHACO AND INTRAOCULAR LENS PLACEMENT (IOC);  Surgeon: Lockie Mola, MD;  Location: [facility] ORS;  Service: Ophthalmology;  Laterality: Right;  [place] 52.4 AP% 8.8 CDE 4.63 Fluid Pack Lot # [id number]   CATARACT EXTRACTION W/PHACO Left 05/18/2016   Procedure: CATARACT EXTRACTION PHACO AND INTRAOCULAR LENS PLACEMENT (IOC);  Surgeon: Lockie Mola, MD;  Location: [facility] ORS;  Service: Ophthalmology;  Laterality: Left;  [place] 47.3 AP% 10.4 CDE 4.90 Fluid pack lot # [id number]   DILATION AND CURETTAGE OF UTERUS     x 3   JOINT REPLACEMENT     LUMBAR FUSION     L4-L5 with cage   TOTAL KNEE ARTHROPLASTY Left 2007   WRIST SURGERY      FAMHx:  Family History  Problem Relation Age of Onset   Alcohol abuse Father    Alcohol abuse Sister    Arthritis Sister        5 sisters, some with RA   Seizures Sister    Cancer Sister        breast   Cirrhosis Sister    Diabetes Brother    Breast cancer Neg Hx     SOCHx:   reports that she has never  smoked. She has never used smokeless tobacco. She reports that she does not drink alcohol and does not use drugs.  ALLERGIES:  Allergies  Allergen Reactions   Zetia [Ezetimibe]     Muscle cramps severe   Atorvastatin     Muscle cramps   Crestor [Rosuvastatin Calcium]     Muscle aches   Elemental Sulfur    Lyrica [Pregabalin] Other (See Comments)    Spaced out   Amoxicillin-Pot Clavulanate Nausea Only    ROS: Pertinent items noted in HPI and remainder of comprehensive ROS otherwise negative.  HOME MEDS: Current Outpatient Medications on File Prior to Visit  Medication Sig Dispense Refill   alendronate (FOSAMAX) 70 MG tablet Take 1 tablet (70 mg total) by mouth every 7 (seven) days. Take with a full glass of water on an empty stomach. 4 tablet 11   b complex vitamins capsule Take 1 capsule by mouth daily.     desonide (DESOWEN) 0.05 %  cream Apply topically 2 (two) times daily. Apply for 5 days and then stop, may repeat as needed. 30 g 1   FLUoxetine (PROZAC) 20 MG capsule TAKE 1 CAPSULE total) by mouth daily. 90 capsule 4   gabapentin (NEURONTIN) 300 MG capsule Take 1 capsule (300 mg total) by mouth 3 (three) times daily. 270 capsule 3   levothyroxine (SYNTHROID) 25 MCG tablet Take 1 tablet (25 mcg total) by mouth daily. 30 tablet 3   magnesium citrate SOLN Take 1 Bottle by mouth once.     NAPROXEN PO Take by mouth as needed.     omeprazole (PRILOSEC) 20 MG capsule Take 1 capsule (20 mg total) by mouth daily. 90 capsule 4   phenazopyridine (PYRIDIUM) 100 MG tablet Take 1 tablet (100 mg total) by mouth 3 (three) times daily as needed for pain. 10 tablet 0   valACYclovir (VALTREX) 500 MG tablet Take 1 tablet (500 mg total) by mouth as needed. 30 tablet 4   Vitamin D, Cholecalciferol, 400 UNITS TABS Take 1,000 Units by mouth.      vitamin E 180 MG (400 UNITS) capsule Take 400 Units by mouth daily.     [DISCONTINUED] fluticasone (FLONASE) 50 MCG/ACT nasal spray Place 2 sprays into both nostrils daily. 16 g 6   No current facility-administered medications on file prior to visit.    LABS/IMAGING: No results found for this or any previous visit (from the past 48 hour(s)). No results found.  LIPID PANEL:    Component Value Date/Time   CHOL 222 (H) 12/28/2022 0917   CHOL 215 (H) 12/03/2018 0814   TRIG 137 12/28/2022 0917   TRIG 253 (H) 12/03/2018 0814   HDL 46 12/28/2022 0917   CHOLHDL 3.8 03/28/2022 1104   VLDL 51 (H) 12/03/2018 0814   LDLCALC 151 (H) 12/28/2022 0917    WEIGHTS: Wt Readings from Last 3 Encounters:  03/05/23 178 lb 6.4 oz (80.9 kg)  02/13/23 176 lb 8 oz (80.1 kg)  01/18/23 177 lb (80.3 kg)    VITALS: BP 136/82   Pulse 80   Ht 5\' 2"  (1.575 m)   Wt 178 lb 6.4 oz (80.9 kg)   SpO2 98%   BMI 32.63 kg/m   EXAM: General appearance: alert and no distress Lungs: clear to auscultation  bilaterally Heart: regular rate and rhythm, S1, S2 normal, no murmur, click, rub or gallop Extremities: extremities normal, atraumatic, no cyanosis or edema Neurologic: Grossly normal  EKG: Deferred  ASSESSMENT: Mixed dyslipidemia Statin and ezetimibe intolerance-myalgias Family  history of coronary artery disease in her father, not premature onset  PLAN: 1.   Mrs. Akerson has a mixed dyslipidemia and has been intolerant to statins and ezetimibe.  There is a family history of heart disease in her father but not premature onset.  She has few traditional cardiovascular risk factors, namely she does not have hypertension or diabetes, however age is a risk factor.  Currently I would classify her as low to moderate risk.  There is family history but not early onset.  I like to further define her risk and would recommend a noninvasive coronary risk ratified her.  Will go ahead and obtain a coronary calcium score.  I have provided her with some heart healthy diet information.  Based on our discussion it sounds like she could improve her diet somewhat I think that would help her cholesterol numbers as well.  I have also encouraged her to be as active as she can.  Will plan repeat lipids and follow-up in 6 months and based on her imaging may consider additional therapies, but at this time she does not qualify for additional therapies due to lack of demonstrated cardiovascular disease and not being high risk.  Thanks again for the kind referral.  Follow-up with me in 6 months.  Chrystie Nose, MD, [facility], FACP  [place]  [facility]  Medical Director of the Advanced Lipid Disorders &  Cardiovascular Risk Reduction Clinic Diplomate of the American Board of Clinical Lipidology Attending Cardiologist  Direct Dial: [phone]  Fax: [phone]  Website:  [email or web address] 03/05/2023, 8:31 AM

## 2023-03-09 ENCOUNTER — Telehealth: Payer: Self-pay | Admitting: Nurse Practitioner

## 2023-03-09 ENCOUNTER — Ambulatory Visit: Payer: PPO | Admitting: Podiatry

## 2023-03-09 NOTE — Telephone Encounter (Signed)
Copied from CRM [id number]. Topic: Medicare AWV >> Mar 09, 2023  2:05 PM Payton Doughty wrote: Reason for CRM: Called patient to schedule Medicare Annual Wellness Visit (AWV). Left message for patient to call back and schedule Medicare Annual Wellness Visit (AWV).  Last date of AWV: 07/26/2018  Please schedule an appointment at any time with Kennedy Bucker, LPN    If any questions, please contact me.  Thank you ,  Verlee Rossetti; Care Guide Ambulatory Clinical Support [place] [facility] Direct Dial: [phone]

## 2023-03-14 ENCOUNTER — Ambulatory Visit
Admission: RE | Admit: 2023-03-14 | Discharge: 2023-03-14 | Disposition: A | Discharge status: Home / Self Care | Payer: Self-pay | Source: Ambulatory Visit | Attending: Internal Medicine | Admitting: Internal Medicine

## 2023-03-14 DIAGNOSIS — E785 Hyperlipidemia, unspecified: Secondary | ICD-10-CM | POA: Insufficient documentation

## 2023-03-20 ENCOUNTER — Telehealth: Payer: Self-pay | Admitting: Internal Medicine

## 2023-03-20 NOTE — Telephone Encounter (Signed)
Patient is requesting a call back to discuss CT results. 

## 2023-03-20 NOTE — Telephone Encounter (Signed)
Patient is aware of CT results. Verbalized understanding

## 2023-03-26 ENCOUNTER — Telehealth: Payer: Self-pay | Admitting: Nurse Practitioner

## 2023-03-26 NOTE — Telephone Encounter (Signed)
Copied from CRM [id number]. Topic: Medicare AWV >> Mar 26, 2023 10:50 AM Payton Doughty wrote: Reason for CRM: Called patient to schedule Medicare Annual Wellness Visit (AWV). Left message for patient to call back and schedule Medicare Annual Wellness Visit (AWV).  Last date of AWV: 07/26/2018  Please schedule an appointment at any time with Kennedy Bucker, LPN  .  If any questions, please contact me.  Thank you ,  Verlee Rossetti; Care Guide Ambulatory Clinical Support [place] [facility] Direct Dial: [phone]

## 2023-04-09 DIAGNOSIS — H43813 Vitreous degeneration, bilateral: Secondary | ICD-10-CM | POA: Diagnosis not present

## 2023-04-09 DIAGNOSIS — Z961 Presence of intraocular lens: Secondary | ICD-10-CM | POA: Diagnosis not present

## 2023-04-09 DIAGNOSIS — H04123 Dry eye syndrome of bilateral lacrimal glands: Secondary | ICD-10-CM | POA: Diagnosis not present

## 2023-04-12 ENCOUNTER — Other Ambulatory Visit: Payer: Self-pay | Admitting: Internal Medicine

## 2023-04-12 DIAGNOSIS — Z8249 Family history of ischemic heart disease and other diseases of the circulatory system: Secondary | ICD-10-CM

## 2023-04-12 DIAGNOSIS — T466X5A Adverse effect of antihyperlipidemic and antiarteriosclerotic drugs, initial encounter: Secondary | ICD-10-CM

## 2023-04-12 DIAGNOSIS — E785 Hyperlipidemia, unspecified: Secondary | ICD-10-CM

## 2023-05-14 ENCOUNTER — Other Ambulatory Visit: Payer: Self-pay | Admitting: Nurse Practitioner

## 2023-05-15 NOTE — Telephone Encounter (Signed)
Patient called, left VM to return the call to the office to verify if Rx refill is needed for medication. If having symptoms she will need to speak to a nurse.

## 2023-05-15 NOTE — Telephone Encounter (Signed)
Requested medication (s) are due for refill today: Yes  Requested medication (s) are on the active medication list: Yes  Last refill:  12/17/20  Future visit scheduled: Yes  Notes to clinic:  Unsure if patient is requesting this refill since last refilled 2 years ago. Patient called, left message to callback. Routing to provider for resolution.      Requested Prescriptions  Pending Prescriptions Disp Refills   valACYclovir (VALTREX) 500 MG tablet [Pharmacy Med Name: VALACYCLOVIR HCL 500 MG TABLET] 30 tablet 0    Sig: Take 1 tablet (500 mg total) by mouth as needed.     Antimicrobials:  Antiviral Agents - Anti-Herpetic Passed - 05/14/2023 11:52 AM      Passed - Valid encounter within last 12 months    Recent Outpatient Visits           3 months ago Hypothyroidism, unspecified type   [facility] [place], Corrie Dandy T, NP   3 months ago Acute cystitis without hematuria   [place] [facility] Mecum, Oswaldo Conroy, PA-C   4 months ago Medicare annual wellness visit, subsequent   [facility] [facility] [place], [place] T, NP   7 months ago Depression, major, single episode, mild (HCC)   [place] [facility] [place], Corrie Dandy T, NP   1 year ago Hyperlipidemia, unspecified hyperlipidemia type   [place] [facility] [name], [name], MD       Future Appointments             In 1 month Cannady, Dorie Rank, NP [facility], [facility]   In 3 months Hilty, Lisette Abu, MD [facility]            CF

## 2023-06-28 ENCOUNTER — Ambulatory Visit: Payer: PPO | Admitting: Nurse Practitioner

## 2023-06-28 DIAGNOSIS — E039 Hypothyroidism, unspecified: Secondary | ICD-10-CM

## 2023-06-28 DIAGNOSIS — I10 Essential (primary) hypertension: Secondary | ICD-10-CM

## 2023-06-28 DIAGNOSIS — M791 Myalgia, unspecified site: Secondary | ICD-10-CM

## 2023-06-28 DIAGNOSIS — F320 Major depressive disorder, single episode, mild: Secondary | ICD-10-CM

## 2023-06-28 DIAGNOSIS — M797 Fibromyalgia: Secondary | ICD-10-CM

## 2023-06-28 DIAGNOSIS — E6609 Other obesity due to excess calories: Secondary | ICD-10-CM

## 2023-06-28 DIAGNOSIS — M810 Age-related osteoporosis without current pathological fracture: Secondary | ICD-10-CM

## 2023-06-28 DIAGNOSIS — E7800 Pure hypercholesterolemia, unspecified: Secondary | ICD-10-CM

## 2023-06-28 NOTE — Patient Instructions (Signed)
Wegovy or Zepbound  Focus on DASH diet for high blood pressure or Mediterranean diet  Be Involved in Caring For Your Health:  Taking Medications When medications are taken as directed, they can greatly improve your health. But if they are not taken as prescribed, they may not work. In some cases, not taking them correctly can be harmful. To help ensure your treatment remains effective and safe, understand your medications and how to take them. Bring your medications to each visit for review by your provider.  Your lab results, notes, and after visit summary will be available on My Chart. We strongly encourage you to use this feature. If lab results are abnormal the clinic will contact you with the appropriate steps. If the clinic does not contact you assume the results are satisfactory. You can always view your results on My Chart. If you have questions regarding your health or results, please contact the clinic during office hours. You can also ask questions on My Chart.  We at [facility] are grateful that you chose us to provide your care. We strive to provide evidence-based and compassionate care and are always looking for feedback. If you get a survey from the clinic please complete this so we can hear your opinions.  Preventing High Cholesterol Cholesterol is a white, waxy substance similar to fat that the human body needs to help build cells. The liver makes all the cholesterol that a person's body needs. Having high cholesterol (hypercholesterolemia) increases your risk for heart disease and stroke. Extra or excess cholesterol comes from the food that you eat. High cholesterol can often be prevented with diet and lifestyle changes. If you already have high cholesterol, you can control it with diet, lifestyle changes, and medicines. How can high cholesterol affect me? If you have high cholesterol, fatty deposits (plaques) may build up on the walls of your blood vessels. The blood  vessels that carry blood away from your heart are called arteries. Plaques make the arteries narrower and stiffer. This in turn can: Restrict or block blood flow and cause blood clots to form. Increase your risk for heart attack and stroke. What can increase my risk for high cholesterol? This condition is more likely to develop in people who: Eat foods that are high in saturated fat or cholesterol. Saturated fat is mostly found in foods that come from animal sources. Are overweight. Are not getting enough exercise. Use products that contain nicotine or tobacco, such as cigarettes, e-cigarettes, and chewing tobacco. Have a family history of high cholesterol (familial hypercholesterolemia). What actions can I take to prevent this? Nutrition  Eat less saturated fat. Avoid trans fats (partially hydrogenated oils). These are often found in margarine and in some baked goods, fried foods, and snacks bought in packages. Avoid precooked or cured meat, such as bacon, sausages, or meat loaves. Avoid foods and drinks that have added sugars. Eat more fruits, vegetables, and whole grains. Choose healthy sources of protein, such as fish, poultry, lean cuts of red meat, beans, peas, lentils, and nuts. Choose healthy sources of fat, such as: Nuts. Vegetable oils, especially olive oil. Fish that have healthy fats, such as omega-3 fatty acids. These fish include mackerel or salmon. Lifestyle Lose weight if you are overweight. Maintaining a healthy body mass index (BMI) can help prevent or control high cholesterol. It can also lower your risk for diabetes and high blood pressure. Ask your health care provider to help you with a diet and exercise plan to lose   weight safely. Do not use any products that contain nicotine or tobacco. These products include cigarettes, chewing tobacco, and vaping devices, such as e-cigarettes. If you need help quitting, ask your health care provider. Alcohol use Do not drink  alcohol if: Your health care provider tells you not to drink. You are pregnant, may be pregnant, or are planning to become pregnant. If you drink alcohol: Limit how much you have to: 0-1 drink a day for women. 0-2 drinks a day for men. Know how much alcohol is in your drink. In the [place] one drink equals one 12 oz bottle of beer (355 mL), one 5 oz glass of wine (148 mL), or one 1 oz glass of hard liquor (44 mL). Activity  Get enough exercise. Do exercises as told by your health care provider. Each week, do at least 150 minutes of exercise that takes a medium level of effort (moderate-intensity exercise). This kind of exercise: Makes your heart beat faster while allowing you to still be able to talk. Can be done in short sessions several times a day or longer sessions a few times a week. For example, on 5 days each week, you could walk fast or ride your bike 3 times a day for 10 minutes each time. Medicines Your health care provider may recommend medicines to help lower cholesterol. This may be a medicine to lower the amount of cholesterol that your liver makes. You may need medicine if: Diet and lifestyle changes have not lowered your cholesterol enough. You have high cholesterol and other risk factors for heart disease or stroke. Take over-the-counter and prescription medicines only as told by your health care provider. General information Manage your risk factors for high cholesterol. Talk with your health care provider about all your risk factors and how to lower your risk. Manage other conditions that you have, such as diabetes or high blood pressure (hypertension). Have blood tests to check your cholesterol levels at regular points in time as told by your health care provider. Keep all follow-up visits. This is important. Where to find more information American Heart Association: www.heart.org National Heart, Lung, and Blood Institute: www.nhlbi.nih.gov Summary High cholesterol  increases your risk for heart disease and stroke. By keeping your cholesterol level low, you can reduce your risk for these conditions. High cholesterol can often be prevented with diet and lifestyle changes. Work with your health care provider to manage your risk factors, and have your blood tested regularly. This information is not intended to replace advice given to you by your health care provider. Make sure you discuss any questions you have with your health care provider. Document Revised: 06/09/2022 Document Reviewed: 01/10/2021 Elsevier Patient Education  2024 Elsevier Inc.  

## 2023-06-29 ENCOUNTER — Ambulatory Visit (INDEPENDENT_AMBULATORY_CARE_PROVIDER_SITE_OTHER): Payer: PPO | Admitting: Nurse Practitioner

## 2023-06-29 ENCOUNTER — Encounter: Payer: Self-pay | Admitting: Nurse Practitioner

## 2023-06-29 VITALS — BP 115/75 | HR 76 | Temp 97.4°F | Ht 62.0 in | Wt 172.8 lb

## 2023-06-29 DIAGNOSIS — G500 Trigeminal neuralgia: Secondary | ICD-10-CM

## 2023-06-29 DIAGNOSIS — I10 Essential (primary) hypertension: Secondary | ICD-10-CM

## 2023-06-29 DIAGNOSIS — T466X5A Adverse effect of antihyperlipidemic and antiarteriosclerotic drugs, initial encounter: Secondary | ICD-10-CM

## 2023-06-29 DIAGNOSIS — M797 Fibromyalgia: Secondary | ICD-10-CM | POA: Diagnosis not present

## 2023-06-29 DIAGNOSIS — F320 Major depressive disorder, single episode, mild: Secondary | ICD-10-CM | POA: Diagnosis not present

## 2023-06-29 DIAGNOSIS — M810 Age-related osteoporosis without current pathological fracture: Secondary | ICD-10-CM | POA: Diagnosis not present

## 2023-06-29 DIAGNOSIS — E039 Hypothyroidism, unspecified: Secondary | ICD-10-CM

## 2023-06-29 DIAGNOSIS — Z6831 Body mass index (BMI) 31.0-31.9, adult: Secondary | ICD-10-CM

## 2023-06-29 DIAGNOSIS — E6609 Other obesity due to excess calories: Secondary | ICD-10-CM | POA: Diagnosis not present

## 2023-06-29 DIAGNOSIS — E7800 Pure hypercholesterolemia, unspecified: Secondary | ICD-10-CM | POA: Diagnosis not present

## 2023-06-29 NOTE — Assessment & Plan Note (Signed)
Chronic, ongoing, tolerating Fosamax.  Continue this and reviewed recent DEXA with her, noted some improvement in scores.  Repeat DEXA around 12/12/24.

## 2023-06-29 NOTE — Assessment & Plan Note (Signed)
Chronic, ongoing.  Followed by neurology, continue Gabapentin.  Recent note reviewed.

## 2023-06-29 NOTE — Assessment & Plan Note (Signed)
Refer to hyperlipidemia plan of care.

## 2023-06-29 NOTE — Assessment & Plan Note (Signed)
Chronic, ongoing.  Continue Levothyroxine as ordered and adjust if needed.  Labs today.

## 2023-06-29 NOTE — Assessment & Plan Note (Signed)
Chronic, stable on current regimen with PHQ9 = 5.  Denies SI/HI.  Continue current medication regimen and adjust as needed.  Return in 6 months, refills sent in as needed.

## 2023-06-29 NOTE — Progress Notes (Signed)
BP 115/75   Pulse 76   Temp (!) 97.4 F (36.3 C) (Oral)   Ht 5\' 2"  (1.575 m)   Wt 172 lb 12.8 oz (78.4 kg)   SpO2 98%   BMI 31.61 kg/m    Subjective:    Patient ID: Angela Martinez, female    DOB: 03/11/1951, 72 y.o.   MRN: [id number]  HPI: Angela Martinez is a 72 y.o. female  Chief Complaint  Patient presents with   Hyperlipidemia   Hypertension   Hypothyroidism   HYPERTENSION / HYPERLIPIDEMIA No blood pressure medications.  Has had poor tolerance to Crestor and Lipitor, including on 1 and 3 day a week schedules + could not tolerate Zetia had severe cramps with this. Saw lipid clinic on 03/05/23 and CT cardiac scoring performed, noted coronary calcium score 0.   Has fibromyalgia at baseline but statins and Zetia made pain worse. Continues on Gabapentin 300 MG TID for fibromyalgia and trigeminal neuralgia left side of face -- last saw neurology 03/06/22.  Recently had some increased neuralgia and is benefiting from increased Gabapentin dosing. Satisfied with current treatment? no Duration of hypertension: chronic BP monitoring frequency: not checking BP range:  BP medication side effects: no Duration of hyperlipidemia: chronic Cholesterol medication side effects: no Cholesterol supplements: none Medication compliance: good compliance Aspirin: no Recent stressors: no Recurrent headaches: no Visual changes: no Palpitations: no Dyspnea: no Chest pain: no Lower extremity edema: no, improved Dizzy/lightheaded: no  The 10-year ASCVD risk score (Arnett DK, et al., 2019) is: 12.1%   Values used to calculate the score:     Age: 43 years     Sex: Female     Is Non-Hispanic African American: No     Diabetic: No     Tobacco smoker: No     Systolic Blood Pressure: 115 mmHg     Is BP treated: No     HDL Cholesterol: 46 mg/dL     Total Cholesterol: 222 mg/dL    HYPOTHYROIDISM Taking Levothyroxine 25 MCG. Fatigue: sometimes Cold intolerance: no Heat intolerance: no Weight gain:  no Weight loss: no Constipation: yes Diarrhea/loose stools: improved Palpitations: no Lower extremity edema: no Anxiety/depressed mood: occasional  OSTEOPOROSIS: Taking Vitamin D every day.  DEXA 05/25/20 -- T-score of -2.8 and recent DEXA on 12/12/22 noted T-Score -2.5.  Taking Alendronate weekly. Recently fractured both 5th toes, these are healing. Satisfied with current treatment?: yes Medication side effects: no Medication compliance: good compliance Past osteoporosis medications/treatments:  Adequate calcium & vitamin D: yes Intolerance to bisphosphonates:no Weight bearing exercises: yes    DEPRESSION Continues on Prozac 20 MG daily. Mood status: stable Satisfied with current treatment?: yes Symptom severity: mild  Duration of current treatment : chronic Side effects: no Medication compliance: good compliance Psychotherapy/counseling: none Previous psychiatric medications: Prozac Depressed mood: no Anxious mood: no Anhedonia: no Significant weight loss or gain: no Insomnia: none Fatigue: no Feelings of worthlessness or guilt: no Impaired concentration/indecisiveness: no Suicidal ideations: no Hopelessness: no Crying spells: no    06/29/2023    9:29 AM 02/13/2023    9:10 AM 01/18/2023   11:00 AM 12/28/2022    8:45 AM 09/26/2022   10:15 AM  Depression screen PHQ 2/9  Decreased Interest 0 1 0 1 1  Down, Depressed, Hopeless 1 1 1 1 1   PHQ - 2 Score 1 2 1 2 2   Altered sleeping 1 1 0 1 2  Tired, decreased energy 1 1 1 1 1   Change  in appetite 1 0 0 2 2  Feeling bad or failure about yourself  0 0 0 1 1  Trouble concentrating 0 1 1 0 0  Moving slowly or fidgety/restless 1 0 0 0 1  Suicidal thoughts 0 0 0 0 0  PHQ-9 Score 5 5 3 7 9   Difficult doing work/chores Somewhat difficult Not difficult at all Not difficult at all Not difficult at all Not difficult at all       06/29/2023    9:30 AM 02/13/2023    9:10 AM 01/18/2023   11:01 AM 12/28/2022    8:45 AM  GAD 7 :  Generalized Anxiety Score  Nervous, Anxious, on Edge 1 0 1 1  Control/stop worrying 1 0 1 0  Worry too much - different things 1 1 0 0  Trouble relaxing 1 1 1 1   Restless 1 1 0 1  Easily annoyed or irritable 1 0 1 1  Afraid - awful might happen 1 1 0 0  Total GAD 7 Score 7 4 4 4   Anxiety Difficulty Somewhat difficult Not difficult at all Not difficult at all Not difficult at all    Relevant past medical, surgical, family and social history reviewed and updated as indicated. Interim medical history since our last visit reviewed. Allergies and medications reviewed and updated.  Review of Systems  Constitutional:  Negative for activity change, appetite change, diaphoresis, fatigue and fever.  Respiratory:  Negative for cough, chest tightness, shortness of breath and wheezing.   Cardiovascular:  Negative for chest pain, palpitations and leg swelling.  Gastrointestinal: Negative.   Endocrine: Negative for cold intolerance, heat intolerance, polydipsia, polyphagia and polyuria.  Musculoskeletal:  Positive for arthralgias.  Neurological: Negative.   Psychiatric/Behavioral: Negative.      Per HPI unless specifically indicated above     Objective:    BP 115/75   Pulse 76   Temp (!) 97.4 F (36.3 C) (Oral)   Ht 5\' 2"  (1.575 m)   Wt 172 lb 12.8 oz (78.4 kg)   SpO2 98%   BMI 31.61 kg/m   Wt Readings from Last 3 Encounters:  06/29/23 172 lb 12.8 oz (78.4 kg)  03/05/23 178 lb 6.4 oz (80.9 kg)  02/13/23 176 lb 8 oz (80.1 kg)    Physical Exam Vitals and nursing note reviewed.  Constitutional:      General: She is awake. She is not in acute distress.    Appearance: She is well-developed, well-groomed and overweight. She is not ill-appearing.  HENT:     Head: Normocephalic.     Right Ear: Hearing normal.     Left Ear: Hearing normal.  Eyes:     General: Lids are normal.        Right eye: No discharge.        Left eye: No discharge.     Conjunctiva/sclera: Conjunctivae normal.      Pupils: Pupils are equal, round, and reactive to light.  Neck:     Thyroid: No thyromegaly.     Vascular: No carotid bruit.  Cardiovascular:     Rate and Rhythm: Normal rate and regular rhythm.     Heart sounds: Normal heart sounds. No murmur heard.    No gallop.  Pulmonary:     Effort: Pulmonary effort is normal. No accessory muscle usage or respiratory distress.     Breath sounds: Normal breath sounds.  Abdominal:     General: Bowel sounds are normal. There is no distension.  Palpations: Abdomen is soft.     Tenderness: There is no abdominal tenderness.  Musculoskeletal:     Cervical back: Normal range of motion and neck supple.     Right lower leg: No edema.     Left lower leg: No edema.  Skin:    General: Skin is warm and dry.  Neurological:     Mental Status: She is alert and oriented to person, place, and time.  Psychiatric:        Attention and Perception: Attention normal.        Mood and Affect: Mood normal.        Speech: Speech normal.        Behavior: Behavior normal. Behavior is cooperative.        Thought Content: Thought content normal.    Results for orders placed or performed in visit on 02/13/23  T4, free  Result Value Ref Range   Free T4 1.12 0.82 - 1.77 ng/dL  TSH  Result Value Ref Range   TSH 3.450 0.450 - 4.500 uIU/mL      Assessment & Plan:   Problem List Items Addressed This Visit       Cardiovascular and Mediastinum   Essential hypertension (Chronic)    Chronic, stable. Currently no medications.  BP at goal in office for age.  Continue current diet control and adjust plan as needed based on readings.  Continue to check BP at home regularly and document.  Initiate medication as needed.  Focus on DASH diet.  Labs today: up to date.  Return in 6 months.      Relevant Orders   Comprehensive metabolic panel     Endocrine   Hypothyroidism (Chronic)    Chronic, ongoing.  Continue Levothyroxine as ordered and adjust if needed.  Labs  today.      Relevant Orders   TSH   T4, free     Nervous and Auditory   Trigeminal neuralgia of left side of face (Chronic)    Chronic, ongoing.  Followed by neurology, continue Gabapentin.  Recent note reviewed.        Musculoskeletal and Integument   Osteoporosis (Chronic)    Chronic, ongoing, tolerating Fosamax.  Continue this and reviewed recent DEXA with her, noted some improvement in scores.  Repeat DEXA around 12/12/24.        Other   Depression, major, single episode, mild (HCC) - Primary (Chronic)    Chronic, stable on current regimen with PHQ9 = 5.  Denies SI/HI.  Continue current medication regimen and adjust as needed.  Return in 6 months, refills sent in as needed.      Fibromyalgia (Chronic)    Chronic, stable on Gabapentin.  Continue current medication and adjust as needed.  Return in 6 months.  Monitor kidney function and renal dose Gabapentin as needed.      Hyperlipidemia (Chronic)    Chronic, poor tolerance to statins.  Had trial of Lipitor, Zetia, and Crestor with immediate muscle cramps.  Did not tolerate Zetia, all over muscle pain with this. Saw lipid clinic and CT coronary calcium scoring was 0.  They did order future labs, will obtain these today for them.      Relevant Orders   NMR, lipoprofile   Lipoprotein A (LPA)   Comprehensive metabolic panel   Myalgia due to statin    Refer to hyperlipidemia plan of care.      Obesity    BMI 31.61.  Recommended eating smaller high protein,  low fat meals more frequently and exercising 30 mins a day 5 times a week with a goal of 10-15lb weight loss in the next 3 months. Patient voiced their understanding and motivation to adhere to these recommendations.         Follow up plan: Return in about 6 months (around 12/30/2023) for Annual physical after 12/29/23.

## 2023-06-29 NOTE — Assessment & Plan Note (Signed)
Chronic, poor tolerance to statins.  Had trial of Lipitor, Zetia, and Crestor with immediate muscle cramps.  Did not tolerate Zetia, all over muscle pain with this. Saw lipid clinic and CT coronary calcium scoring was 0.  They did order future labs, will obtain these today for them.

## 2023-06-29 NOTE — Assessment & Plan Note (Signed)
Chronic, stable on Gabapentin.  Continue current medication and adjust as needed.  Return in 6 months.  Monitor kidney function and renal dose Gabapentin as needed. 

## 2023-06-29 NOTE — Assessment & Plan Note (Signed)
BMI 31.61.  Recommended eating smaller high protein, low fat meals more frequently and exercising 30 mins a day 5 times a week with a goal of 10-15lb weight loss in the next 3 months. Patient voiced their understanding and motivation to adhere to these recommendations.

## 2023-06-29 NOTE — Assessment & Plan Note (Signed)
Chronic, stable. Currently no medications.  BP at goal in office for age.  Continue current diet control and adjust plan as needed based on readings.  Continue to check BP at home regularly and document.  Initiate medication as needed.  Focus on DASH diet.  Labs today: up to date.  Return in 6 months.

## 2023-06-30 ENCOUNTER — Other Ambulatory Visit: Payer: Self-pay | Admitting: Nurse Practitioner

## 2023-06-30 DIAGNOSIS — E039 Hypothyroidism, unspecified: Secondary | ICD-10-CM

## 2023-06-30 DIAGNOSIS — N179 Acute kidney failure, unspecified: Secondary | ICD-10-CM

## 2023-06-30 NOTE — Progress Notes (Signed)
Contacted via MyChart -- need lab only visit in 4 weeks please   Good morning Angela Martinez, your labs have returned: - Kidney function is showing a little decline this check from your baseline.  I suspect you were quite dehydrated at visit, especially with your symptoms upon arrival.  Please increase water intake over next weeks and I would like to recheck on outpatient labs. - TSH is a little elevated, sluggish thyroid, but free T4 normal.  I would like to recheck this on outpatient labs in 4 weeks, if remains elevated on TSH then we will adjust your Levothyroxine.   - Waiting on cholesterol labs to return.  Any questions? Keep being stellar!!  Thank you for allowing me to participate in your care.  I appreciate you. Kindest regards, Caydee Talkington

## 2023-07-03 NOTE — Progress Notes (Signed)
Called and scheduled patient for labs on 07/31/2023 @ 8:20 am.

## 2023-07-09 ENCOUNTER — Other Ambulatory Visit: Payer: Self-pay | Admitting: Nurse Practitioner

## 2023-07-09 DIAGNOSIS — K219 Gastro-esophageal reflux disease without esophagitis: Secondary | ICD-10-CM

## 2023-07-11 ENCOUNTER — Telehealth: Payer: Self-pay | Admitting: Nurse Practitioner

## 2023-07-11 NOTE — Telephone Encounter (Signed)
Pt called into the [facility] but when I got on the phone with Barbara Cower, she hung up.  He was concerned as she told him that since she had her physical that she hasn't been right since.  She told him that she was very weak and can't walk.  Wanted provider to give her a call.  Please advise.

## 2023-07-11 NOTE — Telephone Encounter (Signed)
Called and LVM asking for patient to please return my call.   OK for [facility] nurse triage to speak with the patient and find out more information as to what is going on if she calls back.

## 2023-07-11 NOTE — Telephone Encounter (Signed)
Requested Prescriptions  Pending Prescriptions Disp Refills   omeprazole (PRILOSEC) 20 MG capsule [Pharmacy Med Name: OMEPRAZOLE DR 20 MG CAPSULE] 60 capsule 0    Sig: Take 1 capsule (20 mg total) by mouth daily. NEEDS APPOINTMENT FOR FURTHER REFILLS     Gastroenterology: Proton Pump Inhibitors Passed - 07/09/2023  1:53 PM      Passed - Valid encounter within last 12 months    Recent Outpatient Visits           1 week ago Depression, major, single episode, mild (HCC)   [place] [facility] Goodman, [place] T, NP   4 months ago Hypothyroidism, unspecified type   [place] [facility] [place], Corrie Dandy T, NP   5 months ago Acute cystitis without hematuria   [place] [facility] Mecum, Oswaldo Conroy, PA-C   6 months ago Medicare annual wellness visit, subsequent   [place] [facility] [place], [place] T, NP   9 months ago Depression, major, single episode, mild [facility])   [place] [facility] [place], Dorie Rank, NP       Future Appointments             In 1 month Hilty, Lisette Abu, MD [place] [facility]   In 5 months [place], Dorie Rank, NP  [facility], [facility]

## 2023-07-12 DIAGNOSIS — M1711 Unilateral primary osteoarthritis, right knee: Secondary | ICD-10-CM | POA: Diagnosis not present

## 2023-07-25 NOTE — Telephone Encounter (Signed)
Left message for patient to call back to discuss labs and recommendations from MD

## 2023-07-31 ENCOUNTER — Other Ambulatory Visit: Payer: PPO

## 2023-07-31 DIAGNOSIS — N179 Acute kidney failure, unspecified: Secondary | ICD-10-CM

## 2023-07-31 DIAGNOSIS — E039 Hypothyroidism, unspecified: Secondary | ICD-10-CM

## 2023-08-01 ENCOUNTER — Other Ambulatory Visit: Payer: Self-pay | Admitting: Nurse Practitioner

## 2023-08-01 DIAGNOSIS — E039 Hypothyroidism, unspecified: Secondary | ICD-10-CM

## 2023-08-01 LAB — BASIC METABOLIC PANEL
BUN/Creatinine Ratio: 18 (ref 12–28)
BUN: 18 mg/dL (ref 8–27)
CO2: 23 mmol/L (ref 20–29)
Calcium: 9.4 mg/dL (ref 8.7–10.3)
Chloride: 106 mmol/L (ref 96–106)
Creatinine, Ser: 1 mg/dL (ref 0.57–1.00)
Glucose: 93 mg/dL (ref 70–99)
Potassium: 4.6 mmol/L (ref 3.5–5.2)
Sodium: 142 mmol/L (ref 134–144)
eGFR: 59 mL/min/{1.73_m2} — ABNORMAL LOW (ref >59)

## 2023-08-01 LAB — TSH: TSH: 5.46 u[IU]/mL — ABNORMAL HIGH (ref 0.450–4.500)

## 2023-08-01 LAB — T4, FREE: Free T4: 1.13 ng/dL (ref 0.82–1.77)

## 2023-08-01 MED ORDER — LEVOTHYROXINE SODIUM 50 MCG PO TABS: 50.0000 ug | ORAL_TABLET | Freq: Every day | ORAL | 3 refills | Status: DC

## 2023-08-01 NOTE — Progress Notes (Signed)
Contacted via MyChart -- please schedule lab only visit for 6 weeks   Good afternoon Angela Martinez, your labs have returned: - Kidney function is improving some, continue to drink plenty of fluid daily. - TSH has trended up, Free T4 remains normal.  I would like to increase your Levothyroxine to 50 MCG daily, stop the 25 MCG dosing.  Then we will recheck labs outpatient in 6 weeks.  Any questions? Keep being amazing!!  Thank you for allowing me to participate in your care.  I appreciate you. Kindest regards, [name]

## 2023-08-03 NOTE — Progress Notes (Signed)
Attempted to reach patient, phone just rang. Put in CRM.

## 2023-08-06 NOTE — Telephone Encounter (Signed)
Left message for patient to call back OR respond to MyChart message regarding labs/recommendations from [facility] MD  Patient has appointment 08/22/23

## 2023-08-14 DIAGNOSIS — M545 Low back pain, unspecified: Secondary | ICD-10-CM | POA: Diagnosis not present

## 2023-08-14 DIAGNOSIS — R350 Frequency of micturition: Secondary | ICD-10-CM | POA: Diagnosis not present

## 2023-08-15 NOTE — Discharge Instructions (Signed)

## 2023-08-16 ENCOUNTER — Encounter: Payer: Self-pay | Admitting: *Deleted

## 2023-08-16 ENCOUNTER — Emergency Department
Admission: EM | Admit: 2023-08-16 | Discharge: 2023-08-16 | Disposition: A | Discharge status: Home / Self Care | Payer: PPO | Attending: Emergency Medicine | Admitting: Emergency Medicine

## 2023-08-16 ENCOUNTER — Emergency Department: Payer: PPO

## 2023-08-16 ENCOUNTER — Other Ambulatory Visit: Payer: Self-pay

## 2023-08-16 DIAGNOSIS — I700 Atherosclerosis of aorta: Secondary | ICD-10-CM | POA: Diagnosis not present

## 2023-08-16 DIAGNOSIS — K449 Diaphragmatic hernia without obstruction or gangrene: Secondary | ICD-10-CM | POA: Diagnosis not present

## 2023-08-16 DIAGNOSIS — M545 Low back pain, unspecified: Secondary | ICD-10-CM | POA: Insufficient documentation

## 2023-08-16 DIAGNOSIS — I10 Essential (primary) hypertension: Secondary | ICD-10-CM | POA: Diagnosis not present

## 2023-08-16 DIAGNOSIS — S32010A Wedge compression fracture of first lumbar vertebra, initial encounter for closed fracture: Secondary | ICD-10-CM

## 2023-08-16 DIAGNOSIS — Z981 Arthrodesis status: Secondary | ICD-10-CM | POA: Diagnosis not present

## 2023-08-16 LAB — COMPREHENSIVE METABOLIC PANEL
ALT: 16 U/L (ref 0–44)
AST: 18 U/L (ref 15–41)
Albumin: 3.8 g/dL (ref 3.5–5.0)
Alkaline Phosphatase: 41 U/L (ref 38–126)
Anion gap: 13 (ref 5–15)
BUN: 24 mg/dL — ABNORMAL HIGH (ref 8–23)
CO2: 26 mmol/L (ref 22–32)
Calcium: 9.4 mg/dL (ref 8.9–10.3)
Chloride: 100 mmol/L (ref 98–111)
Creatinine, Ser: 0.97 mg/dL (ref 0.44–1.00)
GFR, Estimated: >60 mL/min (ref >60)
Glucose, Bld: 90 mg/dL (ref 70–99)
Potassium: 4.1 mmol/L (ref 3.5–5.1)
Sodium: 139 mmol/L (ref 135–145)
Total Bilirubin: 0.9 mg/dL (ref 0.3–1.2)
Total Protein: 7.4 g/dL (ref 6.5–8.1)

## 2023-08-16 LAB — LIPASE, BLOOD: Lipase: 21 U/L (ref 11–51)

## 2023-08-16 MED ORDER — KETOROLAC TROMETHAMINE 30 MG/ML IJ SOLN
15.0000 mg | Freq: Once | INTRAMUSCULAR | Status: AC
  Administered 2023-08-16: 15 mg via INTRAVENOUS
  Filled 2023-08-16: qty 1

## 2023-08-16 MED ORDER — ONDANSETRON HCL 4 MG/2ML IJ SOLN
4.0000 mg | Freq: Once | INTRAMUSCULAR | Status: AC
  Administered 2023-08-16: 4 mg via INTRAVENOUS
  Filled 2023-08-16: qty 2

## 2023-08-16 MED ORDER — DOCUSATE SODIUM 100 MG PO CAPS: 100.0000 mg | ORAL_CAPSULE | Freq: Two times a day (BID) | ORAL | 0 refills | Status: AC

## 2023-08-16 MED ORDER — ONDANSETRON 4 MG PO TBDP: 4.0000 mg | ORAL_TABLET | Freq: Three times a day (TID) | ORAL | 0 refills | Status: DC | PRN

## 2023-08-16 MED ORDER — ACETAMINOPHEN 500 MG PO TABS
1000.0000 mg | ORAL_TABLET | Freq: Once | ORAL | Status: AC
  Administered 2023-08-16: 1000 mg via ORAL
  Filled 2023-08-16: qty 2

## 2023-08-16 MED ORDER — SODIUM CHLORIDE 0.9 % IV BOLUS
500.0000 mL | Freq: Once | INTRAVENOUS | Status: AC
  Administered 2023-08-16: 500 mL via INTRAVENOUS

## 2023-08-16 MED ORDER — OXYCODONE HCL 5 MG PO TABS
5.0000 mg | ORAL_TABLET | Freq: Four times a day (QID) | ORAL | 0 refills | Status: AC | PRN
Start: 2023-08-16 — End: 2024-08-15

## 2023-08-16 MED ORDER — MORPHINE SULFATE (PF) 4 MG/ML IV SOLN
4.0000 mg | Freq: Once | INTRAVENOUS | Status: AC
  Administered 2023-08-16: 4 mg via INTRAVENOUS
  Filled 2023-08-16: qty 1

## 2023-08-16 MED ORDER — IBUPROFEN 400 MG PO TABS: 400.0000 mg | ORAL_TABLET | Freq: Once | ORAL | Status: DC

## 2023-08-16 MED ORDER — NAPROXEN 375 MG PO TABS
375.0000 mg | ORAL_TABLET | Freq: Two times a day (BID) | ORAL | 0 refills | Status: AC
Start: 2023-08-16 — End: 2023-08-23

## 2023-08-16 MED ORDER — OXYCODONE HCL 5 MG PO TABS: 5.0000 mg | ORAL_TABLET | Freq: Once | ORAL | Status: DC

## 2023-08-16 MED ORDER — IOHEXOL 350 MG/ML SOLN
100.0000 mL | Freq: Once | INTRAVENOUS | Status: AC | PRN
  Administered 2023-08-16: 100 mL via INTRAVENOUS

## 2023-08-16 NOTE — ED Provider Notes (Signed)
[facility] Provider Note    Event Date/Time   First MD Initiated Contact with Patient 08/16/23 1139     (approximate)   History   Chief Complaint: Back Pain   HPI  Angela Martinez is a 72 y.o. female with a history of hypertension, fibromyalgia, lumbar fusion who comes ED complaining of low back pain for the past 4 days.  Constant, worse with movement.  Went to [facility], was given prednisone and tizanidine without improvement.  No fever, no dysuria frequency urgency.  No vomiting or diarrhea or belly pain.     Physical Exam   Triage Vital Signs: ED Triage Vitals  Encounter Vitals Group     BP 08/16/23 1035 (!) 127/94     Systolic BP Percentile --      Diastolic BP Percentile --      Pulse Rate 08/16/23 1035 78     Resp 08/16/23 1035 16     Temp 08/16/23 1035 98.5 F (36.9 C)     Temp Source 08/16/23 1035 Oral     SpO2 08/16/23 1035 97 %     Weight 08/16/23 1153 171 lb 15.3 oz (78 kg)     Height 08/16/23 1153 5\' 2"  (1.575 m)     Head Circumference --      Peak Flow --      Pain Score 08/16/23 1031 9     Pain Loc --      Pain Education --      Exclude from Growth Chart --     Most recent vital signs: Vitals:   08/16/23 1035 08/16/23 1036  BP: (!) 127/94 112/74  Pulse: 78 75  Resp: 16 16  Temp: 98.5 F (36.9 C) 98.7 F (37.1 C)  SpO2: 97% 100%    General: Awake, no distress.  CV:  Good peripheral perfusion.  Normal distal pulses Resp:  Normal effort.  Abd:  No distention.  Soft, mild epigastric tenderness Other:  No midline spinal tenderness   ED Results / Procedures / Treatments   Labs (all labs ordered are listed, but only abnormal results are displayed) Labs Reviewed  COMPREHENSIVE METABOLIC PANEL - Abnormal; Notable for the following components:      Result Value   BUN 24 (*)    All other components within normal limits  LIPASE, BLOOD  LACTIC ACID, PLASMA  CBC WITH DIFFERENTIAL/PLATELET      EKG    RADIOLOGY CT angio abdomen pelvis and L-spine pending   PROCEDURES:  Procedures   MEDICATIONS ORDERED IN ED: Medications  morphine (PF) 4 MG/ML injection 4 mg (4 mg Intravenous Given 08/16/23 1339)  ondansetron (ZOFRAN) injection 4 mg (4 mg Intravenous Given 08/16/23 1340)  iohexol (OMNIPAQUE) 350 MG/ML injection 100 mL (100 mLs Intravenous Contrast Given 08/16/23 1444)     IMPRESSION / MDM / ASSESSMENT AND PLAN / ED COURSE  I reviewed the triage vital signs and the nursing notes.  DDx: Herniated lumbar disc, lumbar compression fracture, abdominal aortic aneurysm, mesenteric ischemia, pancreatitis  Patient's presentation is most consistent with acute presentation with potential threat to life or bodily function.  Patient presents with severe low back pain, mild epigastric tenderness on exam as well.  Not responding to conservative measures.  Patient given IV morphine in the ED for pain relief.  Serum labs unremarkable.  Awaiting CT abdomen pelvis.       FINAL CLINICAL IMPRESSION(S) / ED DIAGNOSES   Final diagnoses:  Acute bilateral low back  pain without sciatica     Rx / DC Orders   ED Discharge Orders     None        Note:  This document was prepared using Dragon voice recognition software and may include unintentional dictation errors.   Sharman Cheek, MD 08/16/23 [phone]

## 2023-08-16 NOTE — Discharge Instructions (Signed)
Wear your brace when upright, and for comfort as needed.  I'd recommend the following pain regimen:  STOP the prednisone and tizanidine START -- Tylenol 1000 mg every 6-8 hours for 5-7 days, then as needed (scheduled first, then as needed) -- Naproxen 375 mg with food twice a day for 7 days -- Oxycodone 2.5 mg (half tablet) or 5 mg (full tablet) every 4-6 hours for moderate or severe pain  Take the stool softener and nausea medications with the pain medication  Follow-up with Neurosurgery in 1-2 weeks

## 2023-08-16 NOTE — Progress Notes (Signed)
Orthopedic Tech Progress Note Patient Details:  Angela Martinez [birth year] [id number]  Patient ID: Angela Martinez, female   DOB: 21-Jul-1951, 72 y.o.   MRN: [id number] TLSO called in to [facility]   Mako Pelfrey OTR/L 08/16/2023, 5:28 PM

## 2023-08-16 NOTE — ED Provider Notes (Signed)
Care assumed at the p.m.  Briefly, the patient is a 72 year old female with history of fibromyalgia, lumbar fusion, here with lower back pain for 4 days.  Worse with movement.  She has been on steroids and tizanidine without significant movement.  She was sent here for imaging from her PCP.  No lower extremity weakness or numbness.  No signs of cauda equina.  No fevers or chills.  Imaging reviewed, shows acute L1 compression fx which I suspect is etiology for her pain. Discussed with Dr. Katrinka Blazing of NSGY. Pt placed in TLSO, and pain is improved with analgesia here. Will place on pain regimen, refer for outpt f/u. Pt offered admission for pain control but she feels comfortable with this plan. Return precautions given. No LE weakness or signs of cord compromise/neuro injury.   Shaune Pollack, MD 08/17/23 [phone]

## 2023-08-16 NOTE — ED Triage Notes (Signed)
Pt was seen at [facility] on Tuesday due to lower back pain which began Sunday.  She was dx with a muscle strain and placed on prednisone and tizanidine.  Pain has gotten worse and she was told to come to the ED.  No urinary symptoms with this.

## 2023-08-22 ENCOUNTER — Ambulatory Visit: Payer: PPO | Admitting: Internal Medicine

## 2023-08-22 ENCOUNTER — Ambulatory Visit: Payer: Self-pay

## 2023-08-22 ENCOUNTER — Other Ambulatory Visit: Payer: Self-pay | Admitting: Nurse Practitioner

## 2023-08-22 MED ORDER — OXYCODONE HCL 5 MG PO TABS: 5.0000 mg | ORAL_TABLET | Freq: Four times a day (QID) | ORAL | 0 refills | Status: DC | PRN

## 2023-08-22 NOTE — Telephone Encounter (Signed)
Medication Refill - Medication:  oxyCODONE (ROXICODONE) 5 MG immediate release tablet   Has the patient contacted their pharmacy? yes (Agent: If yes, when and what did the pharmacy advise?)contact pcp  Preferred Pharmacy (with phone number or street name): [facility] - [place], [place] - [address] Phone: [phone]  Fax: [phone]    Has the patient been seen for an appointment in the last year OR does the patient have an upcoming appointment? yes  Agent: Please be advised that RX refills may take up to 3 business days. We ask that you follow-up with your pharmacy.

## 2023-08-22 NOTE — Telephone Encounter (Signed)
Chief Complaint: Back Pain Symptoms: severe back pain 10/10 plus, weakness Frequency: constant  Pertinent Negatives: Patient denies all other symptoms Disposition: [] ED /[] Urgent Care (no appt availability in office) / [x] Appointment(In office/virtual)/ []  [facility]/ [] Home Care/ [] Refused Recommended Disposition /[] [facility]/ []  Follow-up with PCP Additional Notes: Patient states she has had severe back pain since Tuesday last week. She was seen at [facility] urgent care was given pain medicine. Patient reported continued pain and went to the ED on Thursday and was told she has a compression fracture in L1 area of the back. Patient stated she has been in severe pain from her ribs down to her hips and she can't stand the pain. Patient has an appointment with Neuro but they stated they do not handle pain management and advised her to call PCP office.  Patient requesting pain medication refill today to be sent to [facility]. Care advice was given and appointment scheduled tomorrow at 1000. Patient stated if pain gets worse she will go back to the ED. Reason for Disposition  [1] SEVERE back pain (e.g., excruciating, unable to do any normal activities) AND [2] not improved 2 hours after pain medicine  Answer Assessment - Initial Assessment Questions 1. ONSET: "When did the pain begin?"      Last Wednesday  2. LOCATION: "Where does it hurt?" (upper, mid or lower back)     Lower back  3. SEVERITY: "How bad is the pain?"  (e.g., Scale 1-10; mild, moderate, or severe)   - MILD (1-3): Doesn't interfere with normal activities.    - MODERATE (4-7): Interferes with normal activities or awakens from sleep.    - SEVERE (8-10): Excruciating pain, unable to do any normal activities.      10/10 plus  4. PATTERN: "Is the pain constant?" (e.g., yes, no; constant, intermittent)      Constant  5. RADIATION: "Does the pain shoot into your legs or somewhere else?"     Down to the hips   6. CAUSE:  "What do you think is causing the back pain?"      Compression fracture in her back L1 7. BACK OVERUSE:  "Any recent lifting of heavy objects, strenuous work or exercise?"     No 8. MEDICINES: "What have you taken so far for the pain?" (e.g., nothing, acetaminophen, NSAIDS)     1 oxycodone today and muscle relaxer  9. NEUROLOGIC SYMPTOMS: "Do you have any weakness, numbness, or problems with bowel/bladder control?"     I am very weak  10. OTHER SYMPTOMS: "Do you have any other symptoms?" (e.g., fever, abdomen pain, burning with urination, blood in urine)       No  Protocols used: Back Pain-A-AH

## 2023-08-22 NOTE — Addendum Note (Signed)
Addended by: Aura Dials T on: 08/22/2023 12:47 PM   Modules accepted: Orders

## 2023-08-23 ENCOUNTER — Inpatient Hospital Stay
Admission: RE | Admit: 2023-08-23 | Discharge: 2023-08-23 | Disposition: A | Discharge status: Home / Self Care | Payer: Self-pay | Source: Ambulatory Visit | Attending: Neurosurgery | Admitting: Neurosurgery

## 2023-08-23 ENCOUNTER — Ambulatory Visit (INDEPENDENT_AMBULATORY_CARE_PROVIDER_SITE_OTHER): Payer: PPO | Admitting: Physician Assistant

## 2023-08-23 ENCOUNTER — Other Ambulatory Visit: Payer: Self-pay | Admitting: Family Medicine

## 2023-08-23 ENCOUNTER — Encounter: Payer: Self-pay | Admitting: Physician Assistant

## 2023-08-23 VITALS — BP 132/80 | HR 80 | Temp 97.9°F | Wt 172.4 lb

## 2023-08-23 DIAGNOSIS — L292 Pruritus vulvae: Secondary | ICD-10-CM

## 2023-08-23 DIAGNOSIS — M4856XS Collapsed vertebra, not elsewhere classified, lumbar region, sequela of fracture: Secondary | ICD-10-CM | POA: Insufficient documentation

## 2023-08-23 DIAGNOSIS — N390 Urinary tract infection, site not specified: Secondary | ICD-10-CM

## 2023-08-23 DIAGNOSIS — M545 Low back pain, unspecified: Secondary | ICD-10-CM

## 2023-08-23 DIAGNOSIS — Z049 Encounter for examination and observation for unspecified reason: Secondary | ICD-10-CM

## 2023-08-23 DIAGNOSIS — Z8781 Personal history of (healed) traumatic fracture: Secondary | ICD-10-CM | POA: Insufficient documentation

## 2023-08-23 LAB — MICROSCOPIC EXAMINATION

## 2023-08-23 LAB — WET PREP FOR TRICH, YEAST, CLUE
Clue Cell Exam: NEGATIVE
Trichomonas Exam: NEGATIVE
Yeast Exam: NEGATIVE

## 2023-08-23 LAB — URINALYSIS, ROUTINE W REFLEX MICROSCOPIC
Bilirubin, UA: NEGATIVE
Glucose, UA: NEGATIVE
Ketones, UA: NEGATIVE
Nitrite, UA: POSITIVE — AB
Specific Gravity, UA: 1.02 (ref 1.005–1.030)
Urobilinogen, Ur: 1 mg/dL (ref 0.2–1.0)
pH, UA: 7.5 (ref 5.0–7.5)

## 2023-08-23 MED ORDER — NITROFURANTOIN MONOHYD MACRO 100 MG PO CAPS
100.0000 mg | ORAL_CAPSULE | Freq: Two times a day (BID) | ORAL | 0 refills | Status: AC
Start: 2023-08-23 — End: 2023-08-28

## 2023-08-23 MED ORDER — NAPROXEN 375 MG PO TABS: 375.0000 mg | ORAL_TABLET | Freq: Two times a day (BID) | ORAL | 0 refills | Status: DC

## 2023-08-23 NOTE — Patient Instructions (Signed)
You can  try to use Miralax daily to retrain your bowels until you are having regular bowel movements. Take the recommended dose once daily then gradually reduce the dose until you are having regular daily bowel movements that are comfortable   I recommend increasing your daily fiber intake- do this gradually to prevent bloating and abdominal discomfort. Try to get about 5 grams per day the first week or so then increase by 5 grams each week until you feel like you are having comfortable bowel movements

## 2023-08-23 NOTE — Telephone Encounter (Signed)
Requested medication (s) are due for refill today:   Provider to review  Requested medication (s) are on the active medication list:   Yes  Future visit scheduled:   Yes today (10/3) with Erin at 10:00   Last ordered: 08/22/2023 #15, 0 refills  Non delegated refill.   This was prescribed by a provider from the ED.     Requested Prescriptions  Pending Prescriptions Disp Refills   oxyCODONE (ROXICODONE) 5 MG immediate release tablet 15 tablet 0    Sig: Take 1 tablet (5 mg total) by mouth every 6 (six) hours as needed for severe pain or moderate pain (Take one half (moderate) or one full (severe) tablet for pain.).     Not Delegated - Analgesics:  Opioid Agonists Failed - 08/22/2023 10:25 AM      Failed - This refill cannot be delegated      Failed - Urine Drug Screen completed in last 360 days      Passed - Valid encounter within last 3 months    Recent Outpatient Visits           1 month ago Depression, major, single episode, mild (HCC)   [place] [facility] [place], [place] T, NP   6 months ago Hypothyroidism, unspecified type   [place] [facility] [place], Corrie Dandy T, NP   7 months ago Acute cystitis without hematuria   [place] [facility] Mecum, Oswaldo Conroy, PA-C   7 months ago Medicare annual wellness visit, subsequent   [place] [facility] [place], [place] T, NP   11 months ago Depression, major, single episode, mild (HCC)   [place] [facility] [place], Dorie Rank, NP       Future Appointments             Today Mecum, Oswaldo Conroy, PA-C [facility], [facility]   In 4 months [place], Dorie Rank, NP [facility], [facility]

## 2023-08-23 NOTE — Progress Notes (Signed)
Acute Office Visit   Patient: Angela Martinez   DOB: [birth year]   72 y.o. Female  MRN: [id number] Visit Date: 08/23/2023  Today's healthcare provider: Oswaldo Conroy Nicolai Labonte, PA-C  Introduced myself to the patient as a Secondary school teacher and provided education on APPs in clinical practice.    Chief Complaint  Patient presents with   Back Pain    Pt states she has been dealing with the pain for 2 weeks has been seen in the emergency room has not got any relief     Subjective    HPI HPI     Back Pain    Additional comments: Pt states she has been dealing with the pain for 2 weeks has been seen in the emergency room has not got any relief        Last edited by Andre Lefort, CMA on 08/23/2023  9:57 AM.      Back pain  Reviewed ED visit notes and imaging results She was dx with L1 compression fracture   Onset: sudden  Duration: 2 weeks  Location: lower back - midline  Radiation: none  Pain level and character: 10/10  Other associated symptoms:none  Interventions: she has been to ED, she is taking Oxycodone and tizanidine  Alleviating: laying on her left side helps but this is limited due to left hip pain  Aggravating: getting up from seated position  She reports she is unable to bend much   She has a hx of L4-L5 surgery in 2012      IMPRESSION: CTA ABD/PELVIS   1. No evidence for acute mesenteric ischemia. 2. Mild scattered atherosclerotic plaque without significant stenosis or occlusion. Aortic Atherosclerosis (ICD10-I70.0). 3. Small volume air in the bladder is presumed related to recent catheterization. 4. Small hiatal hernia.   CT LUMBAR SPINE   1. Acute compression fracture of the superior endplate of L1 with approximately 30% height loss and mild posterior retropulsion. 2. Surgical changes of prior L4-L5 PLIF without evidence of acute complication. 3. Multilevel degenerative disc disease most significant at L5-S1.     Electronically Signed   By: Malachy Moan M.D.   On: 08/16/2023 16:11    She reports she has been constipated  She has tried Dulcolax Eather Colas but has not had bowel movement  She has tried manual disimpaction at home which provided relief and she had a soft bm  She also reports some burning with urination and is requesting urine testing      Medications: Outpatient Medications Prior to Visit  Medication Sig   alendronate (FOSAMAX) 70 MG tablet Take 1 tablet (70 mg total) by mouth every 7 (seven) days. Take with a full glass of water on an empty stomach.   b complex vitamins capsule Take 1 capsule by mouth daily.   COLLAGEN PO Take 1 tablet by mouth daily.   desonide (DESOWEN) 0.05 % cream Apply topically 2 (two) times daily. Apply for 5 days and then stop, may repeat as needed.   docusate sodium (COLACE) 100 MG capsule Take 1 capsule (100 mg total) by mouth 2 (two) times daily for 14 days. Take while taking the pain medication to prevent consitpation   FLUoxetine (PROZAC) 20 MG capsule TAKE 1 CAPSULE total) by mouth daily.   gabapentin (NEURONTIN) 300 MG capsule Take 1 capsule (300 mg total) by mouth 3 (three) times daily.   levothyroxine (SYNTHROID) 50 MCG tablet Take 1 tablet (50 mcg total) by  mouth daily.   Magnesium 400 MG CAPS Take 1 capsule by mouth daily at 2 PM.   omeprazole (PRILOSEC) 20 MG capsule Take 1 capsule (20 mg total) by mouth daily. NEEDS APPOINTMENT FOR FURTHER REFILLS   ondansetron (ZOFRAN-ODT) 4 MG disintegrating tablet Take 1 tablet (4 mg total) by mouth every 8 (eight) hours as needed for nausea or vomiting.   oxyCODONE (ROXICODONE) 5 MG immediate release tablet Take 1 tablet (5 mg total) by mouth every 6 (six) hours as needed for severe pain or moderate pain (Take one half (moderate) or one full (severe) tablet for pain.).   phenazopyridine (PYRIDIUM) 100 MG tablet Take 1 tablet (100 mg total) by mouth 3 (three) times daily as needed for pain.   valACYclovir (VALTREX) 500 MG tablet Take 1  tablet (500 mg total) by mouth as needed.   Vitamin D, Cholecalciferol, 400 UNITS TABS Take 1,000 Units by mouth.    vitamin E 180 MG (400 UNITS) capsule Take 400 Units by mouth daily.   [DISCONTINUED] naproxen (NAPROSYN) 375 MG tablet Take 1 tablet (375 mg total) by mouth 2 (two) times daily with a meal for 7 days.   No facility-administered medications prior to visit.    Review of Systems  Musculoskeletal:  Positive for back pain and gait problem.  Neurological:  Positive for numbness (hands- started before back pain). Negative for tremors, weakness and light-headedness.        Objective    BP 132/80   Pulse 80   Temp 97.9 F (36.6 C) (Oral)   Wt 172 lb 6.4 oz (78.2 kg)   SpO2 98%   BMI 31.53 kg/m     Physical Exam Constitutional:      General: She is awake. She is in acute distress.     Appearance: Normal appearance. She is well-developed and well-groomed.  HENT:     Head: Normocephalic and atraumatic.  Pulmonary:     Effort: Pulmonary effort is normal.  Musculoskeletal:     Cervical back: Normal and normal range of motion.     Lumbar back: Decreased range of motion.       Back:     Comments: Patient is observed to be uncomfortable while sitting and is constantly moving/repositioning  in chair   Neurological:     Mental Status: She is alert.  Psychiatric:        Attention and Perception: Attention and perception normal.        Mood and Affect: Mood and affect normal.        Speech: Speech normal.        Behavior: Behavior normal. Behavior is cooperative.       Results for orders placed or performed in visit on 08/23/23  WET PREP FOR TRICH, YEAST, CLUE   Specimen: Urine   Urine  Result Value Ref Range   Trichomonas Exam Negative Negative   Yeast Exam Negative Negative   Clue Cell Exam Negative Negative  Microscopic Examination   Urine  Result Value Ref Range   WBC, UA 11-30 (A) 0 - 5 /hpf   RBC, Urine 0-2 0 - 2 /hpf   Epithelial Cells (non renal)  0-10 0 - 10 /hpf   Casts Present (A) None seen /lpf   Cast Type Red cell casts (A) N/A   Bacteria, UA Many (A) None seen/Few  Urinalysis, Routine w reflex microscopic  Result Value Ref Range   Specific Gravity, UA 1.020 1.005 - 1.030   pH, UA 7.5  5.0 - 7.5   Color, UA Yellow Yellow   Appearance Ur Cloudy (A) Clear   Leukocytes,UA 3+ (A) Negative   Protein,UA Trace (A) Negative/Trace   Glucose, UA Negative Negative   Ketones, UA Negative Negative   RBC, UA Trace (A) Negative   Bilirubin, UA Negative Negative   Urobilinogen, Ur 1.0 0.2 - 1.0 mg/dL   Nitrite, UA Positive (A) Negative   Microscopic Examination See below:     Assessment & Plan      No follow-ups on file.       Problem List Items Addressed This Visit       Musculoskeletal and Integument   Non-traumatic compression fracture of L1 lumbar vertebra, sequela - Primary    Acute, new concern  Patient was evaluated in ED on 08/16/23 for acute back pain with findings of acute compression fracture of L1 endplate with approx 30% height loss and mild posterior retropulsion believed to be cause of pain She reports continued severe pain today with decreased mobility tolerance despite pain medications She has upcoming apt with Neurosurgery on 08/29/23 - recommend she keeps this  A refill of Oxycodone was sent yesterday for her by PCP so we reviewed taking this safely. Recommend she refrains from taking tizanidine for now to prevent sedation and interactions  Can continue to take Tylenol and Naproxen as recommended by ED- naproxen refill sent today Recommend Lidocaine patches for further pain relief  Will defer to Neurosurgery recommendations once she has been evaluated by them- for now will endeavor to control pain and recommend rest as tolerated.         Other   Back pain   Relevant Medications   naproxen (NAPROSYN) 375 MG tablet   Other Visit Diagnoses     Urinary tract infection without hematuria, site unspecified      Acute, new concern She reports vulvovaginal itching and discomfort with urination  UA was positive for Leukocytes, nitrites, bacteria today Will send in macrobid to assist with management  Urine culture ordered for ID and susceptibility testing- Results to dictate further management  Follow up as needed for persistent or progressing symptoms      Vulvovaginal itching       Relevant Orders   Urinalysis, Routine w reflex microscopic (Completed)   WET PREP FOR TRICH, YEAST, CLUE (Completed)   Urine Culture        No follow-ups on file.   I, Erial Fikes E Meade Carmack, PA-C, have reviewed all documentation for this visit. The documentation on 08/23/23 for the exam, diagnosis, procedures, and orders are all accurate and complete.   Jacquelin Hawking, MHS, PA-C [facility]

## 2023-08-23 NOTE — Assessment & Plan Note (Addendum)
Acute, new concern  Patient was evaluated in ED on 08/16/23 for acute back pain with findings of acute compression fracture of L1 endplate with approx 30% height loss and mild posterior retropulsion believed to be cause of pain She reports continued severe pain today with decreased mobility tolerance despite pain medications She has upcoming apt with Neurosurgery on 08/29/23 - recommend she keeps this  A refill of Oxycodone was sent yesterday for her by PCP so we reviewed taking this safely. Recommend she refrains from taking tizanidine for now to prevent sedation and interactions  Can continue to take Tylenol and Naproxen as recommended by ED- naproxen refill sent today Recommend Lidocaine patches for further pain relief  Will defer to Neurosurgery recommendations once she has been evaluated by them- for now will endeavor to control pain and recommend rest as tolerated.

## 2023-08-23 NOTE — Progress Notes (Signed)
Your urine was notable for white blood cells, nitrites, and blood which is consistent with a UTI I have sent a sample off for a urine culture to assist with isolating the bacteria and making sure we have put you on the correct antibiotic  I have sent in a script for Macrobid for you to take to treat this while we are waiting for the culture results. Please take this as directed unless you are instructed to stop or develop allergic reaction.  We will keep you updated with the culture results once available.

## 2023-08-24 ENCOUNTER — Other Ambulatory Visit: Payer: Self-pay | Admitting: Nurse Practitioner

## 2023-08-24 DIAGNOSIS — M4856XD Collapsed vertebra, not elsewhere classified, lumbar region, subsequent encounter for fracture with routine healing: Secondary | ICD-10-CM | POA: Diagnosis not present

## 2023-08-24 DIAGNOSIS — R300 Dysuria: Secondary | ICD-10-CM | POA: Diagnosis not present

## 2023-08-24 DIAGNOSIS — M62830 Muscle spasm of back: Secondary | ICD-10-CM | POA: Diagnosis not present

## 2023-08-24 DIAGNOSIS — K5900 Constipation, unspecified: Secondary | ICD-10-CM | POA: Diagnosis not present

## 2023-08-24 DIAGNOSIS — K219 Gastro-esophageal reflux disease without esophagitis: Secondary | ICD-10-CM | POA: Diagnosis not present

## 2023-08-24 DIAGNOSIS — Z79899 Other long term (current) drug therapy: Secondary | ICD-10-CM | POA: Diagnosis not present

## 2023-08-24 DIAGNOSIS — M810 Age-related osteoporosis without current pathological fracture: Secondary | ICD-10-CM | POA: Diagnosis not present

## 2023-08-24 DIAGNOSIS — S32018D Other fracture of first lumbar vertebra, subsequent encounter for fracture with routine healing: Secondary | ICD-10-CM | POA: Diagnosis not present

## 2023-08-24 DIAGNOSIS — M797 Fibromyalgia: Secondary | ICD-10-CM | POA: Diagnosis not present

## 2023-08-24 DIAGNOSIS — M62838 Other muscle spasm: Secondary | ICD-10-CM | POA: Diagnosis not present

## 2023-08-24 NOTE — Telephone Encounter (Signed)
Requested medication (s) are due for refill today: yes  Requested medication (s) are on the active medication list: no  Last refill:  08/14/23  Future visit scheduled: yes  Notes to clinic:  cannot find out where or who ordered Zanaflex Med not delegated to NT to RF   Requested Prescriptions  Pending Prescriptions Disp Refills   tiZANidine (ZANAFLEX) 2 MG tablet [Pharmacy Med Name: TIZANIDINE HCL 2 MG TABLET] 10 tablet 0    Sig: Take 1 tablet (2 mg total) by mouth 2 (two) times daily as needed for Muscle spasms     Not Delegated - Cardiovascular:  Alpha-2 Agonists - tizanidine Failed - 08/24/2023 12:38 PM      Failed - This refill cannot be delegated      Passed - Valid encounter within last 6 months    Recent Outpatient Visits           Yesterday Non-traumatic compression fracture of L1 lumbar vertebra, sequela   [place] [facility] Mecum, Erin E, PA-C   1 month ago Depression, major, single episode, mild (HCC)   [place] [facility] [place], [place] T, NP   6 months ago Hypothyroidism, unspecified type   [place] [facility] [place], [place] T, NP   7 months ago Acute cystitis without hematuria   [place] [facility] Mecum, Oswaldo Conroy, PA-C   7 months ago [employer] annual wellness visit, subsequent   [place] [facility] [place], Dorie Rank, NP       Future Appointments             In 4 months [place], Dorie Rank, NP  [facility], [facility]

## 2023-08-24 NOTE — Progress Notes (Deleted)
Referring Physician:  Marjie Skiff, NP [address] [place],  [place] [address]  Primary Physician:  Marjie Skiff, NP  History of Present Illness: 08/24/2023 Angela Martinez is here today with a chief complaint of ***  Low back pain, acute compression fracture of L1  TLSO brace?   Duration: 2 weeks Location: *** Quality: *** Severity: ***  Precipitating: aggravated by movement Modifying factors: made better by *** Weakness: none Timing: constant Bowel/Bladder Dysfunction: none  Conservative measures:  Physical therapy: has not participated in PT  Multimodal medical therapy including regular antiinflammatories: Oxycodone, Tizanidine, Tylenol, Naproxen  Injections: no epidural steroid injections  Past Surgery: Lumbar Fusion L4-L5  [name] has ***no symptoms of cervical myelopathy.  The symptoms are causing a significant impact on the patient's life.   I have utilized the care everywhere function in epic to review the outside records available from external health systems.  Review of Systems:  A 10 point review of systems is negative, except for the pertinent positives and negatives detailed in the HPI.  Past Medical History: Past Medical History:  Diagnosis Date   Anxiety    Complication of anesthesia    WITH WRIST SURGERY HARD TO GET RELAXED. OK WITH KNEE / BACK   Cough    Depression    Fibromyalgia    GERD (gastroesophageal reflux disease)    Herpes simplex    1 AND 2   Hyperlipidemia    OA (osteoarthritis)    Osteopenia    Overweight    Plantar fasciitis    Shortness of breath dyspnea    DOE   Tremors of nervous system     Past Surgical History: Past Surgical History:  Procedure Laterality Date   BACK SURGERY     BUNIONECTOMY     CATARACT EXTRACTION W/PHACO Right 03/16/2016   Procedure: CATARACT EXTRACTION PHACO AND INTRAOCULAR LENS PLACEMENT (IOC);  Surgeon: Lockie Mola, MD;  Location: [facility] ORS;  Service: Ophthalmology;   Laterality: Right;  [place] 52.4 AP% 8.8 CDE 4.63 Fluid Pack Lot # [id number]   CATARACT EXTRACTION W/PHACO Left 05/18/2016   Procedure: CATARACT EXTRACTION PHACO AND INTRAOCULAR LENS PLACEMENT (IOC);  Surgeon: Lockie Mola, MD;  Location: [facility] ORS;  Service: Ophthalmology;  Laterality: Left;  [place] 47.3 AP% 10.4 CDE 4.90 Fluid pack lot # [id number]   DILATION AND CURETTAGE OF UTERUS     x 3   JOINT REPLACEMENT     LUMBAR FUSION     L4-L5 with cage   TOTAL KNEE ARTHROPLASTY Left 2007   WRIST SURGERY      Allergies: Allergies as of 08/29/2023 - Review Complete 08/23/2023  Allergen Reaction Noted   Zetia [ezetimibe]  09/26/2022   Atorvastatin  10/15/2019   Crestor [rosuvastatin calcium]  10/15/2019   Elemental sulfur  06/08/2015   Lyrica [pregabalin] Other (See Comments) 06/08/2015   Amoxicillin-pot clavulanate Nausea Only 05/10/2020    Medications:  Current Outpatient Medications:    alendronate (FOSAMAX) 70 MG tablet, Take 1 tablet (70 mg total) by mouth every 7 (seven) days. Take with a full glass of water on an empty stomach., Disp: 4 tablet, Rfl: 11   b complex vitamins capsule, Take 1 capsule by mouth daily., Disp: , Rfl:    COLLAGEN PO, Take 1 tablet by mouth daily., Disp: , Rfl:    desonide (DESOWEN) 0.05 % cream, Apply topically 2 (two) times daily. Apply for 5 days and then stop, may repeat as needed., Disp: 30  g, Rfl: 1   docusate sodium (COLACE) 100 MG capsule, Take 1 capsule (100 mg total) by mouth 2 (two) times daily for 14 days. Take while taking the pain medication to prevent consitpation, Disp: 28 capsule, Rfl: 0   FLUoxetine (PROZAC) 20 MG capsule, TAKE 1 CAPSULE total) by mouth daily., Disp: 90 capsule, Rfl: 4   gabapentin (NEURONTIN) 300 MG capsule, Take 1 capsule (300 mg total) by mouth 3 (three) times daily., Disp: 270 capsule, Rfl: 3   levothyroxine (SYNTHROID) 50 MCG tablet, Take 1 tablet (50 mcg total) by mouth daily., Disp: 60 tablet, Rfl: 3   Magnesium 400  MG CAPS, Take 1 capsule by mouth daily at 2 PM., Disp: , Rfl:    naproxen (NAPROSYN) 375 MG tablet, Take 1 tablet (375 mg total) by mouth 2 (two) times daily with a meal for 7 days., Disp: 14 tablet, Rfl: 0   nitrofurantoin, macrocrystal-monohydrate, (MACROBID) 100 MG capsule, Take 1 capsule (100 mg total) by mouth 2 (two) times daily for 5 days., Disp: 10 capsule, Rfl: 0   omeprazole (PRILOSEC) 20 MG capsule, Take 1 capsule (20 mg total) by mouth daily. NEEDS APPOINTMENT FOR FURTHER REFILLS, Disp: 90 capsule, Rfl: 2   ondansetron (ZOFRAN-ODT) 4 MG disintegrating tablet, Take 1 tablet (4 mg total) by mouth every 8 (eight) hours as needed for nausea or vomiting., Disp: 20 tablet, Rfl: 0   oxyCODONE (ROXICODONE) 5 MG immediate release tablet, Take 1 tablet (5 mg total) by mouth every 6 (six) hours as needed for severe pain or moderate pain (Take one half (moderate) or one full (severe) tablet for pain.)., Disp: 15 tablet, Rfl: 0   phenazopyridine (PYRIDIUM) 100 MG tablet, Take 1 tablet (100 mg total) by mouth 3 (three) times daily as needed for pain., Disp: 10 tablet, Rfl: 0   valACYclovir (VALTREX) 500 MG tablet, Take 1 tablet (500 mg total) by mouth as needed., Disp: 30 tablet, Rfl: 5   Vitamin D, Cholecalciferol, 400 UNITS TABS, Take 1,000 Units by mouth. , Disp: , Rfl:    vitamin E 180 MG (400 UNITS) capsule, Take 400 Units by mouth daily., Disp: , Rfl:   Social History: Social History   Tobacco Use   Smoking status: Never   Smokeless tobacco: Never  Vaping Use   Vaping status: Never Used  Substance Use Topics   Alcohol use: No   Drug use: No    Family Medical History: Family History  Problem Relation Age of Onset   Alcohol abuse Father    Alcohol abuse Sister    Arthritis Sister        5 sisters, some with RA   Seizures Sister    Cancer Sister        breast   Cirrhosis Sister    Diabetes Brother    Breast cancer Neg Hx     Physical Examination: There were no vitals filed  for this visit.  General: Patient is in no apparent distress. Attention to examination is appropriate.  Neck:   Supple.  Full range of motion.  Respiratory: Patient is breathing without any difficulty.   NEUROLOGICAL:     Awake, alert, oriented to person, place, and time.  Speech is clear and fluent.   Cranial Nerves: Pupils equal round and reactive to light.  Facial tone is symmetric.  Facial sensation is symmetric. Shoulder shrug is symmetric. Tongue protrusion is midline.    Strength: Side Biceps Triceps Deltoid Interossei Grip Wrist Ext. Wrist Flex.  R  5 5 5 5 5 5 5   L 5 5 5 5 5 5 5    Side Iliopsoas Quads Hamstring PF DF EHL  R 5 5 5 5 5 5   L 5 5 5 5 5 5    Reflexes are ***2+ and symmetric at the biceps, triceps, brachioradialis, patella and achilles.   Hoffman's is absent. Clonus is absent  Bilateral upper and lower extremity sensation is intact to light touch ***.     No evidence of dysmetria noted.  Gait is normal.    Imaging: *** I have personally reviewed the images and agree with the above interpretation.  Medical Decision Making/Assessment and Plan: Ms. Haston is a pleasant 72 y.o. female with ***  There are no diagnoses linked to this encounter.   Thank you for involving me in the care of this patient.    Lovenia Kim MD/MSCR Neurosurgery

## 2023-08-27 LAB — URINE CULTURE

## 2023-08-28 NOTE — Progress Notes (Signed)
Your urine culture results have come back.  You are found to have a UTI caused by E. coli.  The Macrobid that was sent in for you during your appointment should be sufficient to adequately treat this.  Please make sure that you have completed the entire course as directed and stay well-hydrated.  If you are still having symptoms please let us know.

## 2023-08-29 ENCOUNTER — Ambulatory Visit: Payer: PPO | Admitting: Neurosurgery

## 2023-09-01 ENCOUNTER — Other Ambulatory Visit: Payer: Self-pay | Admitting: Physician Assistant

## 2023-09-01 ENCOUNTER — Other Ambulatory Visit: Payer: Self-pay | Admitting: Nurse Practitioner

## 2023-09-01 DIAGNOSIS — M545 Low back pain, unspecified: Secondary | ICD-10-CM

## 2023-09-03 ENCOUNTER — Other Ambulatory Visit: Payer: PPO

## 2023-09-03 DIAGNOSIS — M7918 Myalgia, other site: Secondary | ICD-10-CM | POA: Diagnosis not present

## 2023-09-03 DIAGNOSIS — M62830 Muscle spasm of back: Secondary | ICD-10-CM | POA: Diagnosis not present

## 2023-09-03 DIAGNOSIS — S32010D Wedge compression fracture of first lumbar vertebra, subsequent encounter for fracture with routine healing: Secondary | ICD-10-CM | POA: Diagnosis not present

## 2023-09-03 NOTE — Telephone Encounter (Signed)
Requested medications are due for refill today.  yes  Requested medications are on the active medications list.  yes  Last refill. 08/22/2023 #15 0 rf  Future visit scheduled.   yes  Notes to clinic.  Refill not delegated.    Requested Prescriptions  Pending Prescriptions Disp Refills   oxyCODONE (OXY IR/ROXICODONE) 5 MG immediate release tablet [Pharmacy Med Name: OXYCODONE HCL (IR) 5 MG TABLET] 15 tablet 0    Sig: Take 1 tablet (5 mg total) by mouth every 6 (six) hours as needed for severe pain or moderate pain (Take one half (moderate) or one full (severe) tablet for pain.).     Not Delegated - Analgesics:  Opioid Agonists Failed - 09/01/2023 12:19 PM      Failed - This refill cannot be delegated      Failed - Urine Drug Screen completed in last 360 days      Passed - Valid encounter within last 3 months    Recent Outpatient Visits           1 week ago Non-traumatic compression fracture of L1 lumbar vertebra, sequela   [place] [facility] Mecum, Erin E, PA-C   2 months ago Depression, major, single episode, mild (HCC)   [place] [facility] [place], [place] T, NP   6 months ago Hypothyroidism, unspecified type   [place] [facility] [place], Corrie Dandy T, NP   7 months ago Acute cystitis without hematuria   [place] [facility] Mecum, Oswaldo Conroy, PA-C   8 months ago [employer] annual wellness visit, subsequent   [place] [facility] [place], Dorie Rank, NP       Future Appointments             In 3 months Cannady, Dorie Rank, NP  [employer]

## 2023-09-03 NOTE — Telephone Encounter (Signed)
Requested medications are due for refill today.  unsure  Requested medications are on the active medications list.  no  Last refill. 08/23/2023 #14 0 rf  Future visit scheduled.   yes  Notes to clinic.  Med not on med list. Pt was to take med for 7 days.     Requested Prescriptions  Pending Prescriptions Disp Refills   naproxen (NAPROSYN) 375 MG tablet [Pharmacy Med Name: NAPROXEN 375 MG TABLET] 14 tablet 0    Sig: Take 1 tablet (375 mg total) by mouth 2 (two) times daily with a mealfor 7 days.     Analgesics:  NSAIDS Failed - 09/01/2023 12:19 PM      Failed - Manual Review: Labs are only required if the patient has taken medication for more than 8 weeks.      Passed - Cr in normal range and within 360 days    Creatinine, Ser  Date Value Ref Range Status  08/16/2023 0.97 0.44 - 1.00 mg/dL Final         Passed - HGB in normal range and within 360 days    Hemoglobin  Date Value Ref Range Status  12/28/2022 13.4 11.1 - 15.9 g/dL Final         Passed - PLT in normal range and within 360 days    Platelets  Date Value Ref Range Status  12/28/2022 318 150 - 450 x10E3/uL Final         Passed - HCT in normal range and within 360 days    Hematocrit  Date Value Ref Range Status  12/28/2022 43.1 34.0 - 46.6 % Final         Passed - eGFR is 30 or above and within 360 days    GFR calc Af Amer  Date Value Ref Range Status  12/22/2020 60 >59 mL/min/1.73 Final    Comment:    **In accordance with recommendations from the NKF-ASN Task force,**   [facility] is in the process of updating its eGFR calculation to the   2021 CKD-EPI creatinine equation that estimates kidney function   without a race variable.    GFR, Estimated  Date Value Ref Range Status  08/16/2023 >60 >60 mL/min Final    Comment:    (NOTE) Calculated using the CKD-EPI Creatinine Equation (2021)    eGFR  Date Value Ref Range Status  07/31/2023 59 (L) >59 mL/min/1.73 Final         Passed - Patient is not  pregnant      Passed - Valid encounter within last 12 months    Recent Outpatient Visits           1 week ago Non-traumatic compression fracture of L1 lumbar vertebra, sequela   [place] [facility] Mecum, Erin E, PA-C   2 months ago Depression, major, single episode, mild (HCC)   [place] [facility] [place], [place] T, NP   6 months ago Hypothyroidism, unspecified type   [place] [facility] [place], Corrie Dandy T, NP   7 months ago Acute cystitis without hematuria   [place] [facility] Mecum, Oswaldo Conroy, PA-C   8 months ago [employer] annual wellness visit, subsequent   [place] [facility] [place], Dorie Rank, NP       Future Appointments             In 3 months Cannady, Dorie Rank, NP [place] [employer] [facility]

## 2023-09-06 ENCOUNTER — Telehealth: Payer: Self-pay | Admitting: Nurse Practitioner

## 2023-09-06 NOTE — Telephone Encounter (Signed)
FYI: Patient's husband Camauri Fleece came by the office to drop off AVS for Ms. Ingle from  [facility] for Aura Dials, NP to review. I am placing this form in providers folder.

## 2023-09-06 NOTE — Telephone Encounter (Signed)
Form placed in providers folder for review.

## 2023-09-07 ENCOUNTER — Encounter: Admission: RE | Payer: Self-pay | Source: Home / Self Care

## 2023-09-07 ENCOUNTER — Ambulatory Visit: Admission: RE | Admit: 2023-09-07 | Payer: PPO | Source: Home / Self Care | Admitting: Orthopedic Surgery

## 2023-09-07 DIAGNOSIS — Z880 Allergy status to penicillin: Secondary | ICD-10-CM

## 2023-09-07 DIAGNOSIS — S32010D Wedge compression fracture of first lumbar vertebra, subsequent encounter for fracture with routine healing: Secondary | ICD-10-CM | POA: Diagnosis not present

## 2023-09-07 DIAGNOSIS — M1711 Unilateral primary osteoarthritis, right knee: Secondary | ICD-10-CM

## 2023-09-07 SURGERY — ARTHROPLASTY, KNEE, TOTAL, USING IMAGELESS COMPUTER-ASSISTED NAVIGATION
Anesthesia: Choice | Site: Knee | Laterality: Right

## 2023-09-10 DIAGNOSIS — S32010D Wedge compression fracture of first lumbar vertebra, subsequent encounter for fracture with routine healing: Secondary | ICD-10-CM | POA: Diagnosis not present

## 2023-09-11 ENCOUNTER — Other Ambulatory Visit: Payer: Self-pay | Admitting: Nurse Practitioner

## 2023-09-11 ENCOUNTER — Other Ambulatory Visit: Payer: PPO

## 2023-09-11 DIAGNOSIS — R300 Dysuria: Secondary | ICD-10-CM | POA: Diagnosis not present

## 2023-09-11 DIAGNOSIS — E039 Hypothyroidism, unspecified: Secondary | ICD-10-CM

## 2023-09-11 DIAGNOSIS — R8281 Pyuria: Secondary | ICD-10-CM | POA: Diagnosis not present

## 2023-09-11 LAB — URINALYSIS, ROUTINE W REFLEX MICROSCOPIC
Bilirubin, UA: NEGATIVE
Glucose, UA: NEGATIVE
Ketones, UA: NEGATIVE
Nitrite, UA: POSITIVE — AB
Protein,UA: NEGATIVE
Specific Gravity, UA: 1.015 (ref 1.005–1.030)
Urobilinogen, Ur: 0.2 mg/dL (ref 0.2–1.0)
pH, UA: 8 — ABNORMAL HIGH (ref 5.0–7.5)

## 2023-09-11 LAB — MICROSCOPIC EXAMINATION

## 2023-09-11 MED ORDER — CIPROFLOXACIN HCL 500 MG PO TABS: 500.0000 mg | ORAL_TABLET | Freq: Every day | ORAL | 0 refills | Status: AC

## 2023-09-11 NOTE — Progress Notes (Signed)
Contacted via MyChart   Good afternoon Shamikia, it looks like urine infection is still lingering.  I know you were treated with Macrobid recently, but it has not fully cleared it.  I am sending your urine for culture and for now we will start Ciprofloxacin, which is stronger, for treatment.  It was susceptible to past infection.  If changes needed I will let you know.  Any questions? Keep being awesome!!  Thank you for allowing me to participate in your care.  I appreciate you. Kindest regards, Alante Tolan

## 2023-09-12 LAB — TSH: TSH: 3.12 u[IU]/mL (ref 0.450–4.500)

## 2023-09-12 LAB — T4, FREE: Free T4: 1.29 ng/dL (ref 0.82–1.77)

## 2023-09-12 NOTE — Progress Notes (Signed)
Contacted via MyChart   Good afternoon Angela Martinez, your thyroid labs are stable at this time.  Continue current Levothyroxine dosing.:)

## 2023-09-14 DIAGNOSIS — S32010D Wedge compression fracture of first lumbar vertebra, subsequent encounter for fracture with routine healing: Secondary | ICD-10-CM | POA: Diagnosis not present

## 2023-09-16 LAB — URINE CULTURE

## 2023-09-16 NOTE — Progress Notes (Signed)
Contacted via MyChart   Good news Cipro is sensitive to what is growing in your urine:)

## 2023-09-17 ENCOUNTER — Ambulatory Visit: Payer: PPO | Admitting: Neurosurgery

## 2023-09-18 ENCOUNTER — Telehealth: Payer: Self-pay | Admitting: Nurse Practitioner

## 2023-09-18 DIAGNOSIS — S32010D Wedge compression fracture of first lumbar vertebra, subsequent encounter for fracture with routine healing: Secondary | ICD-10-CM | POA: Diagnosis not present

## 2023-09-18 NOTE — Telephone Encounter (Signed)
Patient would like a call regarding her last labs

## 2023-09-18 NOTE — Telephone Encounter (Signed)
Pt called back concerning the AVS from [facility]. She said the dr from [facility] advised pt there is a medication she could be switched to that would be good for her bones, good for fibromyalgia and her depression.  Advised pt she needs to make an appt to discuss the medication w/ Jolene. She had been waiting to speak w/ Jolene concerning this new medication since husband dropped off the AVS from [facility]. Offered pt appt, but pt declined.  Pt states she will wait until Dec 31, 2023 when she has her appt.

## 2023-09-19 ENCOUNTER — Encounter: Payer: Self-pay | Admitting: Nurse Practitioner

## 2023-09-19 ENCOUNTER — Ambulatory Visit (INDEPENDENT_AMBULATORY_CARE_PROVIDER_SITE_OTHER): Payer: PPO | Admitting: Nurse Practitioner

## 2023-09-19 VITALS — BP 130/82 | HR 82 | Temp 98.0°F | Ht 62.0 in | Wt 167.8 lb

## 2023-09-19 DIAGNOSIS — F320 Major depressive disorder, single episode, mild: Secondary | ICD-10-CM

## 2023-09-19 DIAGNOSIS — B351 Tinea unguium: Secondary | ICD-10-CM

## 2023-09-19 DIAGNOSIS — T148XXA Other injury of unspecified body region, initial encounter: Secondary | ICD-10-CM

## 2023-09-19 DIAGNOSIS — M4856XS Collapsed vertebra, not elsewhere classified, lumbar region, sequela of fracture: Secondary | ICD-10-CM

## 2023-09-19 MED ORDER — CICLOPIROX 8 % EX SOLN: Freq: Every day | CUTANEOUS | 1 refills | Status: DC

## 2023-09-19 MED ORDER — DULOXETINE HCL 30 MG PO CPEP: ORAL_CAPSULE | ORAL | 3 refills | Status: DC

## 2023-09-19 MED ORDER — MUPIROCIN 2 % EX OINT: 1.0000 | TOPICAL_OINTMENT | Freq: Two times a day (BID) | CUTANEOUS | 0 refills | Status: DC

## 2023-09-19 NOTE — Progress Notes (Signed)
BP 130/82 (BP Location: Left Arm, Patient Position: Sitting, Cuff Size: Normal)   Pulse 82   Temp 98 F (36.7 C) (Oral)   Ht 5\' 2"  (1.575 m)   Wt 167 lb 12.8 oz (76.1 kg)   SpO2 98%   BMI 30.69 kg/m    Subjective:    Patient ID: Angela Martinez, female    DOB: 1950/12/04, 72 y.o.   MRN: [id number]  HPI: Angela Martinez is a 72 y.o. female  Chief Complaint  Patient presents with   Rectal Problems    Wants area near rectum checked   Depression    Possible depression due to crying spells, irritable than normal, has pain in the mid back and radiates down to lower back, look into changing the fluoxetine to Duloxetine    Nail Problem    Concern for fungal disease on one toe   Reports concern about rectal area issues due to recent constipation with taking opioid therapy for L1 compression fracture, is not taking anymore of this.  Started a bowel regimen and then this turned into diarrhea, too much of it.  Is passing bowels better now.   Has fungal disease to toe 2nd toe on right foot she is concerned about.  DEPRESSION Taking Prozac 20 MG daily.  However, since her recent compression fracture to L1 her mood has been worse.  Following with neurosurgery for this.  She reports Dr. Daivd Council with [facility] spine recommended she try changing over to Duloxetine. Mood status: uncontrolled Satisfied with current treatment?: no Symptom severity: moderate  Duration of current treatment : chronic Side effects: no Medication compliance: good compliance Psychotherapy/counseling: none Previous psychiatric medications: Prozac Depressed mood: yes Anxious mood: yes Anhedonia: no Significant weight loss or gain: no Insomnia: occasional due to back pain Fatigue: yes Feelings of worthlessness or guilt: no Impaired concentration/indecisiveness: no Suicidal ideations: no Hopelessness: yes Crying spells: yes    09/19/2023    3:28 PM 08/23/2023    9:59 AM 06/29/2023    9:29 AM 02/13/2023    9:10 AM 01/18/2023    11:00 AM  Depression screen PHQ 2/9  Decreased Interest 2 0 0 1 0  Down, Depressed, Hopeless 3 0 1 1 1   PHQ - 2 Score 5 0 1 2 1   Altered sleeping 1 0 1 1 0  Tired, decreased energy 2 0 1 1 1   Change in appetite 0 0 1 0 0  Feeling bad or failure about yourself  1 0 0 0 0  Trouble concentrating 0 0 0 1 1  Moving slowly or fidgety/restless 0 0 1 0 0  Suicidal thoughts 0 0 0 0 0  PHQ-9 Score 9 0 5 5 3   Difficult doing work/chores Somewhat difficult Not difficult at all Somewhat difficult Not difficult at all Not difficult at all       09/19/2023    3:34 PM 08/23/2023   10:00 AM 06/29/2023    9:30 AM 02/13/2023    9:10 AM  GAD 7 : Generalized Anxiety Score  Nervous, Anxious, on Edge 1 0 1 0  Control/stop worrying 1 0 1 0  Worry too much - different things 1 0 1 1  Trouble relaxing 0 0 1 1  Restless 1 0 1 1  Easily annoyed or irritable 1 0 1 0  Afraid - awful might happen 1 0 1 1  Total GAD 7 Score 6 0 7 4  Anxiety Difficulty Somewhat difficult Not difficult at all Somewhat difficult  Not difficult at all   Relevant past medical, surgical, family and social history reviewed and updated as indicated. Interim medical history since our last visit reviewed. Allergies and medications reviewed and updated.  Review of Systems  Constitutional:  Negative for activity change, appetite change, diaphoresis, fatigue and fever.  Respiratory:  Negative for cough, chest tightness and shortness of breath.   Cardiovascular:  Negative for chest pain, palpitations and leg swelling.  Gastrointestinal:  Positive for constipation and diarrhea. Negative for abdominal distention, abdominal pain, nausea and vomiting.  Musculoskeletal:  Positive for back pain.  Neurological: Negative.   Psychiatric/Behavioral:  Positive for sleep disturbance. Negative for decreased concentration, self-injury and suicidal ideas. The patient is nervous/anxious.     Per HPI unless specifically indicated above     Objective:     BP 130/82 (BP Location: Left Arm, Patient Position: Sitting, Cuff Size: Normal)   Pulse 82   Temp 98 F (36.7 C) (Oral)   Ht 5\' 2"  (1.575 m)   Wt 167 lb 12.8 oz (76.1 kg)   SpO2 98%   BMI 30.69 kg/m   Wt Readings from Last 3 Encounters:  09/19/23 167 lb 12.8 oz (76.1 kg)  08/23/23 172 lb 6.4 oz (78.2 kg)  08/16/23 171 lb 15.3 oz (78 kg)    Physical Exam Vitals and nursing note reviewed.  Constitutional:      General: She is awake. She is not in acute distress.    Appearance: Normal appearance. She is well-developed and well-groomed. She is not ill-appearing or toxic-appearing.  HENT:     Head: Normocephalic.     Right Ear: Hearing and external ear normal.     Left Ear: Hearing and external ear normal.  Eyes:     General: Lids are normal.        Right eye: No discharge.        Left eye: No discharge.     Conjunctiva/sclera: Conjunctivae normal.     Pupils: Pupils are equal, round, and reactive to light.  Neck:     Thyroid: No thyromegaly.     Vascular: No carotid bruit.  Cardiovascular:     Rate and Rhythm: Normal rate and regular rhythm.     Pulses:          Dorsalis pedis pulses are 2+ on the right side and 2+ on the left side.       Posterior tibial pulses are 2+ on the right side and 2+ on the left side.     Heart sounds: Normal heart sounds. No murmur heard.    No gallop.  Pulmonary:     Effort: Pulmonary effort is normal. No accessory muscle usage or respiratory distress.     Breath sounds: Normal breath sounds. No decreased breath sounds, wheezing or rhonchi.  Abdominal:     General: Bowel sounds are normal. There is no distension.     Palpations: Abdomen is soft.     Tenderness: There is no abdominal tenderness.     Comments: Small area of irritation at gluteal fold with small <1/2 cm abrasion.   Musculoskeletal:     Cervical back: Normal range of motion and neck supple.     Right lower leg: No edema.     Left lower leg: No edema.     Right foot: Normal  range of motion.     Left foot: Normal range of motion.  Feet:     Right foot:     Protective Sensation: 10 sites  tested.  10 sites sensed.     Skin integrity: Skin integrity normal.     Toenail Condition: Fungal disease present.    Left foot:     Protective Sensation: 10 sites tested.  10 sites sensed.     Skin integrity: Skin integrity normal.     Toenail Condition: Fungal disease present. Lymphadenopathy:     Cervical: No cervical adenopathy.  Skin:    General: Skin is warm and dry.  Neurological:     Mental Status: She is alert and oriented to person, place, and time.     Deep Tendon Reflexes: Reflexes are normal and symmetric.     Reflex Scores:      Brachioradialis reflexes are 2+ on the right side and 2+ on the left side.      Patellar reflexes are 2+ on the right side and 2+ on the left side. Psychiatric:        Attention and Perception: Attention normal.        Mood and Affect: Mood normal. Affect is tearful.        Speech: Speech normal.        Behavior: Behavior normal. Behavior is cooperative.        Thought Content: Thought content normal.    Results for orders placed or performed in visit on 09/11/23  Urine Culture   Specimen: Urine   UR  Result Value Ref Range   Urine Culture, Routine Final report (A)    Organism ID, Bacteria Escherichia coli (A)    Antimicrobial Susceptibility Comment   Microscopic Examination   Urine  Result Value Ref Range   WBC, UA 11-30 (H) 0 - 5 /hpf   RBC, Urine 0-2 0 - 2 /hpf   Epithelial Cells (non renal) 0-10 0 - 10 /hpf   Bacteria, UA Many (A) None seen/Few  TSH  Result Value Ref Range   TSH 3.120 0.450 - 4.500 uIU/mL  T4, free  Result Value Ref Range   Free T4 1.29 0.82 - 1.77 ng/dL  Urinalysis, Routine w reflex microscopic  Result Value Ref Range   Specific Gravity, UA 1.015 1.005 - 1.030   pH, UA 8.0 (H) 5.0 - 7.5   Color, UA Yellow Yellow   Appearance Ur Cloudy (A) Clear   Leukocytes,UA 3+ (A) Negative    Protein,UA Negative Negative/Trace   Glucose, UA Negative Negative   Ketones, UA Negative Negative   RBC, UA Trace (A) Negative   Bilirubin, UA Negative Negative   Urobilinogen, Ur 0.2 0.2 - 1.0 mg/dL   Nitrite, UA Positive (A) Negative   Microscopic Examination See below:       Assessment & Plan:   Problem List Items Addressed This Visit       Musculoskeletal and Integument   Non-traumatic compression fracture of L1 lumbar vertebra, sequela    Ongoing and working with [facility] spine clinic.  Will start Duloxetine in clinic today for mood and pain + continue Gabapentin.  Continue collaboration with [facility].  Recent notes reviewed.      Onychomycosis    To 2nd toe on both feet R>L.  Start Penlac, educated her on this + educated on fungal disease and importance of being consistent with medication.      Relevant Medications   ciclopirox (PENLAC) 8 % solution   mupirocin ointment (BACTROBAN) 2 %     Other   Depression, major, single episode, mild (HCC) - Primary (Chronic)    Chronic, exacerbated by recent lower  back fracture.  Denies SI/HI.  Agree with trial of stopping Prozac and starting Duloxetine, this would benefit both mood and pain.  He is taking Gabapentin, so Duloxetine would be beneficial add on with this.  Educated her on medication and side effects that may present.  Will perform a direct switch: start Duloxetine at 30 MG daily for one week and if tolerating may increase to 60 MG daily at the one week mark.  Stop Prozac as currently offering no benefit.  Return in 6 weeks for mood check.      Relevant Medications   DULoxetine (CYMBALTA) 30 MG capsule   Abrasion    To between gluteal folds, small and no drainage.  Overall healing.  Will send in Mupirocin ointment to place to area and recommend when wiping to do so gently.  Use Witch Hazel wipes at home and ensure regular daily bowel movements with no straining.       Time: 25 minutes, >50% spent counseling/or care  coordination   Follow up plan: Return in about 6 weeks (around 10/31/2023) for Depression - changed from Prozac to Duloxetine.

## 2023-09-19 NOTE — Assessment & Plan Note (Signed)
To between gluteal folds, small and no drainage.  Overall healing.  Will send in Mupirocin ointment to place to area and recommend when wiping to do so gently.  Use Witch Hazel wipes at home and ensure regular daily bowel movements with no straining.

## 2023-09-19 NOTE — Patient Instructions (Signed)
Managing Depression, Adult Depression is a mental health condition that affects your thoughts, feelings, and actions. Being diagnosed with depression can bring you relief if you did not know why you have felt or behaved a certain way. It could also leave you feeling overwhelmed. Finding ways to manage your symptoms can help you feel more positive about your future. How to manage lifestyle changes Being depressed is difficult. Depression can increase the level of everyday stress. Stress can make depression symptoms worse. You may believe your symptoms cannot be managed or will never improve. However, there are many things you can try to help manage your symptoms. There is hope. Managing stress  Stress is your body's reaction to life changes and events, both good and bad. Stress can add to your feelings of depression. Learning to manage your stress can help lessen your feelings of depression. Try some of the following approaches to reducing your stress (stress reduction techniques): Listen to music that you enjoy and that inspires you. Try using a meditation app or take a meditation class. Develop a practice that helps you connect with your spiritual self. Walk in nature, pray, or go to a place of worship. Practice deep breathing. To do this, inhale slowly through your nose. Pause at the top of your inhale for a few seconds and then exhale slowly, letting yourself relax. Repeat this three or four times. Practice yoga to help relax and work your muscles. Choose a stress reduction technique that works for you. These techniques take time and practice to develop. Set aside 5-15 minutes a day to do them. Therapists can offer training in these techniques. Do these things to help manage stress: Keep a journal. Know your limits. Set healthy boundaries for yourself and others, such as saying "no" when you think something is too much. Pay attention to how you react to certain situations. You may not be able to  control everything, but you can change your reaction. Add humor to your life by watching funny movies or shows. Make time for activities that you enjoy and that relax you. Spend less time using electronics, especially at night before bed. The light from screens can make your brain think it is time to get up rather than go to bed.  Medicines Medicines, such as antidepressants, are often a part of treatment for depression. Talk with your pharmacist or health care provider about all the medicines, supplements, and herbal products that you take, their possible side effects, and what medicines and other products are safe to take together. Make sure to report any side effects you may have to your health care provider. Relationships Your health care provider may suggest family therapy, couples therapy, or individual therapy as part of your treatment. How to recognize changes Everyone responds differently to treatment for depression. As you recover from depression, you may start to: Have more interest in doing activities. Feel more hopeful. Have more energy. Eat a more regular amount of food. Have better mental focus. It is important to recognize if your depression is not getting better or is getting worse. The symptoms you had in the beginning may return, such as: Feeling tired. Eating too much or too little. Sleeping too much or too little. Feeling restless, agitated, or hopeless. Trouble focusing or making decisions. Having unexplained aches and pains. Feeling irritable, angry, or aggressive. If you or your family members notice these symptoms coming back, let your health care provider know right away. Follow these instructions at home: Activity Try to   get some form of exercise each day, such as walking. Try yoga, mindfulness, or other stress reduction techniques. Participate in group activities if you are able. Lifestyle Get enough sleep. Cut down on or stop using caffeine, tobacco,  alcohol, and any other harmful substances. Eat a healthy diet that includes plenty of vegetables, fruits, whole grains, low-fat dairy products, and lean protein. Limit foods that are high in solid fats, added sugar, or salt (sodium). General instructions Take over-the-counter and prescription medicines only as told by your health care provider. Keep all follow-up visits. It is important for your health care provider to check on your mood, behavior, and medicines. Your health care provider may need to make changes to your treatment. Where to find support Talking to others  Friends and family members can be sources of support and guidance. Talk to trusted friends or family members about your condition. Explain your symptoms and let them know that you are working with a health care provider to treat your depression. Tell friends and family how they can help. Finances Find mental health providers that fit with your financial situation. Talk with your health care provider if you are worried about access to food, housing, or medicine. Call your insurance company to learn about your co-pays and prescription plan. Where to find more information You can find support in your area from: Anxiety and Depression Association of America (ADAA): adaa.org Mental Health America: mentalhealthamerica.net National Alliance on Mental Illness: nami.org Contact a health care provider if: You stop taking your antidepressant medicines, and you have any of these symptoms: Nausea. Headache. Light-headedness. Chills and body aches. Not being able to sleep (insomnia). You or your friends and family think your depression is getting worse. Get help right away if: You have thoughts of hurting yourself or others. Get help right away if you feel like you may hurt yourself or others, or have thoughts about taking your own life. Go to your nearest emergency room or: Call 911. Call the National Suicide Prevention Lifeline at  1-800-273-8255 or 988. This is open 24 hours a day. Text the Crisis Text Line at 741741. This information is not intended to replace advice given to you by your health care provider. Make sure you discuss any questions you have with your health care provider. Document Revised: 03/14/2022 Document Reviewed: 03/14/2022 Elsevier Patient Education  2024 Elsevier Inc.  

## 2023-09-19 NOTE — Assessment & Plan Note (Signed)
To 2nd toe on both feet R>L.  Start [place], educated her on this + educated on fungal disease and importance of being consistent with medication.

## 2023-09-19 NOTE — Assessment & Plan Note (Signed)
Ongoing and working with [facility] spine clinic.  Will start Duloxetine in clinic today for mood and pain + continue Gabapentin.  Continue collaboration with [facility].  Recent notes reviewed.

## 2023-09-19 NOTE — Assessment & Plan Note (Signed)
Chronic, exacerbated by recent lower back fracture.  Denies SI/HI.  Agree with trial of stopping Prozac and starting Duloxetine, this would benefit both mood and pain.  He is taking Gabapentin, so Duloxetine would be beneficial add on with this.  Educated her on medication and side effects that may present.  Will perform a direct switch: start Duloxetine at 30 MG daily for one week and if tolerating may increase to 60 MG daily at the one week mark.  Stop Prozac as currently offering no benefit.  Return in 6 weeks for mood check.

## 2023-09-20 DIAGNOSIS — S32010D Wedge compression fracture of first lumbar vertebra, subsequent encounter for fracture with routine healing: Secondary | ICD-10-CM | POA: Diagnosis not present

## 2023-09-25 DIAGNOSIS — S32010D Wedge compression fracture of first lumbar vertebra, subsequent encounter for fracture with routine healing: Secondary | ICD-10-CM | POA: Diagnosis not present

## 2023-09-27 DIAGNOSIS — S32010D Wedge compression fracture of first lumbar vertebra, subsequent encounter for fracture with routine healing: Secondary | ICD-10-CM | POA: Diagnosis not present

## 2023-10-01 DIAGNOSIS — S32010D Wedge compression fracture of first lumbar vertebra, subsequent encounter for fracture with routine healing: Secondary | ICD-10-CM | POA: Diagnosis not present

## 2023-10-04 DIAGNOSIS — S32010D Wedge compression fracture of first lumbar vertebra, subsequent encounter for fracture with routine healing: Secondary | ICD-10-CM | POA: Diagnosis not present

## 2023-10-10 DIAGNOSIS — S32010D Wedge compression fracture of first lumbar vertebra, subsequent encounter for fracture with routine healing: Secondary | ICD-10-CM | POA: Diagnosis not present

## 2023-10-15 DIAGNOSIS — S32010D Wedge compression fracture of first lumbar vertebra, subsequent encounter for fracture with routine healing: Secondary | ICD-10-CM | POA: Diagnosis not present

## 2023-10-15 DIAGNOSIS — M4856XD Collapsed vertebra, not elsewhere classified, lumbar region, subsequent encounter for fracture with routine healing: Secondary | ICD-10-CM | POA: Diagnosis not present

## 2023-10-15 DIAGNOSIS — G8929 Other chronic pain: Secondary | ICD-10-CM | POA: Diagnosis not present

## 2023-10-15 DIAGNOSIS — M797 Fibromyalgia: Secondary | ICD-10-CM | POA: Diagnosis not present

## 2023-10-15 DIAGNOSIS — F32A Depression, unspecified: Secondary | ICD-10-CM | POA: Diagnosis not present

## 2023-10-15 DIAGNOSIS — N189 Chronic kidney disease, unspecified: Secondary | ICD-10-CM | POA: Diagnosis not present

## 2023-10-15 DIAGNOSIS — Z880 Allergy status to penicillin: Secondary | ICD-10-CM | POA: Diagnosis not present

## 2023-10-15 DIAGNOSIS — Z981 Arthrodesis status: Secondary | ICD-10-CM | POA: Diagnosis not present

## 2023-10-15 DIAGNOSIS — M62838 Other muscle spasm: Secondary | ICD-10-CM | POA: Diagnosis not present

## 2023-10-15 DIAGNOSIS — Z888 Allergy status to other drugs, medicaments and biological substances status: Secondary | ICD-10-CM | POA: Diagnosis not present

## 2023-10-15 DIAGNOSIS — Z882 Allergy status to sulfonamides status: Secondary | ICD-10-CM | POA: Diagnosis not present

## 2023-10-15 DIAGNOSIS — M62830 Muscle spasm of back: Secondary | ICD-10-CM | POA: Diagnosis not present

## 2023-10-15 DIAGNOSIS — M810 Age-related osteoporosis without current pathological fracture: Secondary | ICD-10-CM | POA: Diagnosis not present

## 2023-10-15 DIAGNOSIS — Z79899 Other long term (current) drug therapy: Secondary | ICD-10-CM | POA: Diagnosis not present

## 2023-10-15 DIAGNOSIS — Z7983 Long term (current) use of bisphosphonates: Secondary | ICD-10-CM | POA: Diagnosis not present

## 2023-10-15 DIAGNOSIS — Z791 Long term (current) use of non-steroidal anti-inflammatories (NSAID): Secondary | ICD-10-CM | POA: Diagnosis not present

## 2023-10-15 DIAGNOSIS — K219 Gastro-esophageal reflux disease without esophagitis: Secondary | ICD-10-CM | POA: Diagnosis not present

## 2023-10-16 DIAGNOSIS — S32010D Wedge compression fracture of first lumbar vertebra, subsequent encounter for fracture with routine healing: Secondary | ICD-10-CM | POA: Diagnosis not present

## 2023-10-19 DIAGNOSIS — S32010D Wedge compression fracture of first lumbar vertebra, subsequent encounter for fracture with routine healing: Secondary | ICD-10-CM | POA: Diagnosis not present

## 2023-10-22 DIAGNOSIS — S32010D Wedge compression fracture of first lumbar vertebra, subsequent encounter for fracture with routine healing: Secondary | ICD-10-CM | POA: Diagnosis not present

## 2023-10-24 DIAGNOSIS — S32010D Wedge compression fracture of first lumbar vertebra, subsequent encounter for fracture with routine healing: Secondary | ICD-10-CM | POA: Diagnosis not present

## 2023-10-28 NOTE — Patient Instructions (Signed)
Managing Depression, Adult Depression is a mental health condition that affects your thoughts, feelings, and actions. Being diagnosed with depression can bring you relief if you did not know why you have felt or behaved a certain way. It could also leave you feeling overwhelmed. Finding ways to manage your symptoms can help you feel more positive about your future. How to manage lifestyle changes Being depressed is difficult. Depression can increase the level of everyday stress. Stress can make depression symptoms worse. You may believe your symptoms cannot be managed or will never improve. However, there are many things you can try to help manage your symptoms. There is hope. Managing stress  Stress is your body's reaction to life changes and events, both good and bad. Stress can add to your feelings of depression. Learning to manage your stress can help lessen your feelings of depression. Try some of the following approaches to reducing your stress (stress reduction techniques): Listen to music that you enjoy and that inspires you. Try using a meditation app or take a meditation class. Develop a practice that helps you connect with your spiritual self. Walk in nature, pray, or go to a place of worship. Practice deep breathing. To do this, inhale slowly through your nose. Pause at the top of your inhale for a few seconds and then exhale slowly, letting yourself relax. Repeat this three or four times. Practice yoga to help relax and work your muscles. Choose a stress reduction technique that works for you. These techniques take time and practice to develop. Set aside 5-15 minutes a day to do them. Therapists can offer training in these techniques. Do these things to help manage stress: Keep a journal. Know your limits. Set healthy boundaries for yourself and others, such as saying "no" when you think something is too much. Pay attention to how you react to certain situations. You may not be able to  control everything, but you can change your reaction. Add humor to your life by watching funny movies or shows. Make time for activities that you enjoy and that relax you. Spend less time using electronics, especially at night before bed. The light from screens can make your brain think it is time to get up rather than go to bed.  Medicines Medicines, such as antidepressants, are often a part of treatment for depression. Talk with your pharmacist or health care provider about all the medicines, supplements, and herbal products that you take, their possible side effects, and what medicines and other products are safe to take together. Make sure to report any side effects you may have to your health care provider. Relationships Your health care provider may suggest family therapy, couples therapy, or individual therapy as part of your treatment. How to recognize changes Everyone responds differently to treatment for depression. As you recover from depression, you may start to: Have more interest in doing activities. Feel more hopeful. Have more energy. Eat a more regular amount of food. Have better mental focus. It is important to recognize if your depression is not getting better or is getting worse. The symptoms you had in the beginning may return, such as: Feeling tired. Eating too much or too little. Sleeping too much or too little. Feeling restless, agitated, or hopeless. Trouble focusing or making decisions. Having unexplained aches and pains. Feeling irritable, angry, or aggressive. If you or your family members notice these symptoms coming back, let your health care provider know right away. Follow these instructions at home: Activity Try to   get some form of exercise each day, such as walking. Try yoga, mindfulness, or other stress reduction techniques. Participate in group activities if you are able. Lifestyle Get enough sleep. Cut down on or stop using caffeine, tobacco,  alcohol, and any other harmful substances. Eat a healthy diet that includes plenty of vegetables, fruits, whole grains, low-fat dairy products, and lean protein. Limit foods that are high in solid fats, added sugar, or salt (sodium). General instructions Take over-the-counter and prescription medicines only as told by your health care provider. Keep all follow-up visits. It is important for your health care provider to check on your mood, behavior, and medicines. Your health care provider may need to make changes to your treatment. Where to find support Talking to others  Friends and family members can be sources of support and guidance. Talk to trusted friends or family members about your condition. Explain your symptoms and let them know that you are working with a health care provider to treat your depression. Tell friends and family how they can help. Finances Find mental health providers that fit with your financial situation. Talk with your health care provider if you are worried about access to food, housing, or medicine. Call your insurance company to learn about your co-pays and prescription plan. Where to find more information You can find support in your area from: Anxiety and Depression Association of America (ADAA): adaa.org Mental Health America: mentalhealthamerica.net National Alliance on Mental Illness: nami.org Contact a health care provider if: You stop taking your antidepressant medicines, and you have any of these symptoms: Nausea. Headache. Light-headedness. Chills and body aches. Not being able to sleep (insomnia). You or your friends and family think your depression is getting worse. Get help right away if: You have thoughts of hurting yourself or others. Get help right away if you feel like you may hurt yourself or others, or have thoughts about taking your own life. Go to your nearest emergency room or: Call 911. Call the National Suicide Prevention Lifeline at  1-800-273-8255 or 988. This is open 24 hours a day. Text the Crisis Text Line at 741741. This information is not intended to replace advice given to you by your health care provider. Make sure you discuss any questions you have with your health care provider. Document Revised: 03/14/2022 Document Reviewed: 03/14/2022 Elsevier Patient Education  2024 Elsevier Inc.  

## 2023-10-30 DIAGNOSIS — S32010D Wedge compression fracture of first lumbar vertebra, subsequent encounter for fracture with routine healing: Secondary | ICD-10-CM | POA: Diagnosis not present

## 2023-10-31 ENCOUNTER — Encounter: Payer: Self-pay | Admitting: Nurse Practitioner

## 2023-10-31 ENCOUNTER — Ambulatory Visit (INDEPENDENT_AMBULATORY_CARE_PROVIDER_SITE_OTHER): Payer: PPO | Admitting: Nurse Practitioner

## 2023-10-31 VITALS — BP 129/84 | HR 91 | Temp 98.2°F | Ht 62.0 in | Wt 164.4 lb

## 2023-10-31 DIAGNOSIS — F320 Major depressive disorder, single episode, mild: Secondary | ICD-10-CM

## 2023-10-31 MED ORDER — ONDANSETRON 4 MG PO TBDP
4.0000 mg | ORAL_TABLET | Freq: Three times a day (TID) | ORAL | 0 refills | Status: DC | PRN
Start: 2023-10-31 — End: 2024-02-08

## 2023-10-31 MED ORDER — METHOCARBAMOL 750 MG PO TABS: 750.0000 mg | ORAL_TABLET | Freq: Two times a day (BID) | ORAL | 1 refills | Status: DC

## 2023-10-31 MED ORDER — DULOXETINE HCL 30 MG PO CPEP: ORAL_CAPSULE | ORAL | 3 refills | Status: DC

## 2023-10-31 MED ORDER — ALENDRONATE SODIUM 70 MG PO TABS
70.0000 mg | ORAL_TABLET | ORAL | 11 refills | Status: DC
Start: 2023-10-31 — End: 2024-08-13

## 2023-10-31 NOTE — Progress Notes (Signed)
BP 129/84   Pulse 91   Temp 98.2 F (36.8 C) (Oral)   Ht 5\' 2"  (1.575 m)   Wt 164 lb 6.4 oz (74.6 kg)   SpO2 97%   BMI 30.07 kg/m    Subjective:    Patient ID: Angela Martinez, female    DOB: 05-10-1951, 72 y.o.   MRN: [id number]  HPI: Angela Martinez is a 72 y.o. female  Chief Complaint  Patient presents with   Depression    6 week f/up- patient states the duloxetine is helping but is only taking it every other day    DEPRESSION Follow-up today for mood.  Started Duloxetine 09/19/23, she is taking 30 MG daily -- has not increased to 60 MG. She reports this is helping her, so she was concerned about going up to 60 MG due to this. Mood status: stable Satisfied with current treatment?: yes Symptom severity: mild  Duration of current treatment : chronic Side effects: no Medication compliance: good compliance Psychotherapy/counseling: no Depressed mood:  just a little Anxious mood: just a little Anhedonia: no Significant weight loss or gain: no Insomnia: no Fatigue: no Feelings of worthlessness or guilt: no Impaired concentration/indecisiveness: no Suicidal ideations: no Hopelessness: no Crying spells: no    10/31/2023   10:57 AM 09/19/2023    3:28 PM 08/23/2023    9:59 AM 06/29/2023    9:29 AM 02/13/2023    9:10 AM  Depression screen PHQ 2/9  Decreased Interest 1 2 0 0 1  Down, Depressed, Hopeless 1 3 0 1 1  PHQ - 2 Score 2 5 0 1 2  Altered sleeping 2 1 0 1 1  Tired, decreased energy 1 2 0 1 1  Change in appetite 0 0 0 1 0  Feeling bad or failure about yourself  2 1 0 0 0  Trouble concentrating 0 0 0 0 1  Moving slowly or fidgety/restless 0 0 0 1 0  Suicidal thoughts 1 0 0 0 0  PHQ-9 Score 8 9 0 5 5  Difficult doing work/chores Somewhat difficult Somewhat difficult Not difficult at all Somewhat difficult Not difficult at all       10/31/2023   10:58 AM 09/19/2023    3:34 PM 08/23/2023   10:00 AM 06/29/2023    9:30 AM  GAD 7 : Generalized Anxiety Score  Nervous,  Anxious, on Edge 1 1 0 1  Control/stop worrying 0 1 0 1  Worry too much - different things 0 1 0 1  Trouble relaxing 2 0 0 1  Restless 0 1 0 1  Easily annoyed or irritable 1 1 0 1  Afraid - awful might happen 0 1 0 1  Total GAD 7 Score 4 6 0 7  Anxiety Difficulty Somewhat difficult Somewhat difficult Not difficult at all Somewhat difficult    Relevant past medical, surgical, family and social history reviewed and updated as indicated. Interim medical history since our last visit reviewed. Allergies and medications reviewed and updated.  Review of Systems  Constitutional:  Negative for activity change, appetite change, diaphoresis, fatigue and fever.  Respiratory:  Negative for cough, chest tightness and shortness of breath.   Cardiovascular:  Negative for chest pain, palpitations and leg swelling.  Gastrointestinal: Negative.   Neurological: Negative.   Psychiatric/Behavioral:  Positive for sleep disturbance. Negative for decreased concentration, self-injury and suicidal ideas. The patient is nervous/anxious.    Per HPI unless specifically indicated above     Objective:  BP 129/84   Pulse 91   Temp 98.2 F (36.8 C) (Oral)   Ht 5\' 2"  (1.575 m)   Wt 164 lb 6.4 oz (74.6 kg)   SpO2 97%   BMI 30.07 kg/m   Wt Readings from Last 3 Encounters:  10/31/23 164 lb 6.4 oz (74.6 kg)  09/19/23 167 lb 12.8 oz (76.1 kg)  08/23/23 172 lb 6.4 oz (78.2 kg)    Physical Exam Vitals and nursing note reviewed.  Constitutional:      General: She is awake. She is not in acute distress.    Appearance: Normal appearance. She is well-developed and well-groomed. She is not ill-appearing or toxic-appearing.  HENT:     Head: Normocephalic.     Right Ear: Hearing and external ear normal.     Left Ear: Hearing and external ear normal.  Eyes:     General: Lids are normal.        Right eye: No discharge.        Left eye: No discharge.     Conjunctiva/sclera: Conjunctivae normal.     Pupils:  Pupils are equal, round, and reactive to light.  Neck:     Thyroid: No thyromegaly.     Vascular: No carotid bruit.  Cardiovascular:     Rate and Rhythm: Normal rate and regular rhythm.     Heart sounds: Normal heart sounds. No murmur heard.    No gallop.  Pulmonary:     Effort: Pulmonary effort is normal. No accessory muscle usage or respiratory distress.     Breath sounds: Normal breath sounds.  Abdominal:     General: Bowel sounds are normal. There is no distension.     Palpations: Abdomen is soft.     Tenderness: There is no abdominal tenderness.  Musculoskeletal:     Cervical back: Normal range of motion and neck supple.     Right lower leg: No edema.     Left lower leg: No edema.  Lymphadenopathy:     Cervical: No cervical adenopathy.  Skin:    General: Skin is warm and dry.  Neurological:     Mental Status: She is alert and oriented to person, place, and time.     Deep Tendon Reflexes: Reflexes are normal and symmetric.     Reflex Scores:      Brachioradialis reflexes are 2+ on the right side and 2+ on the left side.      Patellar reflexes are 2+ on the right side and 2+ on the left side. Psychiatric:        Attention and Perception: Attention normal.        Mood and Affect: Mood normal.        Speech: Speech normal.        Behavior: Behavior normal. Behavior is cooperative.        Thought Content: Thought content normal.    Results for orders placed or performed in visit on 09/11/23  Urine Culture   Specimen: Urine   UR  Result Value Ref Range   Urine Culture, Routine Final report (A)    Organism ID, Bacteria Escherichia coli (A)    Antimicrobial Susceptibility Comment   Microscopic Examination   Urine  Result Value Ref Range   WBC, UA 11-30 (H) 0 - 5 /hpf   RBC, Urine 0-2 0 - 2 /hpf   Epithelial Cells (non renal) 0-10 0 - 10 /hpf   Bacteria, UA Many (A) None seen/Few  TSH  Result Value  Ref Range   TSH 3.120 0.450 - 4.500 uIU/mL  T4, free  Result Value  Ref Range   Free T4 1.29 0.82 - 1.77 ng/dL  Urinalysis, Routine w reflex microscopic  Result Value Ref Range   Specific Gravity, UA 1.015 1.005 - 1.030   pH, UA 8.0 (H) 5.0 - 7.5   Color, UA Yellow Yellow   Appearance Ur Cloudy (A) Clear   Leukocytes,UA 3+ (A) Negative   Protein,UA Negative Negative/Trace   Glucose, UA Negative Negative   Ketones, UA Negative Negative   RBC, UA Trace (A) Negative   Bilirubin, UA Negative Negative   Urobilinogen, Ur 0.2 0.2 - 1.0 mg/dL   Nitrite, UA Positive (A) Negative   Microscopic Examination See below:       Assessment & Plan:   Problem List Items Addressed This Visit       Other   Depression, major, single episode, mild (HCC) - Primary (Chronic)    Chronic, improving.  Denies SI/HI.  Continue Duloxetine as is seeing benefit and recommend she trial going up to 60 MG (2 tablets) if too much reduce back to 30 MG.  She is taking Gabapentin, so Duloxetine is beneficial add on with this.  Educated her on medication and side effects that may present.  Return in February for follow-up.      Relevant Medications   ondansetron (ZOFRAN-ODT) 4 MG disintegrating tablet   alendronate (FOSAMAX) 70 MG tablet   DULoxetine (CYMBALTA) 30 MG capsule     Follow up plan: Return for as scheduled February 10th, 2025.

## 2023-10-31 NOTE — Assessment & Plan Note (Signed)
Chronic, improving.  Denies SI/HI.  Continue Duloxetine as is seeing benefit and recommend she trial going up to 60 MG (2 tablets) if too much reduce back to 30 MG.  She is taking Gabapentin, so Duloxetine is beneficial add on with this.  Educated her on medication and side effects that may present.  Return in February for follow-up.

## 2023-11-01 DIAGNOSIS — S32010D Wedge compression fracture of first lumbar vertebra, subsequent encounter for fracture with routine healing: Secondary | ICD-10-CM | POA: Diagnosis not present

## 2023-11-08 DIAGNOSIS — S32010D Wedge compression fracture of first lumbar vertebra, subsequent encounter for fracture with routine healing: Secondary | ICD-10-CM | POA: Diagnosis not present

## 2023-11-20 DIAGNOSIS — S32010D Wedge compression fracture of first lumbar vertebra, subsequent encounter for fracture with routine healing: Secondary | ICD-10-CM | POA: Diagnosis not present

## 2023-12-05 ENCOUNTER — Other Ambulatory Visit: Payer: Self-pay | Admitting: Nurse Practitioner

## 2023-12-05 DIAGNOSIS — M810 Age-related osteoporosis without current pathological fracture: Secondary | ICD-10-CM | POA: Diagnosis not present

## 2023-12-05 DIAGNOSIS — Z880 Allergy status to penicillin: Secondary | ICD-10-CM | POA: Diagnosis not present

## 2023-12-05 DIAGNOSIS — Z981 Arthrodesis status: Secondary | ICD-10-CM | POA: Diagnosis not present

## 2023-12-05 DIAGNOSIS — M7918 Myalgia, other site: Secondary | ICD-10-CM | POA: Diagnosis not present

## 2023-12-05 DIAGNOSIS — K219 Gastro-esophageal reflux disease without esophagitis: Secondary | ICD-10-CM | POA: Diagnosis not present

## 2023-12-05 DIAGNOSIS — Z888 Allergy status to other drugs, medicaments and biological substances status: Secondary | ICD-10-CM | POA: Diagnosis not present

## 2023-12-05 DIAGNOSIS — Z7983 Long term (current) use of bisphosphonates: Secondary | ICD-10-CM | POA: Diagnosis not present

## 2023-12-05 DIAGNOSIS — G8929 Other chronic pain: Secondary | ICD-10-CM | POA: Diagnosis not present

## 2023-12-05 DIAGNOSIS — M4856XD Collapsed vertebra, not elsewhere classified, lumbar region, subsequent encounter for fracture with routine healing: Secondary | ICD-10-CM | POA: Diagnosis not present

## 2023-12-05 DIAGNOSIS — Z79899 Other long term (current) drug therapy: Secondary | ICD-10-CM | POA: Diagnosis not present

## 2023-12-05 DIAGNOSIS — Z882 Allergy status to sulfonamides status: Secondary | ICD-10-CM | POA: Diagnosis not present

## 2023-12-05 DIAGNOSIS — F32A Depression, unspecified: Secondary | ICD-10-CM | POA: Diagnosis not present

## 2023-12-05 DIAGNOSIS — M47817 Spondylosis without myelopathy or radiculopathy, lumbosacral region: Secondary | ICD-10-CM | POA: Diagnosis not present

## 2023-12-05 DIAGNOSIS — S32010D Wedge compression fracture of first lumbar vertebra, subsequent encounter for fracture with routine healing: Secondary | ICD-10-CM | POA: Diagnosis not present

## 2023-12-05 DIAGNOSIS — M4316 Spondylolisthesis, lumbar region: Secondary | ICD-10-CM | POA: Diagnosis not present

## 2023-12-05 DIAGNOSIS — M797 Fibromyalgia: Secondary | ICD-10-CM | POA: Diagnosis not present

## 2023-12-06 NOTE — Telephone Encounter (Signed)
Requested medication (s) are due for refill today: yes  Requested medication (s) are on the active medication list: yes   Last refill:  09/19/23 #22 g 0 refills   Future visit scheduled: yes in 3 weeks   Notes to clinic:  medication not assigned to a protocol. Do you want to refill Rx?     Requested Prescriptions  Pending Prescriptions Disp Refills   mupirocin ointment (BACTROBAN) 2 % [Pharmacy Med Name: MUPIROCIN 2% OINTMENT] 22 g 0    Sig: Apply 1 Application topically 2 (two) times daily.     Off-Protocol Failed - 12/06/2023  9:27 AM      Failed - Medication not assigned to a protocol, review manually.      Passed - Valid encounter within last 12 months    Recent Outpatient Visits           1 month ago Depression, major, single episode, mild (HCC)   [place] [facility] [place], [place] T, NP   2 months ago Depression, major, single episode, mild (HCC)   [place] [facility] [place], Corrie Dandy T, NP   3 months ago Non-traumatic compression fracture of L1 lumbar vertebra, sequela   [place] [facility] [place], Erin E, PA-C   5 months ago Depression, major, single episode, mild (HCC)   [place] [facility] [place], Corrie Dandy T, NP   9 months ago Hypothyroidism, unspecified type   [place] [facility] [place], Dorie Rank, NP       Future Appointments             In 3 weeks [place], Dorie Rank, NP [place] [facility]

## 2023-12-31 ENCOUNTER — Encounter: Payer: Self-pay | Admitting: Nurse Practitioner

## 2023-12-31 ENCOUNTER — Encounter: Payer: PPO | Admitting: Nurse Practitioner

## 2023-12-31 DIAGNOSIS — I10 Essential (primary) hypertension: Secondary | ICD-10-CM

## 2023-12-31 DIAGNOSIS — E7800 Pure hypercholesterolemia, unspecified: Secondary | ICD-10-CM

## 2023-12-31 DIAGNOSIS — Z1231 Encounter for screening mammogram for malignant neoplasm of breast: Secondary | ICD-10-CM

## 2023-12-31 DIAGNOSIS — Z0000 Encounter for general adult medical examination without abnormal findings: Secondary | ICD-10-CM

## 2023-12-31 DIAGNOSIS — E039 Hypothyroidism, unspecified: Secondary | ICD-10-CM

## 2023-12-31 DIAGNOSIS — M810 Age-related osteoporosis without current pathological fracture: Secondary | ICD-10-CM

## 2023-12-31 DIAGNOSIS — K219 Gastro-esophageal reflux disease without esophagitis: Secondary | ICD-10-CM

## 2023-12-31 DIAGNOSIS — M797 Fibromyalgia: Secondary | ICD-10-CM

## 2023-12-31 DIAGNOSIS — F320 Major depressive disorder, single episode, mild: Secondary | ICD-10-CM

## 2023-12-31 DIAGNOSIS — M791 Myalgia, unspecified site: Secondary | ICD-10-CM

## 2023-12-31 DIAGNOSIS — Z6831 Body mass index (BMI) 31.0-31.9, adult: Secondary | ICD-10-CM

## 2024-01-08 DIAGNOSIS — Z08 Encounter for follow-up examination after completed treatment for malignant neoplasm: Secondary | ICD-10-CM | POA: Diagnosis not present

## 2024-01-08 DIAGNOSIS — D225 Melanocytic nevi of trunk: Secondary | ICD-10-CM | POA: Diagnosis not present

## 2024-01-08 DIAGNOSIS — D2262 Melanocytic nevi of left upper limb, including shoulder: Secondary | ICD-10-CM | POA: Diagnosis not present

## 2024-01-08 DIAGNOSIS — B351 Tinea unguium: Secondary | ICD-10-CM | POA: Diagnosis not present

## 2024-01-08 DIAGNOSIS — Z85828 Personal history of other malignant neoplasm of skin: Secondary | ICD-10-CM | POA: Diagnosis not present

## 2024-01-08 DIAGNOSIS — D2261 Melanocytic nevi of right upper limb, including shoulder: Secondary | ICD-10-CM | POA: Diagnosis not present

## 2024-01-08 DIAGNOSIS — D2271 Melanocytic nevi of right lower limb, including hip: Secondary | ICD-10-CM | POA: Diagnosis not present

## 2024-01-08 DIAGNOSIS — D2272 Melanocytic nevi of left lower limb, including hip: Secondary | ICD-10-CM | POA: Diagnosis not present

## 2024-01-08 DIAGNOSIS — L821 Other seborrheic keratosis: Secondary | ICD-10-CM | POA: Diagnosis not present

## 2024-02-01 NOTE — Telephone Encounter (Unsigned)
 Copied from CRM [id number]. Topic: Appointments - Scheduling Inquiry for Clinic >> Feb 01, 2024  9:32 AM Gildardo Pounds wrote: Reason for CRM: Patient is inquiring why no messages are being left for appointment reminders. Confidential VM, please leave a message. Callback number is [phone]

## 2024-02-01 NOTE — Telephone Encounter (Signed)
 Called pt and left message on machine that reminder calls  from the automated system are sent out 5 days before the appointment and if the call is not answered it will call again the day before

## 2024-02-03 NOTE — Patient Instructions (Signed)

## 2024-02-08 ENCOUNTER — Encounter: Payer: Self-pay | Admitting: Nurse Practitioner

## 2024-02-08 ENCOUNTER — Ambulatory Visit (INDEPENDENT_AMBULATORY_CARE_PROVIDER_SITE_OTHER): Payer: PPO | Admitting: Nurse Practitioner

## 2024-02-08 VITALS — BP 130/84 | HR 75 | Temp 97.6°F | Ht 62.0 in | Wt 161.8 lb

## 2024-02-08 DIAGNOSIS — M791 Myalgia, unspecified site: Secondary | ICD-10-CM

## 2024-02-08 DIAGNOSIS — Z0000 Encounter for general adult medical examination without abnormal findings: Secondary | ICD-10-CM | POA: Diagnosis not present

## 2024-02-08 DIAGNOSIS — E039 Hypothyroidism, unspecified: Secondary | ICD-10-CM | POA: Diagnosis not present

## 2024-02-08 DIAGNOSIS — E7800 Pure hypercholesterolemia, unspecified: Secondary | ICD-10-CM | POA: Diagnosis not present

## 2024-02-08 DIAGNOSIS — I10 Essential (primary) hypertension: Secondary | ICD-10-CM

## 2024-02-08 DIAGNOSIS — E66811 Obesity, class 1: Secondary | ICD-10-CM | POA: Diagnosis not present

## 2024-02-08 DIAGNOSIS — Z1231 Encounter for screening mammogram for malignant neoplasm of breast: Secondary | ICD-10-CM

## 2024-02-08 DIAGNOSIS — E559 Vitamin D deficiency, unspecified: Secondary | ICD-10-CM | POA: Diagnosis not present

## 2024-02-08 DIAGNOSIS — M797 Fibromyalgia: Secondary | ICD-10-CM

## 2024-02-08 DIAGNOSIS — F320 Major depressive disorder, single episode, mild: Secondary | ICD-10-CM

## 2024-02-08 DIAGNOSIS — M810 Age-related osteoporosis without current pathological fracture: Secondary | ICD-10-CM

## 2024-02-08 DIAGNOSIS — K219 Gastro-esophageal reflux disease without esophagitis: Secondary | ICD-10-CM

## 2024-02-08 DIAGNOSIS — E6609 Other obesity due to excess calories: Secondary | ICD-10-CM | POA: Diagnosis not present

## 2024-02-08 NOTE — Assessment & Plan Note (Signed)
Chronic, ongoing, tolerating Fosamax.  Continue this and reviewed recent DEXA with her, noted some improvement in scores.  Repeat DEXA around 12/12/24.

## 2024-02-08 NOTE — Progress Notes (Signed)
 BP 130/84 (BP Location: Left Arm, Cuff Size: Normal)   Pulse 75   Temp 97.6 F (36.4 C) (Oral)   Ht 5\' 2"  (1.575 m)   Wt 161 lb 12.8 oz (73.4 kg)   SpO2 97%   BMI 29.59 kg/m    Subjective:    Patient ID: Angela Martinez, female    DOB: [birth year], 73 y.o.   MRN: [id number]  HPI: Angela Martinez is a 73 y.o. female presenting on 02/08/2024 for comprehensive medical examination and follow-up. Current medical complaints include:none  She currently lives with: husband Menopausal Symptoms: no  Husband present at bedside with patient.     HYPERTENSION / HYPERLIPIDEMIA Currently she takes no blood pressure medications.  Has had poor tolerance to Crestor and Lipitor, including on 1 and 3 day a week schedules + could not tolerate Zetia had severe cramps with this. Going to see lipid clinic on April 15th.   Fibromyalgia at baseline, statins and Zetia made pain worse. Taking Gabapentin 300 MG BID for fibromyalgia and trigeminal neuralgia left side of face. Last saw neurology 03/06/22. No issues with facial pain for over one year. Satisfied with current treatment? no Duration of hypertension: chronic BP monitoring frequency: rarely, but not lately BP range:  BP medication side effects: no Duration of hyperlipidemia: chronic Cholesterol medication side effects: no Cholesterol supplements: none Medication compliance: good compliance Aspirin: no Recent stressors: no Recurrent headaches: no Visual changes: no Palpitations: no Dyspnea: no Chest pain: no Lower extremity edema: no Dizzy/lightheaded: no  The 10-year ASCVD risk score (Arnett DK, et al., 2019) is: 16.8%   Values used to calculate the score:     Age: 35 years     Sex: Female     Is Non-Hispanic African American: No     Diabetic: No     Tobacco smoker: No     Systolic Blood Pressure: 130 mmHg     Is BP treated: No     HDL Cholesterol: 46 mg/dL     Total Cholesterol: 254 mg/dL    GERD Taking Prilosec, without it symptoms are  worse. GERD control status: controlled  Satisfied with current treatment? yes Heartburn frequency:  Medication side effects: no  Medication compliance: stable Dysphagia: no Odynophagia:  no Hematemesis: no Blood in stool: no EGD: no   HYPOTHYROIDISM Taking Levothyroxine 50 MCG daily. Thyroid control status:stable Satisfied with current treatment? yes Medication side effects: no Medication compliance: good compliance Etiology of hypothyroidism: unknown Recent dose adjustment:no Fatigue: no Cold intolerance: no Heat intolerance: no Weight gain: no Weight loss: no Constipation: no Diarrhea/loose stools: no Palpitations: no Lower extremity edema: no Anxiety/depressed mood: no   OSTEOPOROSIS: DEXA on 12/12/22 noted T-Score -2.5.  Taking Alendronate weekly + Vitamin D. No recent falls. Satisfied with current treatment?: yes Medication side effects: no Medication compliance: good compliance Past osteoporosis medications/treatments:  Adequate calcium & vitamin D: yes Intolerance to bisphosphonates:no Weight bearing exercises: no  DEPRESSION Taking Cymbalta 30 MG daily for mood and pain. Mood status: stable Satisfied with current treatment?: yes Symptom severity: mild  Duration of current treatment : chronic Side effects: no Medication compliance: good compliance Psychotherapy/counseling: none Previous psychiatric medications: Prozac Depressed mood: no Anxious mood: no Anhedonia: no Significant weight loss or gain: no Insomnia: none Fatigue: no Feelings of worthlessness or guilt: no Impaired concentration/indecisiveness: no Suicidal ideations: no Hopelessness: no Crying spells: no    02/08/2024    8:58 AM 10/31/2023   10:57 AM 09/19/2023  3:28 PM 08/23/2023    9:59 AM 06/29/2023    9:29 AM  Depression screen PHQ 2/9  Decreased Interest 1 1 2  0 0  Down, Depressed, Hopeless 1 1 3  0 1  PHQ - 2 Score 2 2 5  0 1  Altered sleeping 1 2 1  0 1  Tired, decreased  energy 1 1 2  0 1  Change in appetite 0 0 0 0 1  Feeling bad or failure about yourself  0 2 1 0 0  Trouble concentrating 0 0 0 0 0  Moving slowly or fidgety/restless 0 0 0 0 1  Suicidal thoughts 0 1 0 0 0  PHQ-9 Score 4 8 9  0 5  Difficult doing work/chores Somewhat difficult Somewhat difficult Somewhat difficult Not difficult at all Somewhat difficult       02/08/2024    8:58 AM 10/31/2023   10:58 AM 09/19/2023    3:34 PM 08/23/2023   10:00 AM  GAD 7 : Generalized Anxiety Score  Nervous, Anxious, on Edge 1 1 1  0  Control/stop worrying 0 0 1 0  Worry too much - different things 0 0 1 0  Trouble relaxing 1 2 0 0  Restless 1 0 1 0  Easily annoyed or irritable 1 1 1  0  Afraid - awful might happen 0 0 1 0  Total GAD 7 Score 4 4 6  0  Anxiety Difficulty Somewhat difficult Somewhat difficult Somewhat difficult Not difficult at all      12/28/2022    8:43 AM 01/18/2023   11:00 AM 06/29/2023    9:30 AM 08/23/2023    9:59 AM 02/08/2024    8:53 AM  Fall Risk  Falls in the past year? 0 0 0 0 0  Was there an injury with Fall? 0 0 0 0 0  Fall Risk Category Calculator 0 0 0 0 0  Patient at Risk for Falls Due to No Fall Risks No Fall Risks No Fall Risks No Fall Risks No Fall Risks  Fall risk Follow up Falls evaluation completed Falls evaluation completed Falls evaluation completed Falls evaluation completed Falls evaluation completed    Functional Status Survey: Is the patient deaf or have difficulty hearing?: No Does the patient have difficulty seeing, even when wearing glasses/contacts?: No Does the patient have difficulty concentrating, remembering, or making decisions?: No Does the patient have difficulty walking or climbing stairs?: No Does the patient have difficulty dressing or bathing?: No Does the patient have difficulty doing errands alone such as visiting a doctor's office or shopping?: No    Past Medical History:  Past Medical History:  Diagnosis Date   Anxiety    Complication  of anesthesia    WITH WRIST SURGERY HARD TO GET RELAXED. OK WITH KNEE / BACK   Cough    Depression    Fibromyalgia    GERD (gastroesophageal reflux disease)    Herpes simplex    1 AND 2   Hyperlipidemia    OA (osteoarthritis)    Osteopenia    Overweight    Plantar fasciitis    Shortness of breath dyspnea    DOE   Tremors of nervous system     Surgical History:  Past Surgical History:  Procedure Laterality Date   BACK SURGERY     BUNIONECTOMY     CATARACT EXTRACTION W/PHACO Right 03/16/2016   Procedure: CATARACT EXTRACTION PHACO AND INTRAOCULAR LENS PLACEMENT (IOC);  Surgeon: Lockie Mola, MD;  Location: [facility];  Service: Ophthalmology;  Laterality:  Right;  [place] 52.4 AP% 8.8 CDE 4.63 Fluid Pack Lot # [id number]   CATARACT EXTRACTION W/PHACO Left 05/18/2016   Procedure: CATARACT EXTRACTION PHACO AND INTRAOCULAR LENS PLACEMENT (IOC);  Surgeon: Lockie Mola, MD;  Location: [facility] OR[facility];  Service: Ophthalmology;  Laterality: Left;  [place] 47.3 AP% 10.4 CDE 4.90 Fluid pack lot # [id number]   DILATION AND CURETTAGE OF UTERUS     x 3   JOINT REPLACEMENT     LUMBAR FUSION     L4-L5 with cage   TOTAL KNEE ARTHROPLASTY Left 2007   WRIST SURGERY      Medications:  Current Outpatient Medications on File Prior to Visit  Medication Sig   alendronate (FOSAMAX) 70 MG tablet Take 1 tablet (70 mg total) by mouth every 7 (seven) days. Take with a full glass of water on an empty stomach.   b complex vitamins capsule Take 1 capsule by mouth daily.   COLLAGEN PO Take 1 tablet by mouth daily.   DULoxetine (CYMBALTA) 30 MG capsule Take 2 tablets (60 MG) by mouth daily, if tolerate alert PCP and will send in 60 MG tablet.   gabapentin (NEURONTIN) 300 MG capsule Take 1 capsule (300 mg total) by mouth 3 (three) times daily.   levothyroxine (SYNTHROID) 50 MCG tablet Take 1 tablet (50 mcg total) by mouth daily.   loratadine (CLARITIN) 10 MG tablet Take 10 mg by mouth daily.   Magnesium 250 MG  TABS Take 1 tablet by mouth daily at 2 PM.   melatonin 5 MG TABS Take 5 mg by mouth at bedtime.   omeprazole (PRILOSEC) 20 MG capsule Take 1 capsule (20 mg total) by mouth daily. NEEDS APPOINTMENT FOR FURTHER REFILLS   pyridOXINE (VITAMIN B6) 100 MG tablet Take 100 mg by mouth daily.   Turmeric 500 MG TABS Take 1 tablet by mouth daily at 2 PM.   [DISCONTINUED] fluticasone (FLONASE) 50 MCG/ACT nasal spray Place 2 sprays into both nostrils daily.   No current facility-administered medications on file prior to visit.    Allergies:  Allergies  Allergen Reactions   Zetia [Ezetimibe]     Muscle cramps severe   Atorvastatin     Muscle cramps   Crestor [Rosuvastatin Calcium]     Muscle aches   Elemental Sulfur    Lyrica [Pregabalin] Other (See Comments)    Spaced out   Amoxicillin-Pot Clavulanate Nausea Only    Social History:  Social History   Socioeconomic History   Marital status: Married    Spouse name: Not on file   Number of children: Not on file   Years of education: Not on file   Highest education level: High school graduate  Occupational History   Not on file  Tobacco Use   Smoking status: Never   Smokeless tobacco: Never  Vaping Use   Vaping status: Never Used  Substance and Sexual Activity   Alcohol use: No   Drug use: No   Sexual activity: Not on file  Other Topics Concern   Not on file  Social History Narrative   Not on file   Social Drivers of Health   Financial Resource Strain: Low Risk  (12/28/2022)   Overall Financial Resource Strain (CARDIA)    Difficulty of Paying Living Expenses: Not hard at all  Food Insecurity: No Food Insecurity (12/28/2022)   Hunger Vital Sign    Worried About Running Out of Food in the Last Year: Never true    Ran Out of Food in  the Last Year: Never true  Transportation Needs: No Transportation Needs (12/28/2022)   PRAPARE - Administrator, Civil Service (Medical): No    Lack of Transportation (Non-Medical): No   Physical Activity: Insufficiently Active (12/28/2022)   Exercise Vital Sign    Days of Exercise per Week: 2 days    Minutes of Exercise per Session: 20 min  Stress: No Stress Concern Present (12/28/2022)   [employer] of Occupational Health - Occupational Stress Questionnaire    Feeling of Stress : Not at all  Social Connections: Moderately Integrated (12/28/2022)   Social Connection and Isolation Panel [NHANES]    Frequency of Communication with Friends and Family: More than three times a week    Frequency of Social Gatherings with Friends and Family: More than three times a week    Attends Religious Services: More than 4 times per year    Active Member of [employer] or Organizations: No    Attends Banker Meetings: Never    Marital Status: Married  Catering manager Violence: Not At Risk (12/28/2022)   Humiliation, Afraid, Rape, and Kick questionnaire    Fear of Current or Ex-Partner: No    Emotionally Abused: No    Physically Abused: No    Sexually Abused: No   Social History   Tobacco Use  Smoking Status Never  Smokeless Tobacco Never   Social History   Substance and Sexual Activity  Alcohol Use No    Family History:  Family History  Problem Relation Age of Onset   Alcohol abuse Father    Alcohol abuse Sister    Arthritis Sister        5 sisters, some with RA   Seizures Sister    Cancer Sister        breast   Cirrhosis Sister    Diabetes Brother    Breast cancer Neg Hx     Past medical history, surgical history, medications, allergies, family history and social history reviewed with patient today and changes made to appropriate areas of the chart.   ROS All other ROS negative except what is listed above and in the HPI.      Objective:    BP 130/84 (BP Location: Left Arm, Cuff Size: Normal)   Pulse 75   Temp 97.6 F (36.4 C) (Oral)   Ht 5\' 2"  (1.575 m)   Wt 161 lb 12.8 oz (73.4 kg)   SpO2 97%   BMI 29.59 kg/m   Wt Readings from Last 3  Encounters:  02/08/24 161 lb 12.8 oz (73.4 kg)  10/31/23 164 lb 6.4 oz (74.6 kg)  09/19/23 167 lb 12.8 oz (76.1 kg)    Physical Exam Vitals and nursing note reviewed. Exam conducted with a chaperone present.  Constitutional:      General: She is awake. She is not in acute distress.    Appearance: She is well-developed and well-groomed. She is not ill-appearing or toxic-appearing.  HENT:     Head: Normocephalic and atraumatic.     Right Ear: Hearing, tympanic membrane, ear canal and external ear normal. No drainage.     Left Ear: Hearing, tympanic membrane, ear canal and external ear normal. No drainage.     Nose: Nose normal.     Right Sinus: No maxillary sinus tenderness or frontal sinus tenderness.     Left Sinus: No maxillary sinus tenderness or frontal sinus tenderness.     Mouth/Throat:     Mouth: Mucous membranes are moist.  Pharynx: Oropharynx is clear. Uvula midline. No pharyngeal swelling, oropharyngeal exudate or posterior oropharyngeal erythema.  Eyes:     General: Lids are normal.        Right eye: No discharge.        Left eye: No discharge.     Extraocular Movements: Extraocular movements intact.     Conjunctiva/sclera: Conjunctivae normal.     Pupils: Pupils are equal, round, and reactive to light.     Visual Fields: Right eye visual fields normal and left eye visual fields normal.  Neck:     Thyroid: No thyromegaly.     Vascular: No carotid bruit.     Trachea: Trachea normal.  Cardiovascular:     Rate and Rhythm: Normal rate and regular rhythm.     Heart sounds: Normal heart sounds. No murmur heard.    No gallop.  Pulmonary:     Effort: Pulmonary effort is normal. No accessory muscle usage or respiratory distress.     Breath sounds: Normal breath sounds.  Chest:  Breasts:    Right: Normal.     Left: Normal.  Abdominal:     General: Bowel sounds are normal.     Palpations: Abdomen is soft. There is no hepatomegaly or splenomegaly.     Tenderness: There  is no abdominal tenderness.  Musculoskeletal:        General: Normal range of motion.     Cervical back: Normal range of motion and neck supple.     Right lower leg: No edema.     Left lower leg: No edema.  Lymphadenopathy:     Head:     Right side of head: No submental, submandibular, tonsillar, preauricular or posterior auricular adenopathy.     Left side of head: No submental, submandibular, tonsillar, preauricular or posterior auricular adenopathy.     Cervical: No cervical adenopathy.     Upper Body:     Right upper body: No supraclavicular, axillary or pectoral adenopathy.     Left upper body: No supraclavicular, axillary or pectoral adenopathy.  Skin:    General: Skin is warm and dry.     Capillary Refill: Capillary refill takes less than 2 seconds.     Findings: No rash.  Neurological:     Mental Status: She is alert and oriented to person, place, and time.     Gait: Gait is intact.     Deep Tendon Reflexes: Reflexes are normal and symmetric.     Reflex Scores:      Brachioradialis reflexes are 2+ on the right side and 2+ on the left side.      Patellar reflexes are 2+ on the right side and 2+ on the left side. Psychiatric:        Attention and Perception: Attention normal.        Mood and Affect: Mood normal.        Speech: Speech normal.        Behavior: Behavior normal. Behavior is cooperative.        Thought Content: Thought content normal.        Judgment: Judgment normal.       12/28/2022    9:04 AM 10/24/2021   10:32 AM 07/26/2018   10:53 AM 07/05/2017    8:46 AM  6CIT Screen  What Year? 0 points 0 points 0 points 0 points  What month? 0 points 0 points 0 points 0 points  What time? 0 points 0 points 0 points 0 points  Count back from 20 0 points 0 points 0 points 0 points  Months in reverse 0 points 0 points 0 points 0 points  Repeat phrase 0 points  2 points 2 points  Total Score 0 points  2 points 2 points   Results for orders placed or performed in visit  on 09/11/23  TSH   Collection Time: 09/11/23  8:36 AM  Result Value Ref Range   TSH 3.120 0.450 - 4.500 uIU/mL  T4, free   Collection Time: 09/11/23  8:36 AM  Result Value Ref Range   Free T4 1.29 0.82 - 1.77 ng/dL  Urine Culture   Collection Time: 09/11/23 11:36 AM   Specimen: Urine   UR  Result Value Ref Range   Urine Culture, Routine Final report (A)    Organism ID, Bacteria Escherichia coli (A)    Antimicrobial Susceptibility Comment   Microscopic Examination   Collection Time: 09/11/23 11:36 AM   Urine  Result Value Ref Range   WBC, UA 11-30 (H) 0 - 5 /hpf   RBC, Urine 0-2 0 - 2 /hpf   Epithelial Cells (non renal) 0-10 0 - 10 /hpf   Bacteria, UA Many (A) None seen/Few  Urinalysis, Routine w reflex microscopic   Collection Time: 09/11/23 11:36 AM  Result Value Ref Range   Specific Gravity, UA 1.015 1.005 - 1.030   pH, UA 8.0 (H) 5.0 - 7.5   Color, UA Yellow Yellow   Appearance Ur Cloudy (A) Clear   Leukocytes,UA 3+ (A) Negative   Protein,UA Negative Negative/Trace   Glucose, UA Negative Negative   Ketones, UA Negative Negative   RBC, UA Trace (A) Negative   Bilirubin, UA Negative Negative   Urobilinogen, Ur 0.2 0.2 - 1.0 mg/dL   Nitrite, UA Positive (A) Negative   Microscopic Examination See below:       Assessment & Plan:   Problem List Items Addressed This Visit       Cardiovascular and Mediastinum   Essential hypertension (Chronic)   Chronic, stable. Currently no medications.  BP at goal in office for age for age.  Continue current diet control and adjust plan as needed based on readings.  Continue to check BP at home regularly and document.  Initiate medication as needed.  Focus on DASH diet.  Labs today: CBC,CMP, TSH.  Return in 6 months.      Relevant Orders   CBC with Differential/Platelet   Comprehensive metabolic panel   TSH     Digestive   GERD (gastroesophageal reflux disease)   Chronic, stable.  Continue Prilosec and adjust dose as needed.   She reports benefit from this medication.  Check Mag level annually. Risks of PPI use were discussed with patient including bone loss, C. Diff diarrhea, pneumonia, infections, CKD, electrolyte abnormalities.  Verbalizes understanding and chooses to continue the medication.       Relevant Orders   Magnesium     Endocrine   Hypothyroidism (Chronic)   Chronic, ongoing.  Continue Levothyroxine as ordered and adjust if needed.  Labs today.      Relevant Orders   TSH   T4, free     Musculoskeletal and Integument   Osteoporosis (Chronic)   Chronic, ongoing, tolerating Fosamax.  Continue this and reviewed recent DEXA with her, noted some improvement in scores.  Repeat DEXA around 12/12/24.      Relevant Orders   VITAMIN D 25 Hydroxy (Vit-D Deficiency, Fractures)     Other   Depression, major,  single episode, mild (HCC) - Primary (Chronic)   Chronic, improving.  Denies SI/HI.  Continue Duloxetine as is seeing benefit.  She is taking Gabapentin, so Duloxetine is beneficial with this.  Educated her on medication and side effects that may present.        Fibromyalgia (Chronic)   Chronic, stable on Gabapentin and Duloxetine.  Continue current medication and adjust as needed.  Return in 6 months.  Monitor kidney function and renal dose Gabapentin as needed.      Hyperlipidemia (Chronic)   Chronic, poor tolerance to statins.  Had trial of Lipitor, Zetia, and Crestor with immediate muscle cramps.  Did not tolerate Zetia, all over muscle pain with this. Saw lipid clinic and CT coronary calcium scoring was 0.        Relevant Orders   Comprehensive metabolic panel   Lipid Panel w/o Chol/HDL Ratio   Myalgia due to statin   Refer to hyperlipidemia plan of care.      Obesity   BMI 29.59.  Recommended eating smaller high protein, low fat meals more frequently and exercising 30 mins a day 5 times a week with a goal of 10-15lb weight loss in the next 3 months. Patient voiced their understanding  and motivation to adhere to these recommendations.       Vitamin D deficiency   Ongoing with osteoporosis.  Continue supplement at home and check Vit D level today.      Relevant Orders   VITAMIN D 25 Hydroxy (Vit-D Deficiency, Fractures)   Other Visit Diagnoses       Encounter for screening mammogram for malignant neoplasm of breast       Mammogram ordered.   Relevant Orders   MM 3D SCREENING MAMMOGRAM BILATERAL BREAST     Encounter for annual physical exam       Annual physical today with labs and health maintenance reviewed, discussed with patient.        Follow up plan: Return in about 6 months (around 08/10/2024) for HTN/HLD, Hypothyroid, Osteoporosis + needs Medicare Wellness with nurse.   LABORATORY TESTING:  - Pap smear: not applicable, although she would like one and will check with insurance as she is concerned since some friends have had ovarian cancer  IMMUNIZATIONS:   - Tdap: Tetanus vaccination status reviewed: refuses today - Influenza: Up to date - Pneumovax: Up to date - Prevnar: Up To Date - COVID: Up to date - HPV: Not applicable - Shingrix vaccine: Refused  SCREENING: -Mammogram: Up to date  - Colonoscopy: Refused -- is 66 - Bone Density: Up to date  -Hearing Test: Not applicable  -Spirometry: Not applicable   PATIENT COUNSELING:   Advised to take 1 mg of folate supplement per day if capable of pregnancy.   Sexuality: Discussed sexually transmitted diseases, partner selection, use of condoms, avoidance of unintended pregnancy  and contraceptive alternatives.   Advised to avoid cigarette smoking.  I discussed with the patient that most people either abstain from alcohol or drink within safe limits (<=14/week and <=4 drinks/occasion for males, <=7/weeks and <= 3 drinks/occasion for females) and that the risk for alcohol disorders and other health effects rises proportionally with the number of drinks per week and how often a drinker exceeds daily  limits.  Discussed cessation/primary prevention of drug use and availability of treatment for abuse.   Diet: Encouraged to adjust caloric intake to maintain  or achieve ideal body weight, to reduce intake of dietary saturated fat and total fat,  to limit sodium intake by avoiding high sodium foods and not adding table salt, and to maintain adequate dietary potassium and calcium preferably from fresh fruits, vegetables, and low-fat dairy products.    Stressed the importance of regular exercise  Injury prevention: Discussed safety belts, safety helmets, smoke detector, smoking near bedding or upholstery.   Dental health: Discussed importance of regular tooth brushing, flossing, and dental visits.    NEXT PREVENTATIVE PHYSICAL DUE IN 1 YEAR. Return in about 6 months (around 08/10/2024) for HTN/HLD, Hypothyroid, Osteoporosis + needs Medicare Wellness with nurse.

## 2024-02-08 NOTE — Assessment & Plan Note (Signed)
BMI 29.59.  Recommended eating smaller high protein, low fat meals more frequently and exercising 30 mins a day 5 times a week with a goal of 10-15lb weight loss in the next 3 months. Patient voiced their understanding and motivation to adhere to these recommendations.  

## 2024-02-08 NOTE — Assessment & Plan Note (Signed)
 Chronic, improving.  Denies SI/HI.  Continue Duloxetine as is seeing benefit.  She is taking Gabapentin, so Duloxetine is beneficial with this.  Educated her on medication and side effects that may present.

## 2024-02-08 NOTE — Assessment & Plan Note (Signed)
 Chronic, poor tolerance to statins.  Had trial of Lipitor, Zetia, and Crestor with immediate muscle cramps.  Did not tolerate Zetia, all over muscle pain with this. Saw lipid clinic and CT coronary calcium scoring was 0.

## 2024-02-08 NOTE — Assessment & Plan Note (Signed)
Refer to hyperlipidemia plan of care.

## 2024-02-08 NOTE — Assessment & Plan Note (Signed)
Ongoing with osteoporosis.  Continue supplement at home and check Vit D level today. 

## 2024-02-08 NOTE — Assessment & Plan Note (Signed)
 Chronic, stable. Currently no medications.  BP at goal in office for age for age.  Continue current diet control and adjust plan as needed based on readings.  Continue to check BP at home regularly and document.  Initiate medication as needed.  Focus on DASH diet.  Labs today: CBC,CMP, TSH.  Return in 6 months.

## 2024-02-08 NOTE — Assessment & Plan Note (Signed)
Chronic, ongoing.  Continue Levothyroxine as ordered and adjust if needed.  Labs today.

## 2024-02-08 NOTE — Assessment & Plan Note (Signed)
Chronic, stable.  Continue Prilosec and adjust dose as needed.  She reports benefit from this medication.  Check Mag level annually. Risks of PPI use were discussed with patient including bone loss, C. Diff diarrhea, pneumonia, infections, CKD, electrolyte abnormalities.  Verbalizes understanding and chooses to continue the medication.

## 2024-02-08 NOTE — Assessment & Plan Note (Signed)
 Chronic, stable on Gabapentin and Duloxetine.  Continue current medication and adjust as needed.  Return in 6 months.  Monitor kidney function and renal dose Gabapentin as needed.

## 2024-02-09 ENCOUNTER — Encounter: Payer: Self-pay | Admitting: Nurse Practitioner

## 2024-02-09 LAB — CBC WITH DIFFERENTIAL/PLATELET
Basophils Absolute: 0 10*3/uL (ref 0.0–0.2)
Basos: 1 %
EOS (ABSOLUTE): 0.2 10*3/uL (ref 0.0–0.4)
Eos: 4 %
Hematocrit: 42.8 % (ref 34.0–46.6)
Hemoglobin: 13.6 g/dL (ref 11.1–15.9)
Immature Grans (Abs): 0 10*3/uL (ref 0.0–0.1)
Immature Granulocytes: 0 %
Lymphocytes Absolute: 2.1 10*3/uL (ref 0.7–3.1)
Lymphs: 39 %
MCH: 26.1 pg — ABNORMAL LOW (ref 26.6–33.0)
MCHC: 31.8 g/dL (ref 31.5–35.7)
MCV: 82 fL (ref 79–97)
Monocytes Absolute: 0.4 10*3/uL (ref 0.1–0.9)
Monocytes: 7 %
Neutrophils Absolute: 2.7 10*3/uL (ref 1.4–7.0)
Neutrophils: 49 %
Platelets: 365 10*3/uL (ref 150–450)
RBC: 5.22 x10E6/uL (ref 3.77–5.28)
RDW: 14.6 % (ref 11.7–15.4)
WBC: 5.5 10*3/uL (ref 3.4–10.8)

## 2024-02-09 LAB — COMPREHENSIVE METABOLIC PANEL
ALT: 13 IU/L (ref 0–32)
AST: 24 IU/L (ref 0–40)
Albumin: 4.3 g/dL (ref 3.8–4.8)
Alkaline Phosphatase: 46 IU/L (ref 44–121)
BUN/Creatinine Ratio: 17 (ref 12–28)
BUN: 15 mg/dL (ref 8–27)
Bilirubin Total: 0.4 mg/dL (ref 0.0–1.2)
CO2: 23 mmol/L (ref 20–29)
Calcium: 9.8 mg/dL (ref 8.7–10.3)
Chloride: 102 mmol/L (ref 96–106)
Creatinine, Ser: 0.88 mg/dL (ref 0.57–1.00)
Globulin, Total: 2.5 g/dL (ref 1.5–4.5)
Glucose: 83 mg/dL (ref 70–99)
Potassium: 4.6 mmol/L (ref 3.5–5.2)
Sodium: 143 mmol/L (ref 134–144)
Total Protein: 6.8 g/dL (ref 6.0–8.5)
eGFR: 68 mL/min/{1.73_m2} (ref >59)

## 2024-02-09 LAB — LIPID PANEL W/O CHOL/HDL RATIO
Cholesterol, Total: 238 mg/dL — ABNORMAL HIGH (ref 100–199)
HDL: 45 mg/dL (ref >39)
LDL Chol Calc (NIH): 165 mg/dL — ABNORMAL HIGH (ref 0–99)
Triglycerides: 153 mg/dL — ABNORMAL HIGH (ref 0–149)
VLDL Cholesterol Cal: 28 mg/dL (ref 5–40)

## 2024-02-09 LAB — T4, FREE: Free T4: 1.32 ng/dL (ref 0.82–1.77)

## 2024-02-09 LAB — MAGNESIUM: Magnesium: 2.4 mg/dL — ABNORMAL HIGH (ref 1.6–2.3)

## 2024-02-09 LAB — TSH: TSH: 1.5 u[IU]/mL (ref 0.450–4.500)

## 2024-02-09 LAB — VITAMIN D 25 HYDROXY (VIT D DEFICIENCY, FRACTURES): Vit D, 25-Hydroxy: 48.8 ng/mL (ref 30.0–100.0)

## 2024-02-09 NOTE — Progress Notes (Signed)
 Contacted via MyChart   Good evening Cora, your labs have returned: - Magnesium is a little low, if taking a supplement for this cut back to a few days a week. - CBC shows no anemia or infection. - Thyroid levels stable, continue current Levothyroxine dosing. - Kidney function, creatinine and eGFR, remains normal, as is liver function, AST and ALT.  - Lipid panel continues to show elevations which we will monitor.  Plan to repeat CT coronary calcium scoring in a few years.  Any questions? Keep being amazing!!  Thank you for allowing me to participate in your care.  I appreciate you. Kindest regards, Loden Laurent

## 2024-03-12 ENCOUNTER — Ambulatory Visit
Admission: RE | Admit: 2024-03-12 | Discharge: 2024-03-12 | Disposition: A | Discharge status: Home / Self Care | Source: Ambulatory Visit | Attending: Nurse Practitioner | Admitting: Nurse Practitioner

## 2024-03-12 DIAGNOSIS — Z1231 Encounter for screening mammogram for malignant neoplasm of breast: Secondary | ICD-10-CM | POA: Diagnosis not present

## 2024-03-13 ENCOUNTER — Other Ambulatory Visit: Payer: Self-pay | Admitting: Nurse Practitioner

## 2024-03-14 NOTE — Telephone Encounter (Signed)
 Requested Prescriptions  Pending Prescriptions Disp Refills   gabapentin  (NEURONTIN ) 300 MG capsule [Pharmacy Med Name: GABAPENTIN  300 MG CAPSULE] 270 capsule 0    Sig: Take 1 capsule (300 mg total) by mouth 3 (three) times daily.     Neurology: Anticonvulsants - gabapentin  Passed - 03/14/2024  9:46 AM      Passed - Cr in normal range and within 360 days    Creatinine, Ser  Date Value Ref Range Status  02/08/2024 0.88 0.57 - 1.00 mg/dL Final         Passed - Completed PHQ-2 or PHQ-9 in the last 360 days      Passed - Valid encounter within last 12 months    Recent Outpatient Visits           1 month ago Depression, major, single episode, mild (HCC)   [place] [facility] [place], Lavelle Posey, NP

## 2024-04-07 ENCOUNTER — Other Ambulatory Visit: Payer: Self-pay | Admitting: Nurse Practitioner

## 2024-04-07 DIAGNOSIS — K219 Gastro-esophageal reflux disease without esophagitis: Secondary | ICD-10-CM

## 2024-04-09 NOTE — Telephone Encounter (Signed)
 Requested Prescriptions  Pending Prescriptions Disp Refills   omeprazole  (PRILOSEC) 20 MG capsule [Pharmacy Med Name: OMEPRAZOLE  DR 20 MG CAPSULE] 90 capsule 0    Sig: Take 1 capsule (20 mg total) by mouth daily.     Gastroenterology: Proton Pump Inhibitors Passed - 04/09/2024 10:23 AM      Passed - Valid encounter within last 12 months    Recent Outpatient Visits           2 months ago Depression, major, single episode, mild (HCC)   [place] [facility] [place], Jolene T, NP               levothyroxine  (SYNTHROID ) 50 MCG tablet [Pharmacy Med Name: LEVOTHYROXINE  50 MCG TABLET] 60 tablet 0    Sig: Take 1 tablet (50 mcg total) by mouth daily.     Endocrinology:  Hypothyroid Agents Passed - 04/09/2024 10:23 AM      Passed - TSH in normal range and within 360 days    TSH  Date Value Ref Range Status  02/08/2024 1.500 0.450 - 4.500 uIU/mL Final         Passed - Valid encounter within last 12 months    Recent Outpatient Visits           2 months ago Depression, major, single episode, mild (HCC)   [place] [facility] [place], Lavelle Posey, NP

## 2024-06-09 ENCOUNTER — Other Ambulatory Visit: Payer: Self-pay | Admitting: Nurse Practitioner

## 2024-06-11 NOTE — Telephone Encounter (Signed)
 Requested Prescriptions  Pending Prescriptions Disp Refills   levothyroxine  (SYNTHROID ) 50 MCG tablet [Pharmacy Med Name: LEVOTHYROXINE  50 MCG TABLET] 60 tablet 0    Sig: Take 1 tablet (50 mcg total) by mouth daily.     Endocrinology:  Hypothyroid Agents Passed - 06/11/2024 11:14 AM      Passed - TSH in normal range and within 360 days    TSH  Date Value Ref Range Status  02/08/2024 1.500 0.450 - 4.500 uIU/mL Final         Passed - Valid encounter within last 12 months    Recent Outpatient Visits           4 months ago Depression, major, single episode, mild (HCC)   [place] [facility] [place], Melanie DASEN, NP

## 2024-06-27 DIAGNOSIS — R21 Rash and other nonspecific skin eruption: Secondary | ICD-10-CM | POA: Diagnosis not present

## 2024-07-03 ENCOUNTER — Other Ambulatory Visit: Payer: Self-pay | Admitting: Nurse Practitioner

## 2024-07-03 DIAGNOSIS — K219 Gastro-esophageal reflux disease without esophagitis: Secondary | ICD-10-CM

## 2024-07-07 NOTE — Telephone Encounter (Signed)
 Requested Prescriptions  Pending Prescriptions Disp Refills   omeprazole  (PRILOSEC) 20 MG capsule [Pharmacy Med Name: OMEPRAZOLE  DR 20 MG CAPSULE] 90 capsule 0    Sig: Take 1 capsule (20 mg total) by mouth daily.     Gastroenterology: Proton Pump Inhibitors Passed - 07/07/2024 11:36 AM      Passed - Valid encounter within last 12 months    Recent Outpatient Visits           5 months ago Depression, major, single episode, mild (HCC)   [place] [facility] [place], Melanie DASEN, NP

## 2024-07-29 ENCOUNTER — Ambulatory Visit: Admitting: Emergency Medicine

## 2024-07-29 VITALS — Ht 62.0 in | Wt 161.0 lb

## 2024-07-29 DIAGNOSIS — Z0000 Encounter for general adult medical examination without abnormal findings: Secondary | ICD-10-CM

## 2024-07-29 NOTE — Patient Instructions (Signed)
 [name],  Thank you for taking the time for your Medicare Wellness Visit. I appreciate your continued commitment to your health goals. Please review the care plan we discussed, and feel free to reach out if I can assist you further.  Medicare recommends these wellness visits once per year to help you and your care team stay ahead of potential health issues. These visits are designed to focus on prevention, allowing your provider to concentrate on managing your acute and chronic conditions during your regular appointments.  Please note that Annual Wellness Visits do not include a physical exam. Some assessments may be limited, especially if the visit was conducted virtually. If needed, we may recommend a separate in-person follow-up with your provider.  Ongoing Care Seeing your primary care provider every 3 to 6 months helps us  monitor your health and provide consistent, personalized care.   Referrals If a referral was made during today's visit and you haven't received any updates within two weeks, please contact the referred provider directly to check on the status.  Recommended Screenings: Get a Tetanus, flu & Covid vaccines at your local pharmacy at your convenience.  Health Maintenance  Topic Date Due   Zoster (Shingles) Vaccine (1 of 2) Never done   Flu Shot  06/20/2024   COVID-19 Vaccine (8 - Moderna risk 2024-25 season) 07/21/2024   Colon Cancer Screening  09/18/2024*   DEXA scan (bone density measurement)  12/12/2024   Mammogram  03/12/2025   Medicare Annual Wellness Visit  07/29/2025   Pneumococcal Vaccine for age over 54  Completed   Hepatitis C Screening  Completed   HPV Vaccine  Aged Out   Meningitis B Vaccine  Aged Out   DTaP/Tdap/Td vaccine  Discontinued  *Topic was postponed. The date shown is not the original due date.       07/29/2024    9:38 AM  Advanced Directives  Does Patient Have a Medical Advance Directive? No  Would patient like information on creating a  medical advance directive? Yes (MAU/Ambulatory/Procedural Areas - Information given)   Advance Care Planning is important because it: Ensures you receive medical care that aligns with your values, goals, and preferences. Provides guidance to your family and loved ones, reducing the emotional burden of decision-making during critical moments.  Vision: Annual vision screenings are recommended for early detection of glaucoma, cataracts, and diabetic retinopathy. These exams can also reveal signs of chronic conditions such as diabetes and high blood pressure.  Dental: Annual dental screenings help detect early signs of oral cancer, gum disease, and other conditions linked to overall health, including heart disease and diabetes.  Please see the attached documents for additional preventive care recommendations.   Fall Prevention in the Home, Adult Falls can cause injuries and affect people of all ages. There are many simple things that you can do to make your home safe and to help prevent falls. If you need it, ask for help making these changes. What actions can I take to prevent falls? General information Use good lighting in all rooms. Make sure to: Replace any light bulbs that burn out. Turn on lights if it is dark and use night-lights. Keep items that you use often in easy-to-reach places. Lower the shelves around your home if needed. Move furniture so that there are clear paths around it. Do not keep throw rugs or other things on the floor that can make you trip. If any of your floors are uneven, fix them. Add color or contrast paint  or tape to clearly mark and help you see: Grab bars or handrails. First and last steps of staircases. Where the edge of each step is. If you use a ladder or stepladder: Make sure that it is fully opened. Do not climb a closed ladder. Make sure the sides of the ladder are locked in place. Have someone hold the ladder while you use it. Know where your pets  are as you move through your home. What can I do in the bathroom?     Keep the floor dry. Clean up any water that is on the floor right away. Remove soap buildup in the bathtub or shower. Buildup makes bathtubs and showers slippery. Use non-skid mats or decals on the floor of the bathtub or shower. Attach bath mats securely with double-sided, non-slip rug tape. If you need to sit down while you are in the shower, use a non-slip stool. Install grab bars by the toilet and in the bathtub and shower. Do not use towel bars as grab bars. What can I do in the bedroom? Make sure that you have a light by your bed that is easy to reach. Do not use any sheets or blankets on your bed that hang to the floor. Have a firm bench or chair with side arms that you can use for support when you get dressed. What can I do in the kitchen? Clean up any spills right away. If you need to reach something above you, use a sturdy step stool that has a grab bar. Keep electrical cables out of the way. Do not use floor polish or wax that makes floors slippery. What can I do with my stairs? Do not leave anything on the stairs. Make sure that you have a light switch at the top and the bottom of the stairs. Have them installed if you do not have them. Make sure that there are handrails on both sides of the stairs. Fix handrails that are broken or loose. Make sure that handrails are as long as the staircases. Install non-slip stair treads on all stairs in your home if they do not have carpet. Avoid having throw rugs at the top or bottom of stairs, or secure the rugs with carpet tape to prevent them from moving. Choose a carpet design that does not hide the edge of steps on the stairs. Make sure that carpet is firmly attached to the stairs. Fix any carpet that is loose or worn. What can I do on the outside of my home? Use bright outdoor lighting. Repair the edges of walkways and driveways and fix any cracks. Clear paths of  anything that can make you trip, such as tools or rocks. Add color or contrast paint or tape to clearly mark and help you see high doorway thresholds. Trim any bushes or trees on the main path into your home. Check that handrails are securely fastened and in good repair. Both sides of all steps should have handrails. Install guardrails along the edges of any raised decks or porches. Have leaves, snow, and ice cleared regularly. Use sand, salt, or ice melt on walkways during winter months if you live where there is ice and snow. In the garage, clean up any spills right away, including grease or oil spills. What other actions can I take? Review your medicines with your health care provider. Some medicines can make you confused or feel dizzy. This can increase your chance of falling. Wear closed-toe shoes that fit well and support your  feet. Wear shoes that have rubber soles and low heels. Use a cane, walker, scooter, or crutches that help you move around if needed. Talk with your provider about other ways that you can decrease your risk of falls. This may include seeing a physical therapist to learn to do exercises to improve movement and strength. Where to find more information Centers for Disease Control and Prevention, [employer]: TonerPromos.no [employer] on Aging: BaseRingTones.pl National Institute on Aging: BaseRingTones.pl Contact a health care provider if: You are afraid of falling at home. You feel weak, drowsy, or dizzy at home. You fall at home. Get help right away if you: Lose consciousness or have trouble moving after a fall. Have a fall that causes a head injury. These symptoms may be an emergency. Get help right away. Call 911. Do not wait to see if the symptoms will go away. Do not drive yourself to the hospital. This information is not intended to replace advice given to you by your health care provider. Make sure you discuss any questions you have with your health care  provider. Document Revised: 07/10/2022 Document Reviewed: 07/10/2022 Elsevier Patient Education  2024 [employer].

## 2024-07-29 NOTE — Progress Notes (Signed)
 Subjective:   Angela Martinez is a 73 y.o. who presents for a Medicare Wellness preventive visit.  As a reminder, Annual Wellness Visits don't include a physical exam, and some assessments may be limited, especially if this visit is performed virtually. We may recommend an in-person follow-up visit with your provider if needed.  Visit Complete: Virtual I connected with  Janaki J Headrick on 07/29/24 by a audio enabled telemedicine application and verified that I am speaking with the correct person using two identifiers.  Patient Location: Home  Provider Location: Office/Clinic  I discussed the limitations of evaluation and management by telemedicine. The patient expressed understanding and agreed to proceed.  Vital Signs: Because this visit was a virtual/telehealth visit, some criteria may be missing or patient reported. Any vitals not documented were not able to be obtained and vitals that have been documented are patient reported.  VideoDeclined- This patient declined Librarian, academic. Therefore the visit was completed with audio only.  Persons Participating in Visit: Patient.  AWV Questionnaire: No: Patient Medicare AWV questionnaire was not completed prior to this visit.  Cardiac Risk Factors include: advanced age (>33men, >52 women);dyslipidemia;hypertension     Objective:    Today's Vitals   07/29/24 0913 07/29/24 0914  Weight: 161 lb (73 kg)   Height: 5' 2 (1.575 m)   PainSc:  6    Body mass index is 29.45 kg/m.     07/29/2024    9:38 AM 08/16/2023   10:32 AM 10/24/2021   10:11 AM 07/26/2018   10:59 AM 07/05/2017    8:37 AM 05/18/2016    7:01 AM 03/16/2016    8:24 AM  Advanced Directives  Does Patient Have a Medical Advance Directive? No No No No  No  No  No   Would patient like information on creating a medical advance directive? Yes (MAU/Ambulatory/Procedural Areas - Information given)  No - Patient declined Yes (MAU/Ambulatory/Procedural Areas -  Information given)  Yes (MAU/Ambulatory/Procedural Areas - Information given)  No - patient declined information  Yes - Educational materials given      Data saved with a previous flowsheet row definition    Current Medications (verified) Outpatient Encounter Medications as of 07/29/2024  Medication Sig   acetaminophen  (TYLENOL ) 650 MG CR tablet Take 1,300 mg by mouth every 8 (eight) hours as needed for pain.   alendronate  (FOSAMAX ) 70 MG tablet Take 1 tablet (70 mg total) by mouth every 7 (seven) days. Take with a full glass of water on an empty stomach.   b complex vitamins capsule Take 1 capsule by mouth daily.   COLLAGEN PO Take 1 tablet by mouth daily.   DULoxetine  (CYMBALTA ) 30 MG capsule Take 2 tablets (60 MG) by mouth daily, if tolerate alert PCP and will send in 60 MG tablet.   gabapentin  (NEURONTIN ) 300 MG capsule Take 1 capsule (300 mg total) by mouth 3 (three) times daily.   levothyroxine  (SYNTHROID ) 50 MCG tablet Take 1 tablet (50 mcg total) by mouth daily.   loratadine (CLARITIN) 10 MG tablet Take 10 mg by mouth daily.   Magnesium 250 MG TABS Take 1 tablet by mouth daily at 2 PM.   methocarbamol  (ROBAXIN ) 750 MG tablet Take 750 mg by mouth 2 (two) times daily as needed.   omeprazole  (PRILOSEC) 20 MG capsule Take 1 capsule (20 mg total) by mouth daily.   pyridOXINE (VITAMIN B6) 100 MG tablet Take 100 mg by mouth daily.   Turmeric 500 MG  TABS Take 1 tablet by mouth daily at 2 PM.   melatonin 5 MG TABS Take 5 mg by mouth at bedtime. (Patient not taking: Reported on 08-23-2024)   [DISCONTINUED] fluticasone  (FLONASE ) 50 MCG/ACT nasal spray Place 2 sprays into both nostrils daily.   No facility-administered encounter medications on file as of 08/23/24.    Allergies (verified) Zetia  [ezetimibe ], Atorvastatin , Crestor  [rosuvastatin  calcium ], Elemental sulfur, Lyrica [pregabalin], and Amoxicillin -pot clavulanate   History: Past Medical History:  Diagnosis Date   Anxiety     Complication of anesthesia    WITH WRIST SURGERY HARD TO GET RELAXED. OK WITH KNEE / BACK   Cough    Depression    Fibromyalgia    GERD (gastroesophageal reflux disease)    Herpes simplex    1 AND 2   Hyperlipidemia    OA (osteoarthritis)    Osteopenia    Overweight    Plantar fasciitis    Shortness of breath dyspnea    DOE   Tremors of nervous system    Past Surgical History:  Procedure Laterality Date   BACK SURGERY     BUNIONECTOMY     CATARACT EXTRACTION W/PHACO Right 03/16/2016   Procedure: CATARACT EXTRACTION PHACO AND INTRAOCULAR LENS PLACEMENT (IOC);  Surgeon: Dene Etienne, MD;  Location: [facility] ORS;  Service: Ophthalmology;  Laterality: Right;  US  52.4 AP% 8.8 CDE 4.63 Fluid Pack Lot # [id number]   CATARACT EXTRACTION W/PHACO Left 05/18/2016   Procedure: CATARACT EXTRACTION PHACO AND INTRAOCULAR LENS PLACEMENT (IOC);  Surgeon: Dene Etienne, MD;  Location: [facility] ORS;  Service: Ophthalmology;  Laterality: Left;  US  47.3 AP% 10.4 CDE 4.90 Fluid pack lot # [id number]   DILATION AND CURETTAGE OF UTERUS     x 3   JOINT REPLACEMENT     LUMBAR FUSION     L4-L5 with cage   TOTAL KNEE ARTHROPLASTY Left 2007   WRIST SURGERY     Family History  Problem Relation Age of Onset   Alcohol abuse Father    Heart attack Father    Alcohol abuse Sister    Arthritis Sister        5 sisters, some with RA   Seizures Sister    Cancer Sister        breast   Cirrhosis Sister    Diabetes Brother    Breast cancer Neg Hx    Social History   Socioeconomic History   Marital status: Married    Spouse name: Ubaldo   Number of children: 1   Years of education: Not on file   Highest education level: High school graduate  Occupational History   Not on file  Tobacco Use   Smoking status: Never   Smokeless tobacco: Never  Vaping Use   Vaping status: Never Used  Substance and Sexual Activity   Alcohol use: No   Drug use: No   Sexual activity: Not on file  Other Topics  Concern   Not on file  Social History Narrative   2024-08-23 1 biological child deceased, 2 step-children/pbt   Social Drivers of Health   Financial Resource Strain: Low Risk  (08/23/24)   Overall Financial Resource Strain (CARDIA)    Difficulty of Paying Living Expenses: Not hard at all  Food Insecurity: No Food Insecurity (2024/08/23)   Hunger Vital Sign    Worried About Running Out of Food in the Last Year: Never true    Ran Out of Food in the Last Year: Never true  Transportation Needs:  No Transportation Needs (07/29/2024)   PRAPARE - Administrator, Civil Service (Medical): No    Lack of Transportation (Non-Medical): No  Physical Activity: Inactive (07/29/2024)   Exercise Vital Sign    Days of Exercise per Week: 0 days    Minutes of Exercise per Session: 0 min  Stress: No Stress Concern Present (07/29/2024)   [employer] of Occupational Health - Occupational Stress Questionnaire    Feeling of Stress: Only a little  Social Connections: Moderately Integrated (07/29/2024)   Social Connection and Isolation Panel    Frequency of Communication with Friends and Family: More than three times a week    Frequency of Social Gatherings with Friends and Family: Twice a week    Attends Religious Services: More than 4 times per year    Active Member of [employer] or Organizations: No    Attends Engineer, structural: Never    Marital Status: Married    Tobacco Counseling Counseling given: Not Answered    Clinical Intake:  Pre-visit preparation completed: Yes  Pain : 0-10 Pain Score: 6  Pain Type: Acute pain Pain Location: Shoulder Pain Orientation: Right Pain Descriptors / Indicators: Aching Pain Onset: In the past 7 days     BMI - recorded: 29.45 Nutritional Status: BMI 25 -29 Overweight Nutritional Risks: Nausea/ vomitting/ diarrhea, Other (Comment) (nausea and constipation) Diabetes: No  No results found for: HGBA1C   How often do you need to have someone  help you when you read instructions, pamphlets, or other written materials from your doctor or pharmacy?: 1 - Never  Interpreter Needed?: No  Information entered by :: Vina Ned, CMA   Activities of Daily Living     07/29/2024    9:18 AM 02/08/2024    5:10 PM  In your present state of health, do you have any difficulty performing the following activities:  Hearing? 0 0  Vision? 0 0  Difficulty concentrating or making decisions? 0 0  Walking or climbing stairs? 1 0  Comment uses a cane   Dressing or bathing? 0 0  Doing errands, shopping? 0 0  Comment can go by herself, but prefers husband to go with her   Preparing Food and eating ? N   Using the Toilet? N   In the past six months, have you accidently leaked urine? Y   Do you have problems with loss of bowel control? N   Managing your Medications? Y   Comment husband give patient her medications   Managing your Finances? N   Housekeeping or managing your Housekeeping? N     Patient Care Team: Cannady, Jolene T, NP as PCP - General (Nurse Practitioner) Mona Vinie BROCKS, MD as PCP - Cardiology (Cardiology) Herminio Miu, MD (Unknown Physician Specialty) Mittie Gaskin, MD as Referring Physician (Ophthalmology)  I have updated your Care Teams any recent Medical Services you may have received from other providers in the past year.     Assessment:   This is a routine wellness examination for Angela Martinez.  Hearing/Vision screen Hearing Screening - Comments:: Denies hearing loss  Vision Screening - Comments:: Gets routine eye exams, Dr. Mittie Jacobs [place]   Goals Addressed             This Visit's Progress    patient stated       Walk more       Depression Screen     07/29/2024    9:33 AM 02/08/2024    8:58 AM 10/31/2023  10:57 AM 09/19/2023    3:28 PM 08/23/2023    9:59 AM 06/29/2023    9:29 AM 02/13/2023    9:10 AM  PHQ 2/9 Scores  PHQ - 2 Score 2 2 2 5  0 1 2  PHQ- 9 Score 4 4 8 9  0 5 5    Fall  Risk     07/29/2024    9:39 AM 02/08/2024    8:53 AM 08/23/2023    9:59 AM 06/29/2023    9:30 AM 01/18/2023   11:00 AM  Fall Risk   Falls in the past year? 1 0 0 0 0  Number falls in past yr: 0 0 0 0 0  Injury with Fall? 0 0 0 0 0  Risk for fall due to : History of fall(s);Impaired balance/gait;Orthopedic patient;Impaired mobility No Fall Risks No Fall Risks No Fall Risks No Fall Risks  Follow up Falls evaluation completed;Education provided Falls evaluation completed Falls evaluation completed Falls evaluation completed Falls evaluation completed    MEDICARE RISK AT HOME:  Medicare Risk at Home Any stairs in or around the home?: Yes If so, are there any without handrails?: No Home free of loose throw rugs in walkways, pet beds, electrical cords, etc?: Yes Adequate lighting in your home to reduce risk of falls?: Yes Life alert?: No Use of a cane, walker or w/c?: Yes (cane) Grab bars in the bathroom?: Yes Shower chair or bench in shower?: No Elevated toilet seat or a handicapped toilet?: No  TIMED UP AND GO:  Was the test performed?  No  Cognitive Function: 6CIT completed        07/29/2024    9:41 AM 12/28/2022    9:04 AM 10/24/2021   10:32 AM 07/26/2018   10:53 AM 07/05/2017    8:46 AM  6CIT Screen  What Year? 0 points 0 points 0 points 0 points 0 points  What month? 0 points 0 points 0 points 0 points 0 points  What time? 0 points 0 points 0 points 0 points 0 points  Count back from 20 0 points 0 points 0 points 0 points 0 points  Months in reverse 0 points 0 points 0 points 0 points 0 points  Repeat phrase 4 points 0 points  2 points 2 points  Total Score 4 points 0 points  2 points 2 points    Immunizations Immunization History  Administered Date(s) Administered   Fluad Quad(high Dose 65+) 08/29/2022   Influenza,inj,Quad PF,6+ Mos 08/27/2017, 09/08/2021, 10/23/2023   Influenza,inj,quad, With Preservative 09/18/2018, 08/20/2019   Influenza-Unspecified 08/20/2014,  09/08/2015, 09/21/2016, 09/14/2018, 09/24/2020   Moderna Covid-19 Fall Seasonal Vaccine 45yrs & older 09/18/2022, 10/31/2023   Moderna Covid-19 Vaccine Bivalent Booster 65yrs & up 10/17/2021   Moderna Sars-Covid-2 Vaccination 01/16/2020, 02/13/2020, 10/18/2020, 05/12/2021   PNEUMOCOCCAL CONJUGATE-20 12/28/2022   Pneumococcal Conjugate-13 12/27/2020   Tdap 07/16/2012    Screening Tests Health Maintenance  Topic Date Due   Zoster Vaccines- Shingrix (1 of 2) Never done   Influenza Vaccine  06/20/2024   COVID-19 Vaccine (8 - Moderna risk 2024-25 season) 07/21/2024   Colonoscopy  09/18/2024 (Originally 04/27/2021)   DEXA SCAN  12/12/2024   MAMMOGRAM  03/12/2025   Medicare Annual Wellness (AWV)  07/29/2025   Pneumococcal Vaccine: 50+ Years  Completed   Hepatitis C Screening  Completed   HPV VACCINES  Aged Out   Meningococcal B Vaccine  Aged Out   DTaP/Tdap/Td  Discontinued    Health Maintenance Items Addressed: See Nurse Notes  at the end of this note  Additional Screening:  Vision Screening: Recommended annual ophthalmology exams for early detection of glaucoma and other disorders of the eye. Is the patient up to date with their annual eye exam?  Yes  Who is the provider or what is the name of the office in which the patient attends annual eye exams? Dr. Mittie, [facility] [place]  Dental Screening: Recommended annual dental exams for proper oral hygiene  Community Resource Referral / Chronic Care Management: CRR required this visit?  No   CCM required this visit?  No   Plan:    I have personally reviewed and noted the following in the patient's chart:   Medical and social history Use of alcohol, tobacco or illicit drugs  Current medications and supplements including opioid prescriptions. Patient is not currently taking opioid prescriptions. Functional ability and status Nutritional status Physical activity Advanced directives List of other physicians Hospitalizations,  surgeries, and ER visits in previous 12 months Vitals Screenings to include cognitive, depression, and falls Referrals and appointments  In addition, I have reviewed and discussed with patient certain preventive protocols, quality metrics, and best practice recommendations. A written personalized care plan for preventive services as well as general preventive health recommendations were provided to patient.   Vina Ned, CMA   07/29/2024   After Visit Summary: (Mail) Due to this being a telephonic visit, the after visit summary with patients personalized plan was offered to patient via mail   Notes:  6 CIT Score - 4 Will get Flu and Covid vaccines (pharmacy) Needs Tdap (pharmacy) Declined Shingrix vaccines Wants to discuss the need for colonoscopy at next OV 08/13/24

## 2024-08-01 DIAGNOSIS — M25511 Pain in right shoulder: Secondary | ICD-10-CM | POA: Diagnosis not present

## 2024-08-10 NOTE — Patient Instructions (Signed)
Be Involved in Caring For Your Health:  Taking Medications When medications are taken as directed, they can greatly improve your health. But if they are not taken as prescribed, they may not work. In some cases, not taking them correctly can be harmful. To help ensure your treatment remains effective and safe, understand your medications and how to take them. Bring your medications to each visit for review by your provider.  Your lab results, notes, and after visit summary will be available on My Chart. We strongly encourage you to use this feature. If lab results are abnormal the clinic will contact you with the appropriate steps. If the clinic does not contact you assume the results are satisfactory. You can always view your results on My Chart. If you have questions regarding your health or results, please contact the clinic during office hours. You can also ask questions on My Chart.  We at [facility] are grateful that you chose [place] to provide your care. We strive to provide evidence-based and compassionate care and are always looking for feedback. If you get a survey from the clinic please complete this so we can hear your opinions.  Eating Plan for Osteoporosis Osteoporosis causes your bones to become weak and brittle. This puts you at greater risk for bone breaks (fractures) from small bumps or falls. Making changes to your diet and increasing your physical activity can help strengthen your bones and improve your overall health. Calcium and vitamin D are nutrients that play an important role in bone health. Vitamin D helps your body use calcium and strengthen bones. It is important to get enough calcium and vitamin D as part of your eating plan for osteoporosis. What are tips for following this plan? Reading food labels Try to get at least 1,000 milligrams (mg) of calcium each day. Look for foods that have at least 50 mg of calcium per serving. Talk with your health care provider  about taking a calcium supplement if you do not get enough calcium from food. Do not have more than 2,500 mg of calcium each day. This is the upper limit for food and nutritional supplements combined. Too much calcium may cause constipation and prevent you from absorbing other important nutrients. Choose foods that contain vitamin D. Take a daily vitamin supplement that contains 800-1,000 international units (IU) of vitamin D. The amount may be different depending on your age, body weight, and where you live. Talk with your dietitian or health care provider about how much vitamin D is right for you. Avoid foods that have more than 300 mg of sodium per serving. Too much sodium can cause your body to lose calcium. Talk with your dietitian or health care provider about how much sodium you are allowed each day. Shopping Do not buy foods with added salt, including: Salted snacks. Rosita Fire. Canned soups. Canned meats. Processed meats, such as bacon or precooked or cured meat like sausages or meat loaves. Smoked fish. Meal planning Eat balanced meals that contain protein foods, fruits and vegetables, and foods rich in calcium and vitamin D. Eat at least 5 servings of fruits and vegetables each day. Eat 5-6 oz (142-170 g) of lean meat, poultry, fish, eggs, or beans each day. Lifestyle Do not use any products that contain nicotine or tobacco, such as cigarettes, e-cigarettes, and chewing tobacco. If you need help quitting, ask your health care provider. If your health care provider recommends that you lose weight: Work with a dietitian to develop an eating  plan that will help you reach your desired weight goal. Exercise for at least 30 minutes a day, 5 or more days a week, or as told by your health care provider. Work with a physical therapist to develop an exercise plan that includes flexibility, balance, and strength exercises. Do not focus only on aerobic exercise. Do not drink alcohol if: Your  health care provider tells you not to drink. You are pregnant, may be pregnant, or are planning to become pregnant. If you drink alcohol: Limit how much you use to: 0-1 drink a day for women. 0-2 drinks a day for men. Be aware of how much alcohol is in your drink. In the [place] one drink equals one 12 oz bottle of beer (355 mL), one 5 oz glass of wine (148 mL), or one 1 oz glass of hard liquor (44 mL). What foods should I eat? Foods high in calcium  Yogurt. Yogurt with fruit. Milk. Evaporated skim milk. Dry milk powder. Calcium-fortified orange juice. Parmesan cheese. Part-skim ricotta cheese. Natural hard cheese. Cream cheese. Cottage cheese. Canned sardines. Canned salmon. Calcium-treated tofu. Calcium-fortified cereal bar. Calcium-fortified cereal. Calcium-fortified graham crackers. Cooked collard greens. Turnip greens. Broccoli. Kale. Almonds. White beans. Corn tortilla. Foods high in vitamin D Cod liver oil. Fatty fish, such as tuna, mackerel, and salmon. Milk. Fortified soy milk. Fortified fruit juice. Yogurt. Margarine. Egg yolks. Foods high in protein Beef. Lamb. Pork tenderloin. Chicken breast. Tuna (canned). Fish fillet. Tofu. Cooked soy beans. Soy patty. Beans (canned or cooked). Cottage cheese. Yogurt. Peanut butter. Pumpkin seeds. Nuts. Sunflower seeds. Hard cheese. Milk or other milk products, such as soy milk. The items listed above may not be a complete list of foods and beverages you can eat. Contact a dietitian for more options. Summary Calcium and vitamin D are nutrients that play an important role in bone health and are an important part of your eating plan for osteoporosis. Eat balanced meals that contain protein foods, fruits and vegetables, and foods rich in calcium and vitamin D. Avoid foods that have more than 300 mg of sodium per serving. Too much sodium can cause your body to lose calcium. Exercise is an important part of prevention and treatment  of osteoporosis. Aim for at least 30 minutes a day, 5 days a week. This information is not intended to replace advice given to you by your health care provider. Make sure you discuss any questions you have with your health care provider. Document Revised: 04/22/2020 Document Reviewed: 04/22/2020 Elsevier Patient Education  2024 [employer].

## 2024-08-11 ENCOUNTER — Other Ambulatory Visit: Payer: Self-pay | Admitting: Nurse Practitioner

## 2024-08-12 NOTE — Telephone Encounter (Signed)
 Requested Prescriptions  Refused Prescriptions Disp Refills   gabapentin  (NEURONTIN ) 300 MG capsule [Pharmacy Med Name: GABAPENTIN  300 MG CAPSULE] 270 capsule 0    Sig: Take 1 capsule (300 mg total) by mouth 3 (three) times daily.     Neurology: Anticonvulsants - gabapentin  Passed - 08/12/2024 11:15 AM      Passed - Cr in normal range and within 360 days    Creatinine, Ser  Date Value Ref Range Status  02/08/2024 0.88 0.57 - 1.00 mg/dL Final         Passed - Completed PHQ-2 or PHQ-9 in the last 360 days      Passed - Valid encounter within last 12 months    Recent Outpatient Visits           6 months ago Depression, major, single episode, mild   [facility] [place] [facility], Melanie DASEN, NP

## 2024-08-13 ENCOUNTER — Ambulatory Visit (INDEPENDENT_AMBULATORY_CARE_PROVIDER_SITE_OTHER): Admitting: Nurse Practitioner

## 2024-08-13 ENCOUNTER — Encounter: Payer: Self-pay | Admitting: Nurse Practitioner

## 2024-08-13 VITALS — BP 127/78 | HR 78 | Temp 98.1°F | Resp 15 | Ht 62.01 in | Wt 163.2 lb

## 2024-08-13 DIAGNOSIS — E039 Hypothyroidism, unspecified: Secondary | ICD-10-CM | POA: Diagnosis not present

## 2024-08-13 DIAGNOSIS — T466X5A Adverse effect of antihyperlipidemic and antiarteriosclerotic drugs, initial encounter: Secondary | ICD-10-CM | POA: Diagnosis not present

## 2024-08-13 DIAGNOSIS — M810 Age-related osteoporosis without current pathological fracture: Secondary | ICD-10-CM | POA: Diagnosis not present

## 2024-08-13 DIAGNOSIS — Z23 Encounter for immunization: Secondary | ICD-10-CM | POA: Diagnosis not present

## 2024-08-13 DIAGNOSIS — T148XXA Other injury of unspecified body region, initial encounter: Secondary | ICD-10-CM | POA: Diagnosis not present

## 2024-08-13 DIAGNOSIS — F320 Major depressive disorder, single episode, mild: Secondary | ICD-10-CM

## 2024-08-13 DIAGNOSIS — E7800 Pure hypercholesterolemia, unspecified: Secondary | ICD-10-CM | POA: Diagnosis not present

## 2024-08-13 DIAGNOSIS — M797 Fibromyalgia: Secondary | ICD-10-CM | POA: Diagnosis not present

## 2024-08-13 DIAGNOSIS — E6609 Other obesity due to excess calories: Secondary | ICD-10-CM

## 2024-08-13 DIAGNOSIS — I10 Essential (primary) hypertension: Secondary | ICD-10-CM | POA: Diagnosis not present

## 2024-08-13 MED ORDER — DULOXETINE HCL 30 MG PO CPEP: ORAL_CAPSULE | ORAL | 3 refills | Status: AC

## 2024-08-13 MED ORDER — CICLOPIROX 8 % EX SOLN: Freq: Every day | CUTANEOUS | 3 refills | Status: AC

## 2024-08-13 MED ORDER — GABAPENTIN 300 MG PO CAPS: 300.0000 mg | ORAL_CAPSULE | Freq: Three times a day (TID) | ORAL | 2 refills | Status: AC

## 2024-08-13 MED ORDER — ALENDRONATE SODIUM 70 MG PO TABS: 70.0000 mg | ORAL_TABLET | ORAL | 11 refills | Status: AC

## 2024-08-13 MED ORDER — METHOCARBAMOL 750 MG PO TABS: 750.0000 mg | ORAL_TABLET | Freq: Two times a day (BID) | ORAL | 2 refills | Status: AC | PRN

## 2024-08-13 MED ORDER — LEVOTHYROXINE SODIUM 50 MCG PO TABS: 50.0000 ug | ORAL_TABLET | Freq: Every day | ORAL | 2 refills | Status: AC

## 2024-08-13 NOTE — Assessment & Plan Note (Signed)
Refer to hyperlipidemia plan of care.

## 2024-08-13 NOTE — Assessment & Plan Note (Addendum)
 Chronic, poor tolerance to statins.  Had trial of Lipitor, Zetia , and Crestor  with immediate muscle cramps.  Did not tolerate Zetia , all over muscle pain with this. Saw lipid clinic and CT coronary calcium  scoring was 0.  They have recommended a PCSK9i, she refuses this today and wishes to focus on diet.

## 2024-08-13 NOTE — Assessment & Plan Note (Signed)
 Chronic, ongoing.  Continue Levothyroxine  as ordered and adjust if needed.  Labs up to date.

## 2024-08-13 NOTE — Assessment & Plan Note (Signed)
 Chronic, ongoing, tolerating Fosamax .  Continue this, recent DEXA noted some improvement in scores.  Repeat DEXA around 12/12/24.

## 2024-08-13 NOTE — Assessment & Plan Note (Signed)
 Chronic, stable. Currently no medications.  BP at goal in office for age for age.  Continue current diet control and adjust plan as needed based on readings.  Continue to check BP at home regularly and document.  Initiate medication as needed.  Focus on DASH diet.  Labs today: CMP.  Return in 6 months.

## 2024-08-13 NOTE — Assessment & Plan Note (Signed)
 Chronic, improving.  Denies SI/HI.  Continue Duloxetine as is seeing benefit.  She is taking Gabapentin, so Duloxetine is beneficial with this.  Educated her on medication and side effects that may present.

## 2024-08-13 NOTE — Assessment & Plan Note (Signed)
 Chronic, stable on Gabapentin and Duloxetine.  Continue current medication and adjust as needed.  Return in 6 months.  Monitor kidney function and renal dose Gabapentin as needed.

## 2024-08-13 NOTE — Progress Notes (Signed)
 BP 127/78 (BP Location: Left Arm)   Pulse 78   Temp 98.1 F (36.7 C) (Oral)   Resp 15   Ht 5' 2.01 (1.575 m)   Wt 163 lb 3.2 oz (74 kg)   SpO2 95%   BMI 29.84 kg/m    Subjective:    Patient ID: Angela Martinez, female    DOB: November 22, 1950, 73 y.o.   MRN: [id number]  HPI: Angela Martinez is a 73 y.o. female  Chief Complaint  Patient presents with   htn/hld    Around average but does feel it gets a little high with she gets anxious.    Hypothyroidism   Osteoporosis   Dysmenorrhea    Hands mostly.    Immunizations    Would like her tetanus vaccine   HYPERTENSION / HYPERLIPIDEMIA Takes no blood pressure medications.  Did not tolerate statins (Atorvastatin  and Rosuvastatin ) even 1 and 3 day a week schedules + did not tolerate Zetia  -- severe cramping. Saw lipid clinic on 03/05/23 and then had CT Cardiac scoring on 03/14/23 with a score of 0.  Did note aortic atherosclerosis. Had further labs with them in August 2024, Dr. Mona did recommend possible PCSK9i due to high LPa and cholesterol. Satisfied with current treatment? no Duration of hypertension: chronic BP monitoring frequency: rarely, but not lately BP range:  BP medication side effects: no Duration of hyperlipidemia: chronic Cholesterol medication side effects: no Cholesterol supplements: none Medication compliance: good compliance Aspirin: no Recent stressors: no Recurrent headaches: no Visual changes: no Palpitations: no Dyspnea: no Chest pain: no Lower extremity edema: no Dizzy/lightheaded: no  The 10-year ASCVD risk score (Arnett DK, et al., 2019) is: 16%   Values used to calculate the score:     Age: 19 years     Clincally relevant sex: Female     Is Non-Hispanic African American: No     Diabetic: No     Tobacco smoker: No     Systolic Blood Pressure: 127 mmHg     Is BP treated: No     HDL Cholesterol: 45 mg/dL     Total Cholesterol: 238 mg/dL    HYPOTHYROIDISM Takes Levothyroxine  50 MCG daily. Thyroid   control status:stable Satisfied with current treatment? yes Medication side effects: no Medication compliance: good compliance Etiology of hypothyroidism: unknown Recent dose adjustment:no Fatigue: no Cold intolerance: no Heat intolerance: no Weight gain: no Weight loss: no Constipation: no Diarrhea/loose stools: no Palpitations: no Lower extremity edema: no Anxiety/depressed mood: no   OSTEOPOROSIS: DEXA on 12/12/22 noted T-Score -2.5.  Takes Alendronate  weekly + Vitamin D . No recent falls. Satisfied with current treatment?: yes Medication side effects: no Medication compliance: good compliance Past osteoporosis medications/treatments:  Adequate calcium  & vitamin D : yes Intolerance to bisphosphonates:no Weight bearing exercises: no  DEPRESSION Taking Cymbalta  30 MG daily for mood and pain. Has fibromyalgia, statins and Zetia  made pain worse. Takes Gabapentin  300 MG BID for fibromyalgia and trigeminal neuralgia left side of face. Saw neurology last 03/06/22, to return as needed.  Has had no facial pain for >2 years. Mood status: stable Satisfied with current treatment?: yes Symptom severity: mild  Duration of current treatment : chronic Side effects: no Medication compliance: good compliance Psychotherapy/counseling: none Previous psychiatric medications: Prozac  Depressed mood: no Anxious mood: no Anhedonia: no Significant weight loss or gain: no Insomnia: none Fatigue: no Feelings of worthlessness or guilt: no Impaired concentration/indecisiveness: no Suicidal ideations: no Hopelessness: no Crying spells: no    07/29/2024  9:33 AM 02/08/2024    8:58 AM 10/31/2023   10:57 AM 09/19/2023    3:28 PM 08/23/2023    9:59 AM  Depression screen PHQ 2/9  Decreased Interest 1 1 1 2  0  Down, Depressed, Hopeless 1 1 1 3  0  PHQ - 2 Score 2 2 2 5  0  Altered sleeping 1 1 2 1  0  Tired, decreased energy 1 1 1 2  0  Change in appetite 0 0 0 0 0  Feeling bad or failure about  yourself  0 0 2 1 0  Trouble concentrating 0 0 0 0 0  Moving slowly or fidgety/restless 0 0 0 0 0  Suicidal thoughts 0 0 1 0 0  PHQ-9 Score 4 4 8 9  0  Difficult doing work/chores Somewhat difficult Somewhat difficult Somewhat difficult Somewhat difficult Not difficult at all       02/08/2024    8:58 AM 10/31/2023   10:58 AM 09/19/2023    3:34 PM 08/23/2023   10:00 AM  GAD 7 : Generalized Anxiety Score  Nervous, Anxious, on Edge 1 1 1  0  Control/stop worrying 0 0 1 0  Worry too much - different things 0 0 1 0  Trouble relaxing 1 2 0 0  Restless 1 0 1 0  Easily annoyed or irritable 1 1 1  0  Afraid - awful might happen 0 0 1 0  Total GAD 7 Score 4 4 6  0  Anxiety Difficulty Somewhat difficult Somewhat difficult Somewhat difficult Not difficult at all   Relevant past medical, surgical, family and social history reviewed and updated as indicated. Interim medical history since our last visit reviewed. Allergies and medications reviewed and updated.  Review of Systems  Constitutional:  Negative for activity change, appetite change, diaphoresis, fatigue and fever.  Respiratory:  Negative for cough, chest tightness and shortness of breath.   Cardiovascular:  Negative for chest pain, palpitations and leg swelling.  Gastrointestinal: Negative.   Neurological: Negative.   Psychiatric/Behavioral:  Positive for sleep disturbance. Negative for decreased concentration, self-injury and suicidal ideas. The patient is nervous/anxious.     Per HPI unless specifically indicated above     Objective:    BP 127/78 (BP Location: Left Arm)   Pulse 78   Temp 98.1 F (36.7 C) (Oral)   Resp 15   Ht 5' 2.01 (1.575 m)   Wt 163 lb 3.2 oz (74 kg)   SpO2 95%   BMI 29.84 kg/m   Wt Readings from Last 3 Encounters:  08/13/24 163 lb 3.2 oz (74 kg)  07/29/24 161 lb (73 kg)  02/08/24 161 lb 12.8 oz (73.4 kg)    Physical Exam Vitals and nursing note reviewed.  Constitutional:      General: She is  awake. She is not in acute distress.    Appearance: She is well-developed and well-groomed. She is not ill-appearing or toxic-appearing.  HENT:     Head: Normocephalic.     Right Ear: Hearing and external ear normal.     Left Ear: Hearing and external ear normal.  Eyes:     General: Lids are normal.        Right eye: No discharge.        Left eye: No discharge.     Conjunctiva/sclera: Conjunctivae normal.     Pupils: Pupils are equal, round, and reactive to light.  Neck:     Thyroid : No thyromegaly.     Vascular: No carotid bruit.  Cardiovascular:  Rate and Rhythm: Normal rate and regular rhythm.     Heart sounds: Normal heart sounds. No murmur heard.    No gallop.  Pulmonary:     Effort: Pulmonary effort is normal. No accessory muscle usage or respiratory distress.     Breath sounds: Normal breath sounds.  Abdominal:     General: Bowel sounds are normal. There is no distension.     Palpations: Abdomen is soft.     Tenderness: There is no abdominal tenderness.  Musculoskeletal:     Cervical back: Normal range of motion and neck supple.     Right lower leg: No edema.     Left lower leg: No edema.  Lymphadenopathy:     Cervical: No cervical adenopathy.  Skin:    General: Skin is warm and dry.  Neurological:     Mental Status: She is alert and oriented to person, place, and time.     Deep Tendon Reflexes: Reflexes are normal and symmetric.     Reflex Scores:      Brachioradialis reflexes are 2+ on the right side and 2+ on the left side.      Patellar reflexes are 2+ on the right side and 2+ on the left side. Psychiatric:        Attention and Perception: Attention normal.        Mood and Affect: Mood normal.        Speech: Speech normal.        Behavior: Behavior normal. Behavior is cooperative.        Thought Content: Thought content normal.    Results for orders placed or performed in visit on 02/08/24  CBC with Differential/Platelet   Collection Time: 02/08/24   9:30 AM  Result Value Ref Range   WBC 5.5 3.4 - 10.8 x10E3/uL   RBC 5.22 3.77 - 5.28 x10E6/uL   Hemoglobin 13.6 11.1 - 15.9 g/dL   Hematocrit 57.1 65.9 - 46.6 %   MCV 82 79 - 97 fL   MCH 26.1 (L) 26.6 - 33.0 pg   MCHC 31.8 31.5 - 35.7 g/dL   RDW 85.3 88.2 - 84.5 %   Platelets 365 150 - 450 x10E3/uL   Neutrophils 49 Not Estab. %   Lymphs 39 Not Estab. %   Monocytes 7 Not Estab. %   Eos 4 Not Estab. %   Basos 1 Not Estab. %   Neutrophils Absolute 2.7 1.4 - 7.0 x10E3/uL   Lymphocytes Absolute 2.1 0.7 - 3.1 x10E3/uL   Monocytes Absolute 0.4 0.1 - 0.9 x10E3/uL   EOS (ABSOLUTE) 0.2 0.0 - 0.4 x10E3/uL   Basophils Absolute 0.0 0.0 - 0.2 x10E3/uL   Immature Granulocytes 0 Not Estab. %   Immature Grans (Abs) 0.0 0.0 - 0.1 x10E3/uL  Comprehensive metabolic panel   Collection Time: 02/08/24  9:30 AM  Result Value Ref Range   Glucose 83 70 - 99 mg/dL   BUN 15 8 - 27 mg/dL   Creatinine, Ser 9.11 0.57 - 1.00 mg/dL   eGFR 68 >40 fO/fpw/8.26   BUN/Creatinine Ratio 17 12 - 28   Sodium 143 134 - 144 mmol/L   Potassium 4.6 3.5 - 5.2 mmol/L   Chloride 102 96 - 106 mmol/L   CO2 23 20 - 29 mmol/L   Calcium  9.8 8.7 - 10.3 mg/dL   Total Protein 6.8 6.0 - 8.5 g/dL   Albumin 4.3 3.8 - 4.8 g/dL   Globulin, Total 2.5 1.5 - 4.5 g/dL   Bilirubin  Total 0.4 0.0 - 1.2 mg/dL   Alkaline Phosphatase 46 44 - 121 IU/L   AST 24 0 - 40 IU/L   ALT 13 0 - 32 IU/L  TSH   Collection Time: 02/08/24  9:30 AM  Result Value Ref Range   TSH 1.500 0.450 - 4.500 uIU/mL  VITAMIN D  25 Hydroxy (Vit-D Deficiency, Fractures)   Collection Time: 02/08/24  9:30 AM  Result Value Ref Range   Vit D, 25-Hydroxy 48.8 30.0 - 100.0 ng/mL  Lipid Panel w/o Chol/HDL Ratio   Collection Time: 02/08/24  9:30 AM  Result Value Ref Range   Cholesterol, Total 238 (H) 100 - 199 mg/dL   Triglycerides 846 (H) 0 - 149 mg/dL   HDL 45 >60 mg/dL   VLDL Cholesterol Cal 28 5 - 40 mg/dL   LDL Chol Calc (NIH) 834 (H) 0 - 99 mg/dL  Magnesium    Collection Time: 02/08/24  9:30 AM  Result Value Ref Range   Magnesium 2.4 (H) 1.6 - 2.3 mg/dL  T4, free   Collection Time: 02/08/24  9:30 AM  Result Value Ref Range   Free T4 1.32 0.82 - 1.77 ng/dL      Assessment & Plan:   Problem List Items Addressed This Visit       Cardiovascular and Mediastinum   Essential hypertension (Chronic)   Chronic, stable. Currently no medications.  BP at goal in office for age for age.  Continue current diet control and adjust plan as needed based on readings.  Continue to check BP at home regularly and document.  Initiate medication as needed.  Focus on DASH diet.  Labs today: CMP.  Return in 6 months.        Endocrine   Hypothyroidism (Chronic)   Chronic, ongoing.  Continue Levothyroxine  as ordered and adjust if needed.  Labs up to date.      Relevant Medications   levothyroxine  (SYNTHROID ) 50 MCG tablet     Musculoskeletal and Integument   Osteoporosis (Chronic)   Chronic, ongoing, tolerating Fosamax .  Continue this, recent DEXA noted some improvement in scores.  Repeat DEXA around 12/12/24.      Relevant Medications   alendronate  (FOSAMAX ) 70 MG tablet     Other   Hyperlipidemia (Chronic)   Chronic, poor tolerance to statins.  Had trial of Lipitor, Zetia , and Crestor  with immediate muscle cramps.  Did not tolerate Zetia , all over muscle pain with this. Saw lipid clinic and CT coronary calcium  scoring was 0.  They have recommended a PCSK9i, she refuses this today and wishes to focus on diet.       Relevant Orders   Comprehensive metabolic panel with GFR   NMR, lipoprofile   Lipoprotein A (LPA)   Fibromyalgia (Chronic)   Chronic, stable on Gabapentin  and Duloxetine .  Continue current medication and adjust as needed.  Return in 6 months.  Monitor kidney function and renal dose Gabapentin  as needed.      Relevant Medications   methocarbamol  (ROBAXIN ) 750 MG tablet   gabapentin  (NEURONTIN ) 300 MG capsule   DULoxetine  (CYMBALTA ) 30 MG  capsule   Depression, major, single episode, mild - Primary (Chronic)   Chronic, improving.  Denies SI/HI.  Continue Duloxetine  as is seeing benefit.  She is taking Gabapentin , so Duloxetine  is beneficial with this.  Educated her on medication and side effects that may present.        Relevant Medications   DULoxetine  (CYMBALTA ) 30 MG capsule   alendronate  (FOSAMAX ) 70  MG tablet   Myalgia due to statin   Refer to hyperlipidemia plan of care.      Other Visit Diagnoses       Puncture wound       By nail on deck to lower left leg, healing but need Tdap.  Provide today.   Relevant Orders   Tdap vaccine greater than or equal to 7yo IM        Follow up plan: Return in about 6 months (around 02/10/2025) for Annual Physical after 01/10/25.

## 2024-08-14 ENCOUNTER — Ambulatory Visit: Payer: Self-pay | Admitting: Nurse Practitioner

## 2024-08-14 LAB — COMPREHENSIVE METABOLIC PANEL WITH GFR
ALT: 18 IU/L (ref 0–32)
AST: 22 IU/L (ref 0–40)
Albumin: 3.9 g/dL (ref 3.8–4.8)
Alkaline Phosphatase: 40 IU/L — ABNORMAL LOW (ref 49–135)
BUN/Creatinine Ratio: 28 (ref 12–28)
BUN: 23 mg/dL (ref 8–27)
Bilirubin Total: 0.5 mg/dL (ref 0.0–1.2)
CO2: 23 mmol/L (ref 20–29)
Calcium: 9.1 mg/dL (ref 8.7–10.3)
Chloride: 102 mmol/L (ref 96–106)
Creatinine, Ser: 0.82 mg/dL (ref 0.57–1.00)
Globulin, Total: 2.4 g/dL (ref 1.5–4.5)
Glucose: 76 mg/dL (ref 70–99)
Potassium: 4.2 mmol/L (ref 3.5–5.2)
Sodium: 139 mmol/L (ref 134–144)
Total Protein: 6.3 g/dL (ref 6.0–8.5)
eGFR: 75 mL/min/1.73 (ref >59)

## 2024-08-14 LAB — NMR, LIPOPROFILE
Cholesterol, Total: 252 mg/dL — ABNORMAL HIGH (ref 100–199)
HDL Particle Number: 42.5 umol/L (ref >=30.5)
HDL-C: 70 mg/dL (ref >39)
LDL Particle Number: 1948 nmol/L — ABNORMAL HIGH (ref <1000)
LDL Size: 21.4 nm (ref >20.5)
LDL-C (NIH Calc): 167 mg/dL — ABNORMAL HIGH (ref 0–99)
LP-IR Score: <25 (ref <=45)
Small LDL Particle Number: 672 nmol/L — ABNORMAL HIGH (ref <=527)
Triglycerides: 88 mg/dL (ref 0–149)

## 2024-08-14 LAB — LIPOPROTEIN A (LPA): Lipoprotein (a): 179.8 nmol/L — ABNORMAL HIGH (ref <75.0)

## 2024-08-14 NOTE — Progress Notes (Signed)
 Contacted via MyChart, but please call as on review does not consistently checked  Good day Mikaya, your labs have returned: - Kidney and liver function are normal. - Lipid levels are showing elevations, I would agree with Dr. Mona about starting an injection to help lower levels and lower your risk for stroke or heart event, this would be something like Repatha or Praluent every two weeks.  Would you like to try one of these? If not I recommend we recheck levels in 6 months and if levels remain high then highly recommend at that time we start medication.  Thoughts? Keep being amazing!!  Thank you for allowing me to participate in your care.  I appreciate you. Kindest regards, Maude Hettich

## 2024-08-15 ENCOUNTER — Telehealth: Payer: Self-pay

## 2024-08-15 ENCOUNTER — Other Ambulatory Visit (HOSPITAL_COMMUNITY): Payer: Self-pay

## 2024-08-15 NOTE — Telephone Encounter (Signed)
 error

## 2024-09-08 DIAGNOSIS — M7918 Myalgia, other site: Secondary | ICD-10-CM | POA: Diagnosis not present

## 2024-10-10 ENCOUNTER — Other Ambulatory Visit: Payer: Self-pay | Admitting: Nurse Practitioner

## 2024-10-10 DIAGNOSIS — K219 Gastro-esophageal reflux disease without esophagitis: Secondary | ICD-10-CM

## 2024-10-11 NOTE — Telephone Encounter (Signed)
 Requested Prescriptions  Pending Prescriptions Disp Refills   omeprazole  (PRILOSEC) 20 MG capsule [Pharmacy Med Name: OMEPRAZOLE  DR 20 MG CAPSULE] 90 capsule 0    Sig: Take 1 capsule (20 mg total) by mouth daily.     Gastroenterology: Proton Pump Inhibitors Passed - 10/11/2024  5:24 PM      Passed - Valid encounter within last 12 months    Recent Outpatient Visits           1 month ago Depression, major, single episode, mild   [facility] [place], [place] T, NP   8 months ago Depression, major, single episode, mild   [facility] [place], Melanie DASEN, NP

## 2024-11-29 ENCOUNTER — Other Ambulatory Visit: Payer: Self-pay | Admitting: Nurse Practitioner

## 2025-02-23 ENCOUNTER — Encounter: Admitting: Nurse Practitioner

## 2025-08-04 ENCOUNTER — Ambulatory Visit
